# Patient Record
Sex: Female | Born: 1940 | Race: White | Hispanic: No | Smoking: Former smoker
Health system: Southern US, Community
[De-identification: ages and names within clinical notes are randomized; demographics above are authoritative.]

## PROBLEM LIST (undated history)

## (undated) DIAGNOSIS — Z85828 Personal history of other malignant neoplasm of skin: Secondary | ICD-10-CM

## (undated) DIAGNOSIS — I10 Essential (primary) hypertension: Secondary | ICD-10-CM

## (undated) DIAGNOSIS — F329 Major depressive disorder, single episode, unspecified: Secondary | ICD-10-CM

## (undated) DIAGNOSIS — M25861 Other specified joint disorders, right knee: Secondary | ICD-10-CM

## (undated) DIAGNOSIS — Z972 Presence of dental prosthetic device (complete) (partial): Secondary | ICD-10-CM

## (undated) DIAGNOSIS — E785 Hyperlipidemia, unspecified: Secondary | ICD-10-CM

## (undated) DIAGNOSIS — J45909 Unspecified asthma, uncomplicated: Secondary | ICD-10-CM

## (undated) DIAGNOSIS — K589 Irritable bowel syndrome without diarrhea: Secondary | ICD-10-CM

## (undated) DIAGNOSIS — Z9889 Other specified postprocedural states: Secondary | ICD-10-CM

## (undated) DIAGNOSIS — Z9109 Other allergy status, other than to drugs and biological substances: Secondary | ICD-10-CM

## (undated) DIAGNOSIS — Z87440 Personal history of urinary (tract) infections: Secondary | ICD-10-CM

## (undated) DIAGNOSIS — N393 Stress incontinence (female) (male): Secondary | ICD-10-CM

## (undated) DIAGNOSIS — L309 Dermatitis, unspecified: Secondary | ICD-10-CM

## (undated) DIAGNOSIS — J309 Allergic rhinitis, unspecified: Secondary | ICD-10-CM

## (undated) DIAGNOSIS — D494 Neoplasm of unspecified behavior of bladder: Secondary | ICD-10-CM

## (undated) DIAGNOSIS — M797 Fibromyalgia: Secondary | ICD-10-CM

## (undated) DIAGNOSIS — F419 Anxiety disorder, unspecified: Secondary | ICD-10-CM

## (undated) DIAGNOSIS — M1990 Unspecified osteoarthritis, unspecified site: Secondary | ICD-10-CM

## (undated) DIAGNOSIS — K219 Gastro-esophageal reflux disease without esophagitis: Secondary | ICD-10-CM

## (undated) DIAGNOSIS — M810 Age-related osteoporosis without current pathological fracture: Secondary | ICD-10-CM

## (undated) DIAGNOSIS — R112 Nausea with vomiting, unspecified: Secondary | ICD-10-CM

## (undated) DIAGNOSIS — F32A Depression, unspecified: Secondary | ICD-10-CM

## (undated) DIAGNOSIS — Z8673 Personal history of transient ischemic attack (TIA), and cerebral infarction without residual deficits: Secondary | ICD-10-CM

## (undated) HISTORY — DX: Major depressive disorder, single episode, unspecified: F32.9

## (undated) HISTORY — DX: Unspecified osteoarthritis, unspecified site: M19.90

## (undated) HISTORY — DX: Depression, unspecified: F32.A

## (undated) HISTORY — DX: Gastro-esophageal reflux disease without esophagitis: K21.9

## (undated) HISTORY — DX: Essential (primary) hypertension: I10

## (undated) HISTORY — PX: JOINT REPLACEMENT: SHX530

## (undated) HISTORY — PX: CATARACT EXTRACTION W/ INTRAOCULAR LENS  IMPLANT, BILATERAL: SHX1307

## (undated) HISTORY — PX: TONSILLECTOMY: SUR1361

## (undated) HISTORY — DX: Hyperlipidemia, unspecified: E78.5

---
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
## 1999-09-08 HISTORY — PX: KNEE ARTHROSCOPY: SUR90

## 2005-02-09 ENCOUNTER — Encounter: Payer: Self-pay | Admitting: Gastroenterology

## 2007-08-15 ENCOUNTER — Encounter: Payer: Self-pay | Admitting: Gastroenterology

## 2009-07-21 ENCOUNTER — Emergency Department (HOSPITAL_COMMUNITY): Admission: EM | Admit: 2009-07-21 | Discharge: 2009-07-22 | Payer: Self-pay | Admitting: Emergency Medicine

## 2009-08-06 ENCOUNTER — Ambulatory Visit: Payer: Self-pay | Admitting: Family Medicine

## 2009-08-06 DIAGNOSIS — F329 Major depressive disorder, single episode, unspecified: Secondary | ICD-10-CM

## 2009-08-06 DIAGNOSIS — R32 Unspecified urinary incontinence: Secondary | ICD-10-CM | POA: Insufficient documentation

## 2009-08-06 DIAGNOSIS — K219 Gastro-esophageal reflux disease without esophagitis: Secondary | ICD-10-CM | POA: Insufficient documentation

## 2009-08-06 DIAGNOSIS — I10 Essential (primary) hypertension: Secondary | ICD-10-CM | POA: Insufficient documentation

## 2009-08-06 DIAGNOSIS — Z8679 Personal history of other diseases of the circulatory system: Secondary | ICD-10-CM | POA: Insufficient documentation

## 2009-08-06 DIAGNOSIS — F32A Depression, unspecified: Secondary | ICD-10-CM | POA: Insufficient documentation

## 2009-08-06 DIAGNOSIS — E785 Hyperlipidemia, unspecified: Secondary | ICD-10-CM | POA: Insufficient documentation

## 2009-08-07 ENCOUNTER — Telehealth: Payer: Self-pay | Admitting: Family Medicine

## 2009-08-20 ENCOUNTER — Ambulatory Visit: Payer: Self-pay | Admitting: Family Medicine

## 2009-08-22 ENCOUNTER — Telehealth: Payer: Self-pay | Admitting: Family Medicine

## 2009-08-28 ENCOUNTER — Ambulatory Visit: Payer: Self-pay | Admitting: Family Medicine

## 2009-08-29 ENCOUNTER — Encounter: Payer: Self-pay | Admitting: Family Medicine

## 2009-09-02 ENCOUNTER — Encounter: Payer: Self-pay | Admitting: Family Medicine

## 2009-09-02 ENCOUNTER — Ambulatory Visit: Payer: Self-pay

## 2009-09-05 ENCOUNTER — Ambulatory Visit: Payer: Self-pay | Admitting: Family Medicine

## 2009-09-19 ENCOUNTER — Ambulatory Visit: Payer: Self-pay | Admitting: Family Medicine

## 2009-09-20 ENCOUNTER — Encounter (INDEPENDENT_AMBULATORY_CARE_PROVIDER_SITE_OTHER): Payer: Self-pay | Admitting: *Deleted

## 2009-09-25 ENCOUNTER — Telehealth: Payer: Self-pay | Admitting: Family Medicine

## 2009-09-30 ENCOUNTER — Encounter: Payer: Self-pay | Admitting: Family Medicine

## 2009-10-01 ENCOUNTER — Ambulatory Visit: Payer: Self-pay | Admitting: Family Medicine

## 2009-10-01 ENCOUNTER — Telehealth: Payer: Self-pay | Admitting: Family Medicine

## 2009-10-01 DIAGNOSIS — L240 Irritant contact dermatitis due to detergents: Secondary | ICD-10-CM | POA: Insufficient documentation

## 2009-10-03 ENCOUNTER — Telehealth (INDEPENDENT_AMBULATORY_CARE_PROVIDER_SITE_OTHER): Payer: Self-pay | Admitting: *Deleted

## 2009-10-03 LAB — CONVERTED CEMR LAB
Albumin: 3.9 g/dL (ref 3.5–5.2)
BUN: 11 mg/dL (ref 6–23)
Basophils Relative: 0.5 % (ref 0.0–3.0)
Bilirubin, Direct: 0 mg/dL (ref 0.0–0.3)
CO2: 30 meq/L (ref 19–32)
Calcium: 9.5 mg/dL (ref 8.4–10.5)
Chloride: 101 meq/L (ref 96–112)
Cholesterol: 136 mg/dL (ref 0–200)
Creatinine, Ser: 0.7 mg/dL (ref 0.4–1.2)
GFR calc non Af Amer: 87.8 mL/min (ref >60)
Glucose, Bld: 78 mg/dL (ref 70–99)
Iron: 61 ug/dL (ref 42–145)
LDL Cholesterol: 72 mg/dL (ref 0–99)
MCV: 96.5 fL (ref 78.0–100.0)
Neutro Abs: 5.4 10*3/uL (ref 1.4–7.7)
Platelets: 290 10*3/uL (ref 150.0–400.0)
Potassium: 3.9 meq/L (ref 3.5–5.1)
RBC: 3.89 M/uL (ref 3.87–5.11)
T3, Free: 2.6 pg/mL (ref 2.3–4.2)
Total Bilirubin: 0.4 mg/dL (ref 0.3–1.2)
Total Protein: 7 g/dL (ref 6.0–8.3)
VLDL: 14.8 mg/dL (ref 0.0–40.0)

## 2009-10-08 ENCOUNTER — Ambulatory Visit: Payer: Self-pay | Admitting: Gastroenterology

## 2009-10-08 LAB — HM COLONOSCOPY

## 2009-10-28 ENCOUNTER — Telehealth: Payer: Self-pay | Admitting: Family Medicine

## 2009-11-01 ENCOUNTER — Encounter: Payer: Self-pay | Admitting: Family Medicine

## 2009-11-05 ENCOUNTER — Ambulatory Visit: Payer: Self-pay | Admitting: Family Medicine

## 2010-01-23 ENCOUNTER — Telehealth: Payer: Self-pay | Admitting: *Deleted

## 2010-01-28 ENCOUNTER — Ambulatory Visit: Payer: Self-pay | Admitting: Family Medicine

## 2010-01-30 ENCOUNTER — Telehealth: Payer: Self-pay | Admitting: Internal Medicine

## 2010-02-06 ENCOUNTER — Ambulatory Visit (HOSPITAL_COMMUNITY): Admission: RE | Admit: 2010-02-06 | Discharge: 2010-02-06 | Payer: Self-pay | Admitting: Family Medicine

## 2010-02-06 LAB — HM MAMMOGRAPHY

## 2010-03-18 ENCOUNTER — Ambulatory Visit: Payer: Self-pay | Admitting: Family Medicine

## 2010-03-18 DIAGNOSIS — K1321 Leukoplakia of oral mucosa, including tongue: Secondary | ICD-10-CM | POA: Insufficient documentation

## 2010-10-05 LAB — CONVERTED CEMR LAB
BUN: 14 mg/dL (ref 6–23)
Basophils Relative: 0.4 % (ref 0.0–3.0)
CO2: 30 meq/L (ref 19–32)
Calcium: 9.6 mg/dL (ref 8.4–10.5)
Chloride: 100 meq/L (ref 96–112)
Cholesterol: 182 mg/dL (ref 0–200)
Eosinophils Relative: 5.5 % — ABNORMAL HIGH (ref 0.0–5.0)
Glucose, Bld: 76 mg/dL (ref 70–99)
HDL: 50.2 mg/dL (ref >39.00)
LDL Cholesterol: 110 mg/dL — ABNORMAL HIGH (ref 0–99)
Lymphocytes Relative: 32.4 % (ref 12.0–46.0)
Lymphs Abs: 2.3 10*3/uL (ref 0.7–4.0)
MCV: 93.9 fL (ref 78.0–100.0)
Monocytes Absolute: 0.6 10*3/uL (ref 0.1–1.0)
Neutro Abs: 3.9 10*3/uL (ref 1.4–7.7)
Neutrophils Relative %: 54.1 % (ref 43.0–77.0)
Platelets: 307 10*3/uL (ref 150.0–400.0)
Potassium: 4.5 meq/L (ref 3.5–5.1)
RBC: 4.14 M/uL (ref 3.87–5.11)
RDW: 14.7 % — ABNORMAL HIGH (ref 11.5–14.6)
TSH: 0.83 microintl units/mL (ref 0.35–5.50)
Total CHOL/HDL Ratio: 4
Total Protein: 6.9 g/dL (ref 6.0–8.3)

## 2010-10-07 NOTE — Assessment & Plan Note (Signed)
Summary: 2 wk rov/njr   Vital Signs:  Patient profile:   70 year old female Menstrual status:  hysterectomy Weight:      166 pounds Pulse rate:   94 / minute BP sitting:   112 / 64  (left arm)  Vitals Entered By: Doristine Devoid (September 19, 2009 10:23 AM) CC: 2 wk roa-bp   CC:  2 wk roa-bp.  History of Present Illness: Zafira is a 70 year old, married female, nonsmoker, who comes in today for evaluation of two problems.  She has underlying hypertension, which we had had some medication to get her blood pressure down.  We did a renal artery scan to be sure she did not have renal artery stenosis, which she does not area.  She is currently on Zestril 40 mg daily, hydrochlorothiazide 25 mg daily, amlodipine 5 mg b.i.d., potassium 20 mEq daily.  BP 112/64 to 3, times a week she's having lightheadedness when she stands up.  No side effects from medication.  She is also complaining of mid sternal burning.  It wakes her up at two or 3 o'clock in the morning.  She is also having difficulty swallowing and feels that food gets stuck in her distal esophagus.  She had a colonoscopy prior to moving to [facility] in [place], which was normal.  Allergies: 1)  ! Codeine 2)  ! Sulfa 3)  ! Cipro  Past History:  Past medical, surgical, family and social histories (including risk factors) reviewed for relevance to current acute and chronic problems.  Past Medical History: Reviewed history from 08/06/2009 and no changes required. Depression GERD Hyperlipidemia Hypertension Cerebrovascular accident, hx of Urinary incontinence arthritis chicken pox faiting spells ulcers arrhythmia hx uti  Past Surgical History: Reviewed history from 08/06/2009 and no changes required. Cataract extraction Hysterectomy right foot surgery lipoma on stomach  Family History: Reviewed history from 08/06/2009 and no changes required. Father: DM, heart disease, stroke Mother: pancreatic cancer - died  age 43 Siblings: none Children: 1 boy 1 girl - healthy  Social History: Reviewed history from 08/06/2009 and no changes required. Retired Married Alcohol use-yes Drug use-no  Review of Systems      See HPI  Physical Exam  General:  Well-developed,well-nourished,in no acute distress; alert,appropriate and cooperative throughout examination   Impression & Recommendations:  Problem # 1:  HYPERTENSION (ICD-401.9) Assessment Improved  Her updated medication list for this problem includes:    Zestril 40 Mg Tabs (Lisinopril) .Marland Kitchen... Take 1 tablet by mouth every morning    Amlodipine Besylate 5 Mg Tabs (Amlodipine besylate) .Marland Kitchen... Take 1 tablet by mouth two times a day    Hydrochlorothiazide 25 Mg Tabs (Hydrochlorothiazide) .Marland Kitchen... Take 1 tablet by mouth every morning  Orders: Prescription Created Electronically [phone])  Problem # 2:  GERD (ICD-530.81) Assessment: Deteriorated  Orders: Gastroenterology Referral (GI) Prescription Created Electronically [phone])  Complete Medication List: 1)  Plavix 75 Mg Tabs (Clopidogrel bisulfate) .... Take one tab once daily 2)  Lovastatin 40 Mg Tabs (Lovastatin) .... Take one tab once daily 3)  Aspir-low 81 Mg Tbec (Aspirin) .... Take one tab once daily 4)  Fluoxetine Hcl 20 Mg Caps (Fluoxetine hcl) .... Take one cap once daily 5)  Zestril 40 Mg Tabs (Lisinopril) .... Take 1 tablet by mouth every morning 6)  Amlodipine Besylate 5 Mg Tabs (Amlodipine besylate) .... Take 1 tablet by mouth two times a day 7)  Lorazepam 0.5 Mg Tabs (Lorazepam) .... Take one tab by mouth once daily 8)  Hydrochlorothiazide 25  Mg Tabs (Hydrochlorothiazide) .... Take 1 tablet by mouth every morning 9)  Tessalon Perles 100 Mg Caps (Benzonatate) .Marland Kitchen.. 1 or 2 three times a day as needed cough 10)  Potassium Chloride Cr 10 Meq Cr-caps (Potassium chloride) .... 2 by mouth qam  Patient Instructions: 1)  decrease the amlodipine to one tablet daily.  Check your blood pressure  in the morning daily.  The goal is to keep her systolic between 664 and 140 and diastolic between 80 and 90. 2)  Take OTC Prilosec 20 mg daily, to your food slowly avoid hotdogs, and steak, or other foods that are hard to chew.  Nothing to eat for two hours prior to bedtime elevate y  head with two pillows.  I will call GI  for consult

## 2010-10-07 NOTE — Progress Notes (Signed)
Summary: Triage (Call a Nurse)  Phone Note Outgoing Call   Summary of Call: Pt called Call A NURSE last night with itchy lesion on cheek and around eye.  Was advised to see Dr. Tawanna Cooler within 24 hours.  Called pt's home and left message to call for an appt today if symptomatic.  Call placed by: Lynann Beaver CMA,  October 01, 2009 8:40 AM Call placed to: Patient Summary of Call: Called pt to see if she needs an appt for itchy area on face that she called in to talk to Call A NURSE last night.  Left message to call us back for appt.  Original note will be scanned.  Initial call taken by: Lynann Beaver CMA,  October 01, 2009 8:41 AM  Follow-up for Phone Call        Phone call completed-----Pt called in and adv that she was asyptomatic (exp no symptoms at this time)  but would c/b for appt w/ Dr Tawanna Cooler if condition came back. Follow-up by: Debbra Riding,  October 01, 2009 8:45 AM

## 2010-10-07 NOTE — Procedures (Signed)
Summary: EGD & Diagnostic Colonoscopy/[facility]  EGD & Diagnostic Colonoscopy/[facility]   Imported By: Sherian Rein 10/10/2009 09:30:33  _____________________________________________________________________  External Attachment:    Type:   Image     Comment:   External Document

## 2010-10-07 NOTE — Progress Notes (Signed)
Summary: rx  Phone Note Refill Request Message from:  Fax from Pharmacy on Jan 23, 2010 11:56 AM  Refills Requested: Medication #1:  ZESTRIL 40 MG TABS Take 1 tablet by mouth every morning Initial call taken by: Kern Reap CMA Duncan Dull),  Jan 23, 2010 11:56 AM    Prescriptions: ZESTRIL 40 MG TABS (LISINOPRIL) Take 1 tablet by mouth every morning  #90 x 3   Entered by:   Kern Reap CMA (AAMA)   Authorized by:   Roderick Pee MD   Signed by:   Kern Reap CMA (AAMA) on 01/23/2010   Method used:   Faxed to ...       [facility] education [facility])       [address] [address]       [place], [place]  [address]       Ph: [phone]       Fax: [phone]   RxID:   [id number]

## 2010-10-07 NOTE — Letter (Signed)
Summary: [facility]   Imported By: Maryln Gottron 10/03/2009 11:27:19  _____________________________________________________________________  External Attachment:    Type:   Image     Comment:   External Document

## 2010-10-07 NOTE — Progress Notes (Signed)
Summary: REFILL REQ  Phone Note Call from Patient   Caller: Patient [phone] Reason for Call: Refill Medication Summary of Call: Pt would like to have a RX for meds: Hydrochlorothiazide 25 Mg Tabs  -and-  Potassium Chloride Cr 10 Meq Cr-caps   sent to [facility] (90 day supply).  Initial call taken by: Debbra Riding,  October 28, 2009 9:53 AM    Prescriptions: POTASSIUM CHLORIDE CR 10 MEQ CR-CAPS (POTASSIUM CHLORIDE) 2 by mouth qam  #200 x 3   Entered by:   Kern Reap CMA (AAMA)   Authorized by:   Roderick Pee MD   Signed by:   Kern Reap CMA (AAMA) on 10/28/2009   Method used:   Faxed to ...       [facility] education [facility])       [address] [address]       [place], [place]  [address]       Ph: [phone]       Fax: [phone]   RxID:   [id number] HYDROCHLOROTHIAZIDE 25 MG TABS (HYDROCHLOROTHIAZIDE) Take 1 tablet by mouth every morning  #100 x 3   Entered by:   Kern Reap CMA (AAMA)   Authorized by:   Roderick Pee MD   Signed by:   Kern Reap CMA (AAMA) on 10/28/2009   Method used:   Faxed to ...       [facility] Environmental education officer)       [address] [address]       [place], [place]  [address]       Ph: [phone]       Fax: [phone]   RxID:   [id number]

## 2010-10-07 NOTE — Assessment & Plan Note (Signed)
Summary: pt will come in fasting/njr/pt rsc/cjr   Vital Signs:  Patient profile:   70 year old female Menstrual status:  hysterectomy Height:      61 inches Weight:      161 pounds Temp:     98.5 degrees F oral BP sitting:   120 / 80  (left arm) Cuff size:   regular  Vitals Entered By: Kern Reap CMA Duncan Dull) (Jan 28, 2010 10:56 AM)  Primary Care Provider:  Kelle Darting, MD   History of Present Illness: Marie Harrison is a 70 year old, married female, nonsmoker, who comes in today for evaluation of multiple problems.  She takes lovastatin 40 mg daily for hyperlipidemia.  Will check lipid panel.  She takes Prozac 20 mg nightly for depression.  She takes Zestril 40 mg daily for hypertension, under poor thiazide 25 mg daily, an two, potassium supplements.  BP 120/80.  She takes flurazepam, .5 nightly, p.r.n. for anxiet she takes Prilosec 40 mg daily for reflux esophagitis.  She was also given Celebrex 10 mg daily for soreness in her foot.  She does see Dr. Toni Arthurs for foot evaluation.  Recommend she stop the Celebrex use Motri b.i.d.  She gets routine eye care.  Dental care does BSE monthly and gets any mammography.  Colonoscopy normal.  Seasonal flu 2010 Pneumovax 2006 shingles 2009.  We will give her a tetanus booster today.  Her past history social history, family history in detail.  No significant changes.  Her physical activities are limited because of her foot pain.  Her mood is good on medication.  Hearing normal ADLs.  Normal fall risk.  None home safety.  Normal height, weight, normal.  Visual acuity checked by ophthalmologist.  Appropriate labs today.  Allergies: 1)  ! Codeine 2)  ! Sulfa 3)  ! Cipro  Past History:  Past medical, surgical, family and social histories (including risk factors) reviewed, and no changes noted (except as noted below).  Past Medical History: Reviewed history from 10/08/2009 and no changes  required. Depression GERD Hyperlipidemia Hypertension Cerebrovascular accident, hx of Urinary incontinence arthritis chicken pox faiting spells ulcers arrhythmia hx uti   Past Surgical History: Reviewed history from 10/08/2009 and no changes required. Cataract extraction Hysterectomy right foot surgery  Family History: Reviewed history from 10/08/2009 and no changes required. Father: DM, heart disease, stroke Mother: pancreatic cancer - died age 56 Siblings: none Children: 1 boy 1 girl - healthy    Social History: Reviewed history from 10/08/2009 and no changes required. Retired Runner, broadcasting/film/video Married 2 children Alcohol use-yes Drug use-no   1 caffinated beverage a day  Review of Systems      See HPI  Physical Exam  General:  Well-developed,well-nourished,in no acute distress; alert,appropriate and cooperative throughout examination Head:  Normocephalic and atraumatic without obvious abnormalities. No apparent alopecia or balding. Eyes:  No corneal or conjunctival inflammation noted. EOMI. Perrla. Funduscopic exam benign, without hemorrhages, exudates or papilledema. Vision grossly normal. Ears:  External ear exam shows no significant lesions or deformities.  Otoscopic examination reveals clear canals, tympanic membranes are intact bilaterally without bulging, retraction, inflammation or discharge. Hearing is grossly normal bilaterally. Nose:  External nasal examination shows no deformity or inflammation. Nasal mucosa are pink and moist without lesions or exudates. Mouth:  Oral mucosa and oropharynx without lesions or exudates.  Teeth in good repair. Neck:  No deformities, masses, or tenderness noted. Chest Wall:  No deformities, masses, or tenderness noted. Breasts:  No mass, nodules, thickening, tenderness, bulging,  retraction, inflamation, nipple discharge or skin changes noted.   Lungs:  Normal respiratory effort, chest expands symmetrically. Lungs are clear to  auscultation, no crackles or wheezes. Heart:  Normal rate and regular rhythm. S1 and S2 normal without gallop, murmur, click, rub or other extra sounds. Abdomen:  Bowel sounds positive,abdomen soft and non-tender without masses, organomegaly or hernias noted. Rectal:  No external abnormalities noted. Normal sphincter tone. No rectal masses or tenderness. Genitalia:  Pelvic Exam:        External: normal female genitalia without lesions or masses        Vagina: normal without lesions or masses        Cervix: normal without lesions or masses        Adnexa: normal bimanual exam without masses or fullness        Uterus: normal by palpation        Pap smear: not performed Msk:  No deformity or scoliosis noted of thoracic or lumbar spine.   Pulses:  R and L carotid,radial,femoral,dorsalis pedis and posterior tibial pulses are full and equal bilaterally Extremities:  No clubbing, cyanosis, edema, or deformity noted with normal full range of motion of all joints.   Neurologic:  No cranial nerve deficits noted. Station and gait are normal. Plantar reflexes are down-going bilaterally. DTRs are symmetrical throughout. Sensory, motor and coordinative functions appear intact. Skin:  total body skin exam normal.  She has a very large S. K. on her left breast.  This darkly pigmented, however, is not changed in many years Cervical Nodes:  No lymphadenopathy noted Axillary Nodes:  No palpable lymphadenopathy Inguinal Nodes:  No significant adenopathy Psych:  Cognition and judgment appear intact. Alert and cooperative with normal attention span and concentration. No apparent delusions, illusions, hallucinations   Impression & Recommendations:  Problem # 1:  HYPERTENSION (ICD-401.9) Assessment Improved  Her updated medication list for this problem includes:    Zestril 40 Mg Tabs (Lisinopril) .Marland Kitchen... Take 1 tablet by mouth every morning    Hydrochlorothiazide 25 Mg Tabs (Hydrochlorothiazide) .Marland Kitchen... Take 1  tablet by mouth every morning  Orders: Venipuncture ([address]) TLB-Lipid Panel (80061-LIPID) TLB-BMP (Basic Metabolic Panel-BMET) (80048-METABOL) TLB-CBC Platelet - w/Differential (85025-CBCD) TLB-Hepatic/Liver Function Pnl (80076-HEPATIC) TLB-TSH (Thyroid Stimulating Hormone) (84443-TSH) UA Dipstick w/o Micro (automated)  (81003) EKG w/ Interpretation (93000)  Problem # 2:  HYPERLIPIDEMIA (ICD-272.4) Assessment: Improved  Her updated medication list for this problem includes:    Lovastatin 40 Mg Tabs (Lovastatin) .Marland Kitchen... Take one tab once daily  Orders: Venipuncture ([address]) TLB-Lipid Panel (80061-LIPID) TLB-BMP (Basic Metabolic Panel-BMET) (80048-METABOL) TLB-CBC Platelet - w/Differential (85025-CBCD) TLB-Hepatic/Liver Function Pnl (80076-HEPATIC) TLB-TSH (Thyroid Stimulating Hormone) (84443-TSH) UA Dipstick w/o Micro (automated)  (81003) EKG w/ Interpretation (93000)  Problem # 3:  GERD (ICD-530.81) Assessment: Improved  Her updated medication list for this problem includes:    Prilosec 40 Mg Cpdr (Omeprazole) ..... One tablet by mouth once daily  Orders: Venipuncture ([address]) TLB-Lipid Panel (80061-LIPID) TLB-BMP (Basic Metabolic Panel-BMET) (80048-METABOL) TLB-CBC Platelet - w/Differential (85025-CBCD) TLB-Hepatic/Liver Function Pnl (80076-HEPATIC) TLB-TSH (Thyroid Stimulating Hormone) (84443-TSH) UA Dipstick w/o Micro (automated)  (81003)  Problem # 4:  DEPRESSION (ICD-311) Assessment: Improved  Her updated medication list for this problem includes:    Fluoxetine Hcl 20 Mg Caps (Fluoxetine hcl) .Marland Kitchen... Take one cap once daily    Lorazepam 0.5 Mg Tabs (Lorazepam) .Marland Kitchen... Take one tab by mouth once daily as needed  Orders: Venipuncture (91478) TLB-Lipid Panel (80061-LIPID) TLB-BMP (Basic Metabolic Panel-BMET) (80048-METABOL) TLB-CBC Platelet -  w/Differential (85025-CBCD) TLB-Hepatic/Liver Function Pnl (80076-HEPATIC) TLB-TSH (Thyroid Stimulating Hormone)  (84443-TSH) UA Dipstick w/o Micro (automated)  (81003)  Problem # 5:  Preventive Health Care (ICD-V70.0) Assessment: Unchanged  Complete Medication List: 1)  Lovastatin 40 Mg Tabs (Lovastatin) .... Take one tab once daily 2)  Fluoxetine Hcl 20 Mg Caps (Fluoxetine hcl) .... Take one cap once daily 3)  Zestril 40 Mg Tabs (Lisinopril) .... Take 1 tablet by mouth every morning 4)  Lorazepam 0.5 Mg Tabs (Lorazepam) .... Take one tab by mouth once daily as needed 5)  Hydrochlorothiazide 25 Mg Tabs (Hydrochlorothiazide) .... Take 1 tablet by mouth every morning 6)  Tessalon Perles 100 Mg Caps (Benzonatate) .Marland Kitchen.. 1 or 2 three times a day as needed cough 7)  Potassium Chloride Cr 10 Meq Cr-caps (Potassium chloride) .... 2 by mouth qam 8)  Prilosec 40 Mg Cpdr (Omeprazole) .... One tablet by mouth once daily 9)  Celebrex 200 Mg Caps (Celecoxib) .... Take one tab by mouth once daily  Other Orders: TD Toxoids IM 7 YR + (10272) Admin 1st Vaccine (53664)  Patient Instructions: 1)  continue current medications with the exception of stopping the Celebrex take Motrin 600 mg twice a day with food.  Call Dr. Toni Arthurs for specialist for consultation 2)  Please schedule a follow-up appointment in 1 year. Prescriptions: PRILOSEC 40 MG CPDR (OMEPRAZOLE) one tablet by mouth once daily  #100 x 3   Entered and Authorized by:   Roderick Pee MD   Signed by:   Roderick Pee MD on 01/28/2010   Method used:   Print then Give to Patient   RxID:   [id number] POTASSIUM CHLORIDE CR 10 MEQ CR-CAPS (POTASSIUM CHLORIDE) 2 by mouth qam  #200 x 3   Entered and Authorized by:   Roderick Pee MD   Signed by:   Roderick Pee MD on 01/28/2010   Method used:   Print then Give to Patient   RxID:   [id number] HYDROCHLOROTHIAZIDE 25 MG TABS (HYDROCHLOROTHIAZIDE) Take 1 tablet by mouth every morning  #100 x 3   Entered and Authorized by:   Roderick Pee MD   Signed by:   Roderick Pee MD on 01/28/2010    Method used:   Print then Give to Patient   RxID:   [id number] LORAZEPAM 0.5 MG TABS (LORAZEPAM) take one tab by mouth once daily as needed  #100 x 3   Entered and Authorized by:   Roderick Pee MD   Signed by:   Roderick Pee MD on 01/28/2010   Method used:   Print then Give to Patient   RxID:   [id number] ZESTRIL 40 MG TABS (LISINOPRIL) Take 1 tablet by mouth every morning  #100 x 3   Entered and Authorized by:   Roderick Pee MD   Signed by:   Roderick Pee MD on 01/28/2010   Method used:   Print then Give to Patient   RxID:   [id number] FLUOXETINE HCL 20 MG CAPS (FLUOXETINE HCL) take one cap once daily  #100 x 3   Entered and Authorized by:   Roderick Pee MD   Signed by:   Roderick Pee MD on 01/28/2010   Method used:   Print then Give to Patient   RxID:   [id number] LOVASTATIN 40 MG TABS (LOVASTATIN) take one tab once daily  #100 x 3   Entered and Authorized by:   Roderick Pee MD  Signed by:   Roderick Pee MD on 01/28/2010   Method used:   Print then Give to Patient   RxID:   [id number]    Immunization History:  Pneumovax Immunization History:    Pneumovax:  historical (09/07/2004)  Zostavax History:    Zostavax # 1:  zostavax (09/08/2007)  Immunizations Administered:  Tetanus Vaccine:    Vaccine Type: Td    Site: right deltoid    Mfr: Sanofi Pasteur    Dose: 0.5 ml    Route: IM    Given by: Kern Reap CMA (AAMA)    Exp. Date: 09/20/2011    Lot #: [id number]    Physician counseled: yes   Appended Document: Orders Update    Clinical Lists Changes  Observations: Added new observation of COMMENTS: Marie Harrison, CMA  Jan 28, 2010 3:45 PM  (01/28/2010 15:44) Added new observation of PH URINE: 7.0  (01/28/2010 15:44) Added new observation of SPEC GR URIN: 1.015  (01/28/2010 15:44) Added new observation of APPEARANCE U: Clear  (01/28/2010 15:44) Added new observation of UA COLOR: yellow  (01/28/2010 15:44) Added  new observation of WBC DIPSTK U: trace  (01/28/2010 15:44) Added new observation of NITRITE URN: negative  (01/28/2010 15:44) Added new observation of UROBILINOGEN: 0.2  (01/28/2010 15:44) Added new observation of PROTEIN, URN: negative  (01/28/2010 15:44) Added new observation of BLOOD UR DIP: negative  (01/28/2010 15:44) Added new observation of KETONES URN: negative  (01/28/2010 15:44) Added new observation of BILIRUBIN UR: negative  (01/28/2010 15:44) Added new observation of GLUCOSE, URN: negative  (01/28/2010 15:44)      Laboratory Results   Urine Tests  Date/Time Recieved: Jan 28, 2010 3:45 PM  Date/Time Reported: Jan 28, 2010 3:45 PM   Routine Urinalysis   Color: yellow Appearance: Clear Glucose: negative   (Normal Range: Negative) Bilirubin: negative   (Normal Range: Negative) Ketone: negative   (Normal Range: Negative) Spec. Gravity: 1.015   (Normal Range: 1.003-1.035) Blood: negative   (Normal Range: Negative) pH: 7.0   (Normal Range: 5.0-8.0) Protein: negative   (Normal Range: Negative) Urobilinogen: 0.2   (Normal Range: 0-1) Nitrite: negative   (Normal Range: Negative) Leukocyte Esterace: trace   (Normal Range: Negative)    Comments: Marie Harrison, CMA  Jan 28, 2010 3:45 PM

## 2010-10-07 NOTE — Letter (Signed)
Summary: New Patient letter  [facility]  [address] [place], [place] [address]   Phone: [phone]  Fax: [phone]       09/20/2009 MRN: [id number]  Marie Harrison [address] [place], [place]  [address]  Dear Marie Harrison,  Welcome to the Gastroenterology Division at [facility].    You are scheduled to see Dr.  Rob Bunting on October 08, 2009 at 8:30am on the 3rd floor at [facility], [address]  We ask that you try to arrive at our office 15 minutes prior to your appointment time to allow for check-in.  We would like you to complete the enclosed self-administered evaluation form prior to your visit and bring it with you on the day of your appointment.  We will review it with you.  Also, please bring a complete list of all your medications or, if you prefer, bring the medication bottles and we will list them.  Please bring your insurance card so that we may make a copy of it.  If your insurance requires a referral to see a specialist, please bring your referral form from your primary care physician.  Co-payments are due at the time of your visit and may be paid by cash, check or credit card.     Your office visit will consist of a consult with your physician (includes a physical exam), any laboratory testing he/she may order, scheduling of any necessary diagnostic testing (e.g. x-ray, ultrasound, CT-scan), and scheduling of a procedure (e.g. Endoscopy, Colonoscopy) if required.  Please allow enough time on your schedule to allow for any/all of these possibilities.    If you cannot keep your appointment, please call [phone] to cancel or reschedule prior to your appointment date.  This allows [place] the opportunity to schedule an appointment for another patient in need of care.  If you do not cancel or reschedule by 5 p.m. the business day prior to your appointment date, you will be charged a $50.00 late cancellation/no-show fee.    Thank you for  choosing [facility] for your medical needs.  We appreciate the opportunity to care for you.  Please visit [place] at our website  to learn more about our practice.                     Sincerely,                                                             The Gastroenterology Division

## 2010-10-07 NOTE — Medication Information (Signed)
Summary: Potassium Chloride Approved/[facility]  Potassium Chloride Approved/[facility]   Imported By: Sherian Rein 11/07/2009 12:07:56  _____________________________________________________________________  External Attachment:    Type:   Image     Comment:   External Document

## 2010-10-07 NOTE — Assessment & Plan Note (Signed)
Summary: 1 month rov/njr pt rsc/njr   Vital Signs:  Patient profile:   70 year old female Menstrual status:  hysterectomy Weight:      165 pounds BMI:     32.34 Temp:     97.9 degrees F oral BP sitting:   126 / 76  (left arm) Cuff size:   regular  Vitals Entered By: Raechel Ache, RN (November 05, 2009 10:14 AM) CC: 1 mo ROV- still having itching on 1/2 dose prednisone.   Primary Care Provider:  Kelle Darting, MD  CC:  1 mo ROV- still having itching on 1/2 dose prednisone.Marland Kitchen  History of Present Illness: Marie Harrison is a 70 year old female, married, nonsmoker, who comes in today for evaluation of hypertension.  Contact dermatitis.  Left chest wall pain.  Her hypertension is treated with Zestril 40 mg daily, hydrochlorothiazide 25 mg daily one to 20 mEq potassium supplement daily.  BP at home 130 to 140 systolic, 70 to 80 diastolic.  She's had no more episodes when her pressure.  She jumps, high, except when she eats salt.  Advised to stay on a salt free diet.  Her contact dermatitis was treated with a tapering dose of prednisone.  On the first round of flared so she started all over again.  She is now down to 10 mg a day.  She was lifting and developed some left chest wall pain.  It's a constant, dull ache, and she points to the left axillary area as the source for her pain.  She has no cardiac or pulmonary symptoms  Allergies: 1)  ! Codeine 2)  ! Sulfa 3)  ! Cipro  Past History:  Past medical, surgical, family and social histories (including risk factors) reviewed for relevance to current acute and chronic problems.  Past Medical History: Reviewed history from 10/08/2009 and no changes required. Depression GERD Hyperlipidemia Hypertension Cerebrovascular accident, hx of Urinary incontinence arthritis chicken pox faiting spells ulcers arrhythmia hx uti   Past Surgical History: Reviewed history from 10/08/2009 and no changes required. Cataract  extraction Hysterectomy right foot surgery  Family History: Reviewed history from 10/08/2009 and no changes required. Father: DM, heart disease, stroke Mother: pancreatic cancer - died age 32 Siblings: none Children: 1 boy 1 girl - healthy    Social History: Reviewed history from 10/08/2009 and no changes required. 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  Today Lungs:  Normal respiratory effort, chest expands symmetrically. Lungs are clear to auscultation, no crackles or wheezes. Heart:  150/90 Skin:  Intact without suspicious lesions or rashes   Impression & Recommendations:  Problem # 1:  CONTACT DERMATITIS&OTHER ECZEMA DUE DETERGENTS (ICD-692.0) Assessment Improved  Her updated medication list for this problem includes:    Prednisone 20 Mg Tabs (Prednisone) ..... Uad  Problem # 2:  HYPERTENSION (ICD-401.9) Assessment: Improved  Her updated medication list for this problem includes:    Zestril 40 Mg Tabs (Lisinopril) .Marland Kitchen... Take 1 tablet by mouth every morning    Hydrochlorothiazide 25 Mg Tabs (Hydrochlorothiazide) .Marland Kitchen... Take 1 tablet by mouth every morning  Complete Medication List: 1)  Lovastatin 40 Mg Tabs (Lovastatin) .... Take one tab once daily 2)  Aspir-low 81 Mg Tbec (Aspirin) .... Take one tab  once daily 3)  Fluoxetine Hcl 20 Mg Caps (Fluoxetine hcl) .... Take one cap once daily 4)  Zestril 40 Mg Tabs (Lisinopril) .... Take 1 tablet by mouth every morning 5)  Lorazepam 0.5 Mg Tabs (Lorazepam) .... Take one tab by mouth once daily 6)  Hydrochlorothiazide 25 Mg Tabs (Hydrochlorothiazide) .... Take 1 tablet by mouth every morning 7)  Tessalon Perles 100 Mg Caps (Benzonatate) .Marland Kitchen.. 1  or 2 three times a day as needed cough 8)  Potassium Chloride Cr 10 Meq Cr-caps (Potassium chloride) .... 2 by mouth qam 9)  Prednisone 20 Mg Tabs (Prednisone) .... Uad 10)  Prilosec 40 Mg Cpdr (Omeprazole) .... One tablet by mouth once daily  Patient Instructions: 1)  add 10 mg of Claritin plain daily.  Next Monday decrease the prednisone to 10 mg Monday, Wednesday, Friday, for a two week taper.  Continue to take the plain  10 mg of Claritin daily for 4 to 6 weeks.  If y  symptoms recur or do not resolve call your dermatologist. 2)  Continue your blood pressure medication as outlined.  Check your blood pressure once a week since it is  back to normal

## 2010-10-07 NOTE — Progress Notes (Signed)
  Faxed lab results to Dr. Porfirio Mylar Dohmeier at fax # [phone].

## 2010-10-07 NOTE — Progress Notes (Signed)
Summary: Records received  Records received from [facility]. Forwarded to Dr. Christella Hartigan for review.

## 2010-10-07 NOTE — Miscellaneous (Signed)
Summary: recall colon  Clinical Lists Changes  Observations: Added new observation of COLONNXTDUE: 02/2015 (10/08/2009 9:06)  Appended Document: recall colon recall in EMR and IDX

## 2010-10-07 NOTE — Progress Notes (Signed)
Summary: UA results  Phone Note Call from Patient   Caller: Patient Call For: Roderick Pee MD Summary of Call: Pt given normal UA results. Initial call taken by: Lynann Beaver CMA,  Jan 30, 2010 10:37 AM

## 2010-10-07 NOTE — Assessment & Plan Note (Signed)
Summary: REVIEW BIOPSIE REPORT // RS   Vital Signs:  Patient profile:   70 year old female Menstrual status:  hysterectomy Weight:      158 pounds Temp:     98.6 degrees F oral BP sitting:   122 / 70  (left arm) Cuff size:   regular  Vitals Entered By: Kathrynn Speed CMA (March 18, 2010 9:09 AM) CC: Review biopise report, src   Primary Care Provider:  Kelle Darting, MD  CC:  Review biopise report and src.  History of Present Illness: Marie Harrison is a 70 year old, married female, nonsmoker, who comes in today to review the treatment plan given to her by an oral surgeon because of A. abnormal biopsy.  She had some white spots in her palate.  Biopsy showed some hyperkeratosis.  The recommending further therapy.  She would like to see an ENT for a second opinion recommended.  Dr. Narda Bonds  Current Medications (verified): 1)  Lovastatin 40 Mg Tabs (Lovastatin) .... Take One Tab Once Daily 2)  Fluoxetine Hcl 20 Mg Caps (Fluoxetine Hcl) .... Take One Cap Once Daily 3)  Zestril 40 Mg Tabs (Lisinopril) .... Take 1 Tablet By Mouth Every Morning 4)  Lorazepam 0.5 Mg Tabs (Lorazepam) .... Take One Tab By Mouth Once Daily As Needed 5)  Hydrochlorothiazide 25 Mg Tabs (Hydrochlorothiazide) .... Take 1 Tablet By Mouth Every Morning 6)  Tessalon Perles 100 Mg Caps (Benzonatate) .Marland Kitchen.. 1 or 2 Three Times A Day As Needed Cough 7)  Potassium Chloride Cr 10 Meq Cr-Caps (Potassium Chloride) .... 2 By Mouth Qam 8)  Prilosec 40 Mg Cpdr (Omeprazole) .... One Tablet By Mouth Once Daily 9)  Advil 200 Mg Tabs (Ibuprofen) .... Twice A Day 10)  Clarinex-D 24 Hour 5-240 Mg Xr24h-Tab (Desloratadine-Pseudoephedrine) .... Once A Day  Allergies (verified): 1)  ! Codeine 2)  ! Sulfa 3)  ! Cipro  Past History:  Past medical, surgical, family and social histories (including risk factors) reviewed for relevance to current acute and chronic problems.  Past Medical History: Reviewed history from 10/08/2009 and  no changes required. Depression GERD Hyperlipidemia Hypertension Cerebrovascular accident, hx of Urinary incontinence arthritis chicken pox faiting spells ulcers arrhythmia hx uti   Past Surgical History: Reviewed history from 10/08/2009 and no changes required. Cataract extraction Hysterectomy right foot surgery  Family History: Reviewed history from 10/08/2009 and no changes required. Father: DM, heart disease, stroke Mother: pancreatic cancer - died age 75 Siblings: none Children: 1 boy 1 girl - healthy    Social History: Reviewed history from 10/08/2009 and no changes required. 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 Take one tab once daily 2)  Fluoxetine Hcl 20 Mg Caps (Fluoxetine hcl) .... Take one cap once daily 3)  Zestril 40 Mg Tabs (Lisinopril) .... Take 1 tablet by mouth every morning 4)  Lorazepam 0.5 Mg Tabs (Lorazepam) .... Take one tab by mouth once daily as needed 5)  Hydrochlorothiazide 25 Mg Tabs (Hydrochlorothiazide) .... Take 1 tablet by mouth every morning 6)  Tessalon Perles 100 Mg Caps (Benzonatate) .Marland Kitchen.. 1 or 2 three times a day as needed cough 7)  Potassium Chloride Cr 10 Meq Cr-caps (Potassium chloride) .... 2 by mouth qam 8)  Prilosec 40 Mg Cpdr (Omeprazole) .... One tablet by mouth once daily 9)  Advil 200 Mg Tabs (Ibuprofen) .... Twice a day 10)  Clarinex-d 24 Hour 5-240 Mg Xr24h-tab (Desloratadine-pseudoephedrine) .... Once a day  Patient Instructions: 1)  call Dr. Narda Bonds for a consult

## 2010-10-07 NOTE — Assessment & Plan Note (Signed)
History of Present Illness Visit Type: consult  Primary GI MD: Rob Bunting MD Primary Provider: Kelle Darting, MD Requesting Provider: Kelle Darting, MD Chief Complaint: GERD History of Present Illness:      very pleasant 70 year old womanwho recently moved to the area from [place], [place].  She has had EGDs (2006) normal EGD per report.  SB biopsies were normal.  She has partial dentures, knows she has to chew slowly and well, but still has intermittent dysphagia to solids.    recently started taking prilosec again (was on it in the past) and this helps her gassiness and may also help a bit with the dysphagia like symptoms.  she doesn't have dramatic pyrosis or other more typical GERD symptoms on or off of proton pump inhibitor. Her dysphasia is to Solids only, not progressive.  Overall weight down a bit intentionally 10 pounds in a year.  Never hematemesis, did have some dark stools around time of celebrex 2 months ago.  NSAID type meds and COX 2 inhibitors cause indigestion.   Usually only takes tylenol for pains.  Her last colonoscopy was also in 2006,              Current Medications (verified): 1)  Lovastatin 40 Mg Tabs (Lovastatin) .... Take One Tab Once Daily 2)  Aspir-Low 81 Mg Tbec (Aspirin) .... Take One Tab Once Daily 3)  Fluoxetine Hcl 20 Mg Caps (Fluoxetine Hcl) .... Take One Cap Once Daily 4)  Zestril 40 Mg Tabs (Lisinopril) .... Take 1 Tablet By Mouth Every Morning 5)  Lorazepam 0.5 Mg Tabs (Lorazepam) .... Take One Tab By Mouth Once Daily 6)  Hydrochlorothiazide 25 Mg Tabs (Hydrochlorothiazide) .... Take 1 Tablet By Mouth Every Morning 7)  Tessalon Perles 100 Mg Caps (Benzonatate) .Marland Kitchen.. 1 or 2 Three Times A Day As Needed Cough 8)  Potassium Chloride Cr 10 Meq Cr-Caps (Potassium Chloride) .... 2 By Mouth Qam 9)  Prednisone 20 Mg Tabs (Prednisone) .... Uad 10)  Prilosec 40 Mg Cpdr (Omeprazole) .... One Tablet By Mouth Once Daily  Allergies (verified): 1)  !  Codeine 2)  ! Sulfa 3)  ! 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  All other review of systems was otherwise negative.   Vital Signs:  Patient profile:   70 year old female Menstrual status:  hysterectomy Height:      60 inches Weight:      162 pounds BMI:     31.75 BSA:     1.71 Pulse rate:   88 / minute Pulse rhythm:   regular BP sitting:   120 / 78  (left arm) Cuff size:   regular  Vitals Entered By: Ok Anis CMA (October 08, 2009 8:26 AM)  Physical Exam  Additional Exam:  Constitutional: generally well appearing Psychiatric: alert and oriented times 3 Eyes: extraocular movements intact Mouth: oropharynx moist, no lesions  Neck: supple, no lymphadenopathy Cardiovascular: heart regular rate and rythm Lungs: CTA bilaterally Abdomen: soft, non-tender, non-distended, no obvious ascites, no peritoneal signs, normal bowel sounds Extremities: no lower extremity edema bilaterally Skin: no lesions on visible extremities    Impression & Recommendations:  Problem # 1:  dysphasia, likely GERD related she had an EGD 5 years ago and it was completely normal. Her intermittent, nonprogressive, solid food only dysphasia has improved since starting proton pump inhibitors one week ago. I recommended that she stay on a proton pump  inhibitor once daily 20-30 minutes before breakfast meal for the next several weeks and she will return to see me at that point. Hopefully her dysphasia is simply from swelling, edema, perhaps mild inflammation of her esophagus. If she has any residual dysphasia after that 6 week trial of proton pump inhibitor then I would schedule her for EGD. Perhaps she needs dilation. She really has no alarm symptoms and I did not mention above but I did review her recent lab work that showed normal CBC, normal complete metabolic profile.  Problem # 2:  routine risk for colon cancer we will put her in our reminder system for a colonoscopy in 2016.  Patient Instructions: 1)  Needs colonoscopy for routine screening June, 2016 (routine risk). 2)  Continue your prilosec once daily as you are already taking it. 3)  Return to see Dr. Christella Hartigan in 6 weeks, if still with intermittent swallowing issue, will proceed with EGD. 4)  A copy of this information will be sent to Dr. Tawanna Cooler. 5)  The medication list was reviewed and reconciled.  All changed / newly prescribed medications were explained.  A complete medication list was provided to the patient / caregiver.

## 2010-10-07 NOTE — Progress Notes (Signed)
Summary: BP reading  Phone Note Call from Patient   Caller: Patient Call For: Roderick Pee MD Summary of Call: Pt is calling to document recent BP readings. 127/71, 126/71, 105/67, 116/72, 117/69, 110/67, 121/73. [phone] Initial call taken by: Lynann Beaver CMA,  September 25, 2009 9:30 AM  Follow-up for Phone Call        blood pressure too low.......... decrease the amlodipine to one half tablet q.a.m. BP checked daily.  Phone report in two weeks Follow-up by: Roderick Pee MD,  September 25, 2009 9:44 AM  Additional Follow-up for Phone Call Additional follow up Details #1::        Pt given Dr. Nelida Meuse recommendations. Additional Follow-up by: Lynann Beaver CMA,  September 25, 2009 10:06 AM

## 2010-10-07 NOTE — Assessment & Plan Note (Signed)
Summary: rash//ccm   Vital Signs:  Patient profile:   70 year old female Menstrual status:  hysterectomy Weight:      166 pounds Temp:     98.9 degrees F oral BP sitting:   118 / 74  (left arm) Cuff size:   regular  Vitals Entered By: Kern Reap CMA Duncan Dull) (October 01, 2009 12:36 PM)  Reason for Visit rash and blood pressure  History of Present Illness: Marie Harrison is a 70 year old female, who comes in today for evaluation of two problems.  Her blood pressure now is under excellent control, however, it's, now it's too low.  Her systolic is running 110 diastolic running 70.  A week ago.  We decreased her Norvasc from 5 mg daily to 2 1/2 milligrams.  Daily.  Her BP is still too low.  Last Saturday.  She used Neutrogena on her face.  She's been using this product now for a couple weeks.  She then developed swelling and redness and itching of her face.  she has also gone to a neurologist ........ the neurologist is requesting diagnostic labs, which we will do today, including B12 levels  Allergies: 1)  ! Codeine 2)  ! Sulfa 3)  ! Cipro  Past History:  Past medical, surgical, family and social histories (including risk factors) reviewed for relevance to current acute and chronic problems.  Past Medical History: Reviewed history from 08/06/2009 and no changes required. Depression GERD Hyperlipidemia Hypertension Cerebrovascular accident, hx of Urinary incontinence arthritis chicken pox faiting spells ulcers arrhythmia hx uti  Past Surgical History: Reviewed history from 08/06/2009 and no changes required. Cataract extraction Hysterectomy right foot surgery lipoma on stomach  Family History: Reviewed history from 08/06/2009 and no changes required. Father: DM, heart disease, stroke Mother: pancreatic cancer - died age 76 Siblings: none Children: 1 boy 1 girl - healthy  Social History: Reviewed history from 08/06/2009 and no changes  required. Retired Married Alcohol use-yes Drug use-no  Review of Systems      See HPI  Physical Exam  General:  Well-developed,well-nourished,in no acute distress; alert,appropriate and cooperative throughout examination Skin:  redness swelling, face, consistent with a contact dermatitis   Impression & Recommendations:  Problem # 1:  HYPERTENSION (ICD-401.9) Assessment Improved  Her updated medication list for this problem includes:    Zestril 40 Mg Tabs (Lisinopril) .Marland Kitchen... Take 1 tablet by mouth every morning    Amlodipine Besylate 5 Mg Tabs (Amlodipine besylate) .Marland Kitchen... Take 1 tablet by mouth once daily    Hydrochlorothiazide 25 Mg Tabs (Hydrochlorothiazide) .Marland Kitchen... Take 1 tablet by mouth every morning  Orders: Venipuncture (16109) TLB-BMP (Basic Metabolic Panel-BMET) (80048-METABOL) TLB-CBC Platelet - w/Differential (85025-CBCD) TLB-Hepatic/Liver Function Pnl (80076-HEPATIC) TLB-TSH (Thyroid Stimulating Hormone) (84443-TSH) TLB-B12 + Folate Pnl (60454_09811-B14/NWG) TLB-IBC Pnl (Iron/FE;Transferrin) (83550-IBC) TLB-T4 (Thyrox), Free [phone]) TLB-T3, Free (Triiodothyronine) (84481-T3FREE) Prescription Created Electronically [phone])  Problem # 2:  CONTACT DERMATITIS&OTHER ECZEMA DUE DETERGENTS (ICD-692.0) Assessment: New  Her updated medication list for this problem includes:    Prednisone 20 Mg Tabs (Prednisone) ..... Uad  Orders: Venipuncture (96295) TLB-BMP (Basic Metabolic Panel-BMET) (80048-METABOL) TLB-CBC Platelet - w/Differential (85025-CBCD) TLB-Hepatic/Liver Function Pnl (80076-HEPATIC) TLB-TSH (Thyroid Stimulating Hormone) (84443-TSH) TLB-B12 + Folate Pnl (28413_24401-U27/OZD) TLB-IBC Pnl (Iron/FE;Transferrin) (83550-IBC) TLB-T4 (Thyrox), Free [phone]) TLB-T3, Free (Triiodothyronine) [id number]) Prescription Created Electronically [phone])  Complete Medication List: 1)  Lovastatin 40 Mg Tabs (Lovastatin) .... Take one tab once daily 2)   Aspir-low 81 Mg Tbec (Aspirin) .... Take one tab once daily 3)  Fluoxetine Hcl  20 Mg Caps (Fluoxetine hcl) .... Take one cap once daily 4)  Zestril 40 Mg Tabs (Lisinopril) .... Take 1 tablet by mouth every morning 5)  Amlodipine Besylate 5 Mg Tabs (Amlodipine besylate) .... Take 1 tablet by mouth once daily 6)  Lorazepam 0.5 Mg Tabs (Lorazepam) .... Take one tab by mouth once daily 7)  Hydrochlorothiazide 25 Mg Tabs (Hydrochlorothiazide) .... Take 1 tablet by mouth every morning 8)  Tessalon Perles 100 Mg Caps (Benzonatate) .Marland Kitchen.. 1 or 2 three times a day as needed cough 9)  Potassium Chloride Cr 10 Meq Cr-caps (Potassium chloride) .... 2 by mouth qam 10)  Prednisone 20 Mg Tabs (Prednisone) .... Uad  Other Orders: TLB-Lipid Panel (80061-LIPID)  Patient Instructions: 1)  stop using Neutrogena.  Begin prednisone, take one tablet daily, x 3 days, a half a tablet x 3 days, then half a tablet Monday, Wednesday, Friday, for a two week taper. 2)  Stop the Norvasc.  Continue your other blood pressure medications.  Check a morning blood pressure daily.  Return p.r.n. Prescriptions: PREDNISONE 20 MG TABS (PREDNISONE) UAD  #30 x 1   Entered and Authorized by:   Roderick Pee MD   Signed by:   Roderick Pee MD on 10/01/2009   Method used:   Electronically to        [facility] [address] [phone]* (retail)       [address]       [place], [place]  [address]       Ph: [phone] or [phone]       Fax: [phone]   RxID:   [id number]

## 2010-10-31 ENCOUNTER — Other Ambulatory Visit: Payer: Self-pay | Admitting: *Deleted

## 2010-10-31 DIAGNOSIS — I10 Essential (primary) hypertension: Secondary | ICD-10-CM

## 2010-10-31 MED ORDER — LISINOPRIL 40 MG PO TABS: 40.0000 mg | ORAL_TABLET | Freq: Every day | ORAL | Status: DC

## 2010-12-10 LAB — BASIC METABOLIC PANEL
Chloride: 100 mEq/L (ref 96–112)
Potassium: 3.1 mEq/L — ABNORMAL LOW (ref 3.5–5.1)
Sodium: 137 mEq/L (ref 135–145)

## 2010-12-10 LAB — CBC
Hemoglobin: 13.5 g/dL (ref 12.0–15.0)
MCHC: 34.6 g/dL (ref 30.0–36.0)
MCV: 94.3 fL (ref 78.0–100.0)
RBC: 4.13 MIL/uL (ref 3.87–5.11)
RDW: 14.5 % (ref 11.5–15.5)
WBC: 10.2 10*3/uL (ref 4.0–10.5)

## 2010-12-10 LAB — URINALYSIS, ROUTINE W REFLEX MICROSCOPIC
Hgb urine dipstick: NEGATIVE
Ketones, ur: NEGATIVE mg/dL
Nitrite: NEGATIVE
Specific Gravity, Urine: 1.01 (ref 1.005–1.030)
Urobilinogen, UA: 0.2 mg/dL (ref 0.0–1.0)

## 2010-12-10 LAB — URINE MICROSCOPIC-ADD ON

## 2010-12-10 LAB — DIFFERENTIAL
Basophils Relative: 1 % (ref 0–1)
Lymphs Abs: 3.1 10*3/uL (ref 0.7–4.0)
Monocytes Absolute: 0.9 10*3/uL (ref 0.1–1.0)
Neutro Abs: 6 10*3/uL (ref 1.7–7.7)
Neutrophils Relative %: 58 % (ref 43–77)

## 2010-12-10 LAB — POCT CARDIAC MARKERS: Myoglobin, poc: 73.6 ng/mL (ref 12–200)

## 2011-02-06 ENCOUNTER — Encounter: Payer: Self-pay | Admitting: Family Medicine

## 2011-02-09 ENCOUNTER — Ambulatory Visit (INDEPENDENT_AMBULATORY_CARE_PROVIDER_SITE_OTHER): Payer: Medicare Other | Admitting: Family Medicine

## 2011-02-09 ENCOUNTER — Encounter: Payer: Self-pay | Admitting: Family Medicine

## 2011-02-09 DIAGNOSIS — F3289 Other specified depressive episodes: Secondary | ICD-10-CM

## 2011-02-09 DIAGNOSIS — I10 Essential (primary) hypertension: Secondary | ICD-10-CM

## 2011-02-09 DIAGNOSIS — E785 Hyperlipidemia, unspecified: Secondary | ICD-10-CM

## 2011-02-09 DIAGNOSIS — K219 Gastro-esophageal reflux disease without esophagitis: Secondary | ICD-10-CM

## 2011-02-09 DIAGNOSIS — F329 Major depressive disorder, single episode, unspecified: Secondary | ICD-10-CM

## 2011-02-09 LAB — LIPID PANEL
Cholesterol: 150 mg/dL (ref 0–200)
HDL: 57.4 mg/dL (ref >39.00)
Total CHOL/HDL Ratio: 3
Triglycerides: 93 mg/dL (ref 0.0–149.0)
VLDL: 18.6 mg/dL (ref 0.0–40.0)

## 2011-02-09 LAB — POCT URINALYSIS DIPSTICK
Bilirubin, UA: NEGATIVE
Blood, UA: NEGATIVE
Ketones, UA: NEGATIVE
Nitrite, UA: NEGATIVE
Urobilinogen, UA: 0.2

## 2011-02-09 LAB — HEPATIC FUNCTION PANEL
Alkaline Phosphatase: 60 U/L (ref 39–117)
Bilirubin, Direct: 0.1 mg/dL (ref 0.0–0.3)
Total Bilirubin: 0.5 mg/dL (ref 0.3–1.2)
Total Protein: 6.6 g/dL (ref 6.0–8.3)

## 2011-02-09 LAB — CBC WITH DIFFERENTIAL/PLATELET
Eosinophils Absolute: 0.4 10*3/uL (ref 0.0–0.7)
Lymphs Abs: 2.3 10*3/uL (ref 0.7–4.0)
Neutro Abs: 4 10*3/uL (ref 1.4–7.7)
RDW: 15.3 % — ABNORMAL HIGH (ref 11.5–14.6)

## 2011-02-09 LAB — BASIC METABOLIC PANEL
GFR: 98.77 mL/min (ref >60.00)
Potassium: 4.1 mEq/L (ref 3.5–5.1)

## 2011-02-09 MED ORDER — OMEPRAZOLE 40 MG PO CPDR: 40.0000 mg | DELAYED_RELEASE_CAPSULE | Freq: Every day | ORAL | Status: DC

## 2011-02-09 MED ORDER — FLUOXETINE HCL 20 MG PO CAPS: 20.0000 mg | ORAL_CAPSULE | Freq: Every day | ORAL | Status: DC

## 2011-02-09 MED ORDER — POTASSIUM CHLORIDE ER 10 MEQ PO CPCR: 10.0000 meq | ORAL_CAPSULE | Freq: Two times a day (BID) | ORAL | Status: DC

## 2011-02-09 MED ORDER — LOVASTATIN 40 MG PO TABS: 40.0000 mg | ORAL_TABLET | Freq: Every day | ORAL | Status: DC

## 2011-02-09 MED ORDER — HYDROCHLOROTHIAZIDE 25 MG PO TABS: 25.0000 mg | ORAL_TABLET | ORAL | Status: DC

## 2011-02-09 MED ORDER — LISINOPRIL 40 MG PO TABS: 40.0000 mg | ORAL_TABLET | Freq: Every day | ORAL | Status: DC

## 2011-02-09 MED ORDER — LORAZEPAM 0.5 MG PO TABS: 0.5000 mg | ORAL_TABLET | Freq: Every day | ORAL | Status: DC | PRN

## 2011-02-09 NOTE — Patient Instructions (Signed)
Follow-up in one year, sooner if any problems.  Continue your current medications

## 2011-02-09 NOTE — Progress Notes (Signed)
  Subjective:    Patient ID: Marie Harrison, female    DOB: July 15, 1941, 70 y.o.   MRN: [id number]  HPIcinstance is a 70 year old, married female, nonsmoker, who comes in today for Medicare wellness examination because of a history of underlying allergic rhinitis, depression, hypertension, hyperlipidemia, reflux, esophagitis,  Her problem list and her medications were reviewed in detail and there have been no significant changes except that she takes the Prilosec p.r.n. And the Ativan p.r.n.  She is not able to walk on a daily basis that she has chronic foot pain.  She's been to see the orthopedist, and she now has orthotics.  If this does not work then the next step would be to have an operation.  She also would like her skin checked thoroughly.  She has a knot on her scalp and she has a lot of gas.  Question lactose induced.  She gets routine eye care, dental care, BSE monthly, and a mammography, colonoscopy, normal, tetanus, 2011, Pneumovax 2006, shingles 2009  She's unable to walk on daily basis because of chronic foot pain.  Home safety was reviewed.  No issues.  There are guns in the house.  However, their locked.  She does have a healthcare power of attorney, and a living will.  Her cognitive function is normal.    Review of Systems  Constitutional: Negative.   HENT: Negative.   Eyes: Negative.   Respiratory: Negative.   Cardiovascular: Negative.   Gastrointestinal: Negative.   Genitourinary: Negative.   Musculoskeletal: Negative.   Neurological: Negative.   Hematological: Negative.   Psychiatric/Behavioral: Negative.        Objective:   Physical Exam  Constitutional: She appears well-developed and well-nourished.  HENT:  Head: Normocephalic and atraumatic.  Right Ear: External ear normal.  Left Ear: External ear normal.  Nose: Nose normal.  Mouth/Throat: Oropharynx is clear and moist.  Eyes: EOM are normal. Pupils are equal, round, and reactive to light.  Neck: Normal  range of motion. Neck supple. No thyromegaly present.  Cardiovascular: Normal rate, regular rhythm, normal heart sounds and intact distal pulses.  Exam reveals no gallop and no friction rub.   No murmur heard. Pulmonary/Chest: Effort normal and breath sounds normal.  Abdominal: Soft. Bowel sounds are normal. She exhibits no distension and no mass. There is no tenderness. There is no rebound.  Genitourinary: Vagina normal and uterus normal. Guaiac negative stool. No vaginal discharge found.       Bilateral breast exam normal  Musculoskeletal: Normal range of motion.  Lymphadenopathy:    She has no cervical adenopathy.  Neurological: She is alert. She has normal reflexes. No cranial nerve deficit. She exhibits normal muscle tone. Coordination normal.  Skin: Skin is warm and dry.  Psychiatric: She has a normal mood and affect. Her behavior is normal. Judgment and thought content normal.          Assessment & Plan:  Hypertension continue medication.  Hyperlipidemia.  Continue medications.  Depression.  Continue medication.  Reflux esophagitis.  Continue medication.  Allergic rhinitis take plain Claritin.  No D.  Bilateral foot pain.  Follow-up with orthopedics

## 2011-02-11 NOTE — Progress Notes (Signed)
Left message on machine for patient

## 2011-02-20 ENCOUNTER — Other Ambulatory Visit: Payer: Self-pay

## 2011-02-20 DIAGNOSIS — I10 Essential (primary) hypertension: Secondary | ICD-10-CM

## 2011-02-20 DIAGNOSIS — F329 Major depressive disorder, single episode, unspecified: Secondary | ICD-10-CM

## 2011-02-20 DIAGNOSIS — K219 Gastro-esophageal reflux disease without esophagitis: Secondary | ICD-10-CM

## 2011-02-20 DIAGNOSIS — F3289 Other specified depressive episodes: Secondary | ICD-10-CM

## 2011-02-20 DIAGNOSIS — E785 Hyperlipidemia, unspecified: Secondary | ICD-10-CM

## 2011-02-20 MED ORDER — LISINOPRIL 40 MG PO TABS: 40.0000 mg | ORAL_TABLET | Freq: Every day | ORAL | Status: DC

## 2011-02-20 MED ORDER — FLUOXETINE HCL 20 MG PO CAPS: 20.0000 mg | ORAL_CAPSULE | Freq: Every day | ORAL | Status: DC

## 2011-02-20 MED ORDER — HYDROCHLOROTHIAZIDE 25 MG PO TABS: 25.0000 mg | ORAL_TABLET | ORAL | Status: DC

## 2011-02-20 MED ORDER — POTASSIUM CHLORIDE ER 10 MEQ PO CPCR: 10.0000 meq | ORAL_CAPSULE | Freq: Two times a day (BID) | ORAL | Status: DC

## 2011-02-20 MED ORDER — OMEPRAZOLE 40 MG PO CPDR: 40.0000 mg | DELAYED_RELEASE_CAPSULE | Freq: Every day | ORAL | Status: DC

## 2011-02-20 MED ORDER — LORAZEPAM 0.5 MG PO TABS: 0.5000 mg | ORAL_TABLET | Freq: Every day | ORAL | Status: DC | PRN

## 2011-02-20 MED ORDER — LOVASTATIN 40 MG PO TABS: 40.0000 mg | ORAL_TABLET | Freq: Every day | ORAL | Status: DC

## 2011-02-20 NOTE — Telephone Encounter (Signed)
Pt called and stated that she needs all [facility] of [facility].  [facility] called and all rx have been cancelled there and sent to [facility]. Pt is aware.

## 2011-02-24 ENCOUNTER — Other Ambulatory Visit: Payer: Self-pay | Admitting: Family Medicine

## 2011-02-24 DIAGNOSIS — Z1231 Encounter for screening mammogram for malignant neoplasm of breast: Secondary | ICD-10-CM

## 2011-02-25 ENCOUNTER — Ambulatory Visit (HOSPITAL_COMMUNITY)
Admission: RE | Admit: 2011-02-25 | Discharge: 2011-02-25 | Disposition: A | Discharge status: Home / Self Care | Payer: Medicare Other | Source: Ambulatory Visit | Attending: Family Medicine | Admitting: Family Medicine

## 2011-02-25 DIAGNOSIS — Z1231 Encounter for screening mammogram for malignant neoplasm of breast: Secondary | ICD-10-CM | POA: Insufficient documentation

## 2011-09-04 ENCOUNTER — Telehealth: Payer: Self-pay | Admitting: Family Medicine

## 2011-09-04 DIAGNOSIS — F329 Major depressive disorder, single episode, unspecified: Secondary | ICD-10-CM

## 2011-09-04 DIAGNOSIS — F3289 Other specified depressive episodes: Secondary | ICD-10-CM

## 2011-09-04 MED ORDER — FLUOXETINE HCL 20 MG PO CAPS: 20.0000 mg | ORAL_CAPSULE | Freq: Every day | ORAL | Status: DC

## 2011-09-04 NOTE — Telephone Encounter (Signed)
Pt requesting a 30 day supply on FLUoxetine (PROZAC) 20 MG capsule  Call in to [facility] [address]

## 2011-11-02 ENCOUNTER — Ambulatory Visit (INDEPENDENT_AMBULATORY_CARE_PROVIDER_SITE_OTHER): Payer: Medicare Other | Admitting: Family Medicine

## 2011-11-02 ENCOUNTER — Telehealth: Payer: Self-pay | Admitting: *Deleted

## 2011-11-02 ENCOUNTER — Encounter: Payer: Self-pay | Admitting: Family Medicine

## 2011-11-02 DIAGNOSIS — J069 Acute upper respiratory infection, unspecified: Secondary | ICD-10-CM

## 2011-11-02 DIAGNOSIS — J45909 Unspecified asthma, uncomplicated: Secondary | ICD-10-CM

## 2011-11-02 MED ORDER — PREDNISONE 20 MG PO TABS: ORAL_TABLET | ORAL | Status: DC

## 2011-11-02 MED ORDER — HYDROCODONE-HOMATROPINE 5-1.5 MG/5ML PO SYRP: ORAL_SOLUTION | ORAL | Status: DC

## 2011-11-02 NOTE — Telephone Encounter (Signed)
Pt is calling for appt with Dr. Tawanna Cooler for viral infection.  Appt given.

## 2011-11-02 NOTE — Patient Instructions (Signed)
Drink lots of water  Run a vaporizer in your bedroom at night  Hydromet 1/2-1 teaspoon at bedtime when necessary for cough and cold  Prednisone use as directed return when necessary

## 2011-11-02 NOTE — Progress Notes (Signed)
  Subjective:    Patient ID: Marie Harrison, female    DOB: 1940-12-09, 71 y.o.   MRN: [id number]  HPI Marie Harrison is a 71 year old female who comes in with a five-day history of a cold  She said that condition sore throat and cough. No fever review of systems otherwise negative general and pulmonary review of systems otherwise negative   Review of Systems General and pulmonary review of systems otherwise negative    Objective:   Physical Exam Well-developed well-nourished female in no acute distress HEENT negative neck was supple no adenopathy lungs are clear except for some mild late expiratory wheezing       Assessment & Plan:  Viral syndrome with some mild asthma plan lots of liquids Hydromet short course of low-dose prednisone for the wheezing

## 2011-11-03 ENCOUNTER — Telehealth: Payer: Self-pay | Admitting: Family Medicine

## 2011-11-03 DIAGNOSIS — F3289 Other specified depressive episodes: Secondary | ICD-10-CM

## 2011-11-03 DIAGNOSIS — F329 Major depressive disorder, single episode, unspecified: Secondary | ICD-10-CM

## 2011-11-03 NOTE — Telephone Encounter (Signed)
Pt was in here yesterday and mentioned she needed another Rx for Ativan. She thought that meant you were going to call it in. She isn't sure whether you did. Should have gone to [facility]. Please call in. Thanks.

## 2011-11-03 NOTE — Telephone Encounter (Signed)
Ativan 0.5 dispense 30 tabs directions one by mouth each bedtime when necessary refills x4

## 2011-11-04 MED ORDER — LORAZEPAM 0.5 MG PO TABS: 0.5000 mg | ORAL_TABLET | Freq: Every day | ORAL | Status: DC | PRN

## 2011-11-04 NOTE — Telephone Encounter (Signed)
rx called in

## 2011-11-16 ENCOUNTER — Encounter: Payer: Self-pay | Admitting: Family Medicine

## 2011-11-16 ENCOUNTER — Ambulatory Visit (INDEPENDENT_AMBULATORY_CARE_PROVIDER_SITE_OTHER): Payer: Medicare Other | Admitting: Family Medicine

## 2011-11-16 DIAGNOSIS — J069 Acute upper respiratory infection, unspecified: Secondary | ICD-10-CM

## 2011-11-16 NOTE — Patient Instructions (Signed)
Take plain Claritin in the morning or plain Zyrtec at bedtime  Check a blood pressure in the morning and evening daily return in 4 weeks for followup with the data and the device

## 2011-11-16 NOTE — Progress Notes (Signed)
  Subjective:    Patient ID: Marie Harrison, female    DOB: 01-18-1941, 71 y.o.   MRN: [id number]  HPI Taniah is a 71 year old married female nonsmoker who comes in today for evaluation of 2 problems  She says she has pain in the left side of her face. She points to the left frontal sinus the left maxillary sinus in her left TMJ. She states it comes and goes. It's a dull ache. No illness of any kind no fever etc.  She takes lisinopril 40 mg daily BP 120/78 however she states her blood pressure in the evenings are gone up to 1 4150 systolic.   Review of Systems    general ENT and cardiovascular review of systems otherwise negative Objective:   Physical Exam  Well-developed well-nourished female in no acute distress HEENT negative neck was supple no adenopathy there might be some tenderness in the left TMJ it's very difficult to tell with her  BP 120/70 right arm sitting position    Assessment & Plan:  Allergic rhinitis question question plain Zyrtec or Claritin daily  Hypertension seemingly at goal plan BP check twice a day followup 4 weeks

## 2011-12-01 ENCOUNTER — Telehealth: Payer: Self-pay

## 2011-12-01 ENCOUNTER — Telehealth: Payer: Self-pay | Admitting: Family Medicine

## 2011-12-01 MED ORDER — FLUTICASONE PROPIONATE 50 MCG/ACT NA SUSP: 2.0000 | Freq: Every day | NASAL | Status: DC

## 2011-12-01 NOTE — Telephone Encounter (Signed)
I informed the patient and she states that she is going to urgent care to get an antibiotic

## 2011-12-01 NOTE — Telephone Encounter (Signed)
Patient has yellow thick drainage, body aches, cough, no fever, for about a month.  She is going out of town in 2 weeks.  She would like something called in if possible.  Maybe an antibiotic?

## 2011-12-01 NOTE — Telephone Encounter (Signed)
Patient called stating she has cold and sinus issues. No appts avail. Please assist

## 2011-12-01 NOTE — Telephone Encounter (Signed)
These are symptoms consistent with allergic rhinitis I recommend she take a plain Claritin 10 mg daily in the morning and we can add a steroid nasal spray Flonase one shot each nostril twice daily dispense one unit 6 refills

## 2011-12-01 NOTE — Telephone Encounter (Signed)
Pt states Dr. Tawanna Cooler had offered for pt to have a steroid nasal spray sent to [facility] [address].  Pls send rx or call pt to update.

## 2012-01-26 ENCOUNTER — Other Ambulatory Visit: Payer: Self-pay | Admitting: Family Medicine

## 2012-01-26 DIAGNOSIS — Z1231 Encounter for screening mammogram for malignant neoplasm of breast: Secondary | ICD-10-CM

## 2012-02-11 ENCOUNTER — Ambulatory Visit (INDEPENDENT_AMBULATORY_CARE_PROVIDER_SITE_OTHER): Payer: Medicare Other | Admitting: Family Medicine

## 2012-02-11 ENCOUNTER — Encounter: Payer: Self-pay | Admitting: Family Medicine

## 2012-02-11 VITALS — BP 110/70 | Temp 98.2°F | Ht 62.0 in | Wt 159.0 lb

## 2012-02-11 DIAGNOSIS — M1990 Unspecified osteoarthritis, unspecified site: Secondary | ICD-10-CM

## 2012-02-11 DIAGNOSIS — K219 Gastro-esophageal reflux disease without esophagitis: Secondary | ICD-10-CM

## 2012-02-11 DIAGNOSIS — I10 Essential (primary) hypertension: Secondary | ICD-10-CM

## 2012-02-11 DIAGNOSIS — Z0000 Encounter for general adult medical examination without abnormal findings: Secondary | ICD-10-CM

## 2012-02-11 DIAGNOSIS — F329 Major depressive disorder, single episode, unspecified: Secondary | ICD-10-CM

## 2012-02-11 DIAGNOSIS — R32 Unspecified urinary incontinence: Secondary | ICD-10-CM

## 2012-02-11 DIAGNOSIS — E785 Hyperlipidemia, unspecified: Secondary | ICD-10-CM

## 2012-02-11 DIAGNOSIS — J301 Allergic rhinitis due to pollen: Secondary | ICD-10-CM | POA: Insufficient documentation

## 2012-02-11 DIAGNOSIS — F3289 Other specified depressive episodes: Secondary | ICD-10-CM

## 2012-02-11 LAB — LIPID PANEL
Cholesterol: 138 mg/dL (ref 0–200)
HDL: 60.5 mg/dL (ref >39.00)
LDL Cholesterol: 68 mg/dL (ref 0–99)
Total CHOL/HDL Ratio: 2
Triglycerides: 48 mg/dL (ref 0.0–149.0)
VLDL: 9.6 mg/dL (ref 0.0–40.0)

## 2012-02-11 LAB — POCT URINALYSIS DIPSTICK
Bilirubin, UA: NEGATIVE
Blood, UA: NEGATIVE
Glucose, UA: NEGATIVE
Ketones, UA: NEGATIVE
Leukocytes, UA: NEGATIVE
Nitrite, UA: NEGATIVE
pH, UA: 8.5

## 2012-02-11 LAB — HEPATIC FUNCTION PANEL
Albumin: 4 g/dL (ref 3.5–5.2)
Total Protein: 6.7 g/dL (ref 6.0–8.3)

## 2012-02-11 LAB — CBC WITH DIFFERENTIAL/PLATELET
Basophils Absolute: 0.1 10*3/uL (ref 0.0–0.1)
Eosinophils Relative: 5.5 % — ABNORMAL HIGH (ref 0.0–5.0)
HCT: 38.6 % (ref 36.0–46.0)
Hemoglobin: 12.9 g/dL (ref 12.0–15.0)
Lymphocytes Relative: 28.2 % (ref 12.0–46.0)
Lymphs Abs: 2.1 10*3/uL (ref 0.7–4.0)
Monocytes Relative: 7.9 % (ref 3.0–12.0)
Neutro Abs: 4.2 10*3/uL (ref 1.4–7.7)
RBC: 4.01 Mil/uL (ref 3.87–5.11)
RDW: 15.1 % — ABNORMAL HIGH (ref 11.5–14.6)
WBC: 7.3 10*3/uL (ref 4.5–10.5)

## 2012-02-11 LAB — BASIC METABOLIC PANEL
Chloride: 105 mEq/L (ref 96–112)
GFR: 104.19 mL/min (ref >60.00)
Potassium: 3.8 mEq/L (ref 3.5–5.1)
Sodium: 141 mEq/L (ref 135–145)

## 2012-02-11 MED ORDER — HYDROCHLOROTHIAZIDE 25 MG PO TABS: 25.0000 mg | ORAL_TABLET | ORAL | Status: DC

## 2012-02-11 MED ORDER — OMEPRAZOLE 40 MG PO CPDR: 40.0000 mg | DELAYED_RELEASE_CAPSULE | Freq: Every day | ORAL | Status: DC

## 2012-02-11 MED ORDER — FLUTICASONE PROPIONATE 50 MCG/ACT NA SUSP: 2.0000 | Freq: Every day | NASAL | Status: DC

## 2012-02-11 MED ORDER — LISINOPRIL 40 MG PO TABS: 40.0000 mg | ORAL_TABLET | Freq: Every day | ORAL | Status: DC

## 2012-02-11 MED ORDER — LORAZEPAM 0.5 MG PO TABS: 0.5000 mg | ORAL_TABLET | Freq: Every day | ORAL | Status: DC | PRN

## 2012-02-11 MED ORDER — FLUOXETINE HCL 20 MG PO CAPS: 20.0000 mg | ORAL_CAPSULE | Freq: Every day | ORAL | Status: DC

## 2012-02-11 MED ORDER — POTASSIUM CHLORIDE ER 10 MEQ PO CPCR: 10.0000 meq | ORAL_CAPSULE | Freq: Two times a day (BID) | ORAL | Status: DC

## 2012-02-11 MED ORDER — LOVASTATIN 40 MG PO TABS: 40.0000 mg | ORAL_TABLET | Freq: Every day | ORAL | Status: DC

## 2012-02-11 NOTE — Patient Instructions (Signed)
Continue your current medications  Followup in 1 year sooner if any problems 

## 2012-02-11 NOTE — Progress Notes (Signed)
  Subjective:    Patient ID: Marie Harrison, female    DOB: [birth year], 71 y.o.   MRN: [id number]  HPI Marie Harrison is a 71 year old married female nonsmoker who comes in today for a Medicare wellness examination  Her medication list was reviewed in detail and there have been no changes. She does take plain Claritin and steroid nasal spray for allergic rhinitis  Prozac 20 mg each bedtime for mild depression  Hydrochlorothiazide and 40 mg of lisinopril daily for hypertension BP 110/70  Ativan 0.5 each bedtime for anxiety  Mevacor 40 mg daily for hyperlipidemia  OTC anti-inflammatories for knee and foot pain she's going to see the foot doctor because she can't walk more than 10 minutes at a time because of pain in her left foot.  Prilosec 40 mg daily when necessary for reflux and 20 mEq of potassium daily  She gets routine eye care, dental care, and you mammography, recent colonoscopy normal BSE occasionally  Tetanus 2011, Pneumovax 2006, shingles 2009  Cognitive function normal she can't walk because of foot pain home health safety reviewed no issues identified, no guns in the house, she does have a health care power of attorney and living well   Review of Systems  Constitutional: Negative.   HENT: Negative.   Eyes: Negative.   Respiratory: Negative.   Cardiovascular: Negative.   Gastrointestinal: Negative.   Genitourinary: Negative.   Musculoskeletal: Negative.   Neurological: Negative.   Hematological: Negative.   Psychiatric/Behavioral: Negative.        Objective:   Physical Exam  Constitutional: She appears well-developed and well-nourished.  HENT:  Head: Normocephalic and atraumatic.  Right Ear: External ear normal.  Left Ear: External ear normal.  Nose: Nose normal.  Mouth/Throat: Oropharynx is clear and moist.  Eyes: EOM are normal. Pupils are equal, round, and reactive to light.  Neck: Normal range of motion. Neck supple. No thyromegaly present.    Cardiovascular: Normal rate, regular rhythm, normal heart sounds and intact distal pulses.  Exam reveals no gallop and no friction rub.   No murmur heard. Pulmonary/Chest: Effort normal and breath sounds normal.  Abdominal: Soft. Bowel sounds are normal. She exhibits no distension and no mass. There is no tenderness. There is no rebound.  Genitourinary:       Bilateral breast exam normal  Musculoskeletal: Normal range of motion.  Lymphadenopathy:    She has no cervical adenopathy.  Neurological: She is alert. She has normal reflexes. No cranial nerve deficit. She exhibits normal muscle tone. Coordination normal.  Skin: Skin is warm and dry.       Total body skin exam normal except for a 15 mm x 15 mm seborrheic keratosis on her left breast  Psychiatric: She has a normal mood and affect. Her behavior is normal. Judgment and thought content normal.          Assessment & Plan:  Healthy female  Allergic rhinitis continue Claritin and steroid nasal spray  Depression continue Prozac 20 mg daily  Hypertension continue lisinopril 40 mg daily and hydrochlorothiazide 25 mg daily  Anxiety continue Ativan 0.5 each bedtime when necessary  Hyperlipidemia continue Mevacor 40 mg daily  Osteoarthritis orthopedic consult ASAP  Reflux esophagitis continue Prilosec 20 mg daily  History of low potassium secondary to diuretics continue potassium 20 mEq daily

## 2012-02-23 ENCOUNTER — Other Ambulatory Visit: Payer: Self-pay | Admitting: *Deleted

## 2012-02-23 DIAGNOSIS — K219 Gastro-esophageal reflux disease without esophagitis: Secondary | ICD-10-CM

## 2012-02-23 DIAGNOSIS — F3289 Other specified depressive episodes: Secondary | ICD-10-CM

## 2012-02-23 DIAGNOSIS — E785 Hyperlipidemia, unspecified: Secondary | ICD-10-CM

## 2012-02-23 DIAGNOSIS — F329 Major depressive disorder, single episode, unspecified: Secondary | ICD-10-CM

## 2012-02-23 DIAGNOSIS — I10 Essential (primary) hypertension: Secondary | ICD-10-CM

## 2012-02-23 MED ORDER — HYDROCHLOROTHIAZIDE 25 MG PO TABS: 25.0000 mg | ORAL_TABLET | ORAL | Status: DC

## 2012-02-23 MED ORDER — LISINOPRIL 40 MG PO TABS: 40.0000 mg | ORAL_TABLET | Freq: Every day | ORAL | Status: DC

## 2012-02-23 MED ORDER — POTASSIUM CHLORIDE ER 10 MEQ PO CPCR: 10.0000 meq | ORAL_CAPSULE | Freq: Two times a day (BID) | ORAL | Status: DC

## 2012-02-23 MED ORDER — LOVASTATIN 40 MG PO TABS: 40.0000 mg | ORAL_TABLET | Freq: Every day | ORAL | Status: DC

## 2012-02-23 MED ORDER — OMEPRAZOLE 40 MG PO CPDR: 40.0000 mg | DELAYED_RELEASE_CAPSULE | Freq: Every day | ORAL | Status: DC

## 2012-02-23 MED ORDER — FLUTICASONE PROPIONATE 50 MCG/ACT NA SUSP: 2.0000 | Freq: Every day | NASAL | Status: DC

## 2012-02-23 MED ORDER — FLUOXETINE HCL 20 MG PO CAPS: 20.0000 mg | ORAL_CAPSULE | Freq: Every day | ORAL | Status: DC

## 2012-02-25 ENCOUNTER — Other Ambulatory Visit: Payer: Self-pay | Admitting: Family Medicine

## 2012-02-26 ENCOUNTER — Ambulatory Visit (HOSPITAL_COMMUNITY)
Admission: RE | Admit: 2012-02-26 | Discharge: 2012-02-26 | Disposition: A | Discharge status: Home / Self Care | Payer: Medicare Other | Source: Ambulatory Visit | Attending: Family Medicine | Admitting: Family Medicine

## 2012-02-26 DIAGNOSIS — Z1231 Encounter for screening mammogram for malignant neoplasm of breast: Secondary | ICD-10-CM | POA: Insufficient documentation

## 2012-11-01 ENCOUNTER — Ambulatory Visit (INDEPENDENT_AMBULATORY_CARE_PROVIDER_SITE_OTHER): Payer: Medicare Other | Admitting: Family Medicine

## 2012-11-01 ENCOUNTER — Encounter: Payer: Self-pay | Admitting: Family Medicine

## 2012-11-01 VITALS — BP 120/84 | Temp 98.7°F | Wt 160.0 lb

## 2012-11-01 DIAGNOSIS — I10 Essential (primary) hypertension: Secondary | ICD-10-CM

## 2012-11-01 DIAGNOSIS — M1990 Unspecified osteoarthritis, unspecified site: Secondary | ICD-10-CM

## 2012-11-01 LAB — BASIC METABOLIC PANEL
CO2: 32 mEq/L (ref 19–32)
Calcium: 9.4 mg/dL (ref 8.4–10.5)
Creatinine, Ser: 0.6 mg/dL (ref 0.4–1.2)
GFR: 98.29 mL/min (ref >60.00)
Glucose, Bld: 84 mg/dL (ref 70–99)
Sodium: 135 mEq/L (ref 135–145)

## 2012-11-01 LAB — CBC WITH DIFFERENTIAL/PLATELET
Basophils Absolute: 0 10*3/uL (ref 0.0–0.1)
HCT: 39 % (ref 36.0–46.0)
Lymphs Abs: 2.7 10*3/uL (ref 0.7–4.0)
MCHC: 33.1 g/dL (ref 30.0–36.0)
MCV: 94 fl (ref 78.0–100.0)
Monocytes Absolute: 0.6 10*3/uL (ref 0.1–1.0)
Platelets: 299 10*3/uL (ref 150.0–400.0)
RDW: 14.9 % — ABNORMAL HIGH (ref 11.5–14.6)

## 2012-11-01 NOTE — Progress Notes (Signed)
  Subjective:    Patient ID: Euna Armon, female    DOB: [birth year], 72 y.o.   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 She had a shot in her right knee on January of this year however didn't seem to do much good. She's scheduled for an MRI and a followup office visit. She's been taking 3 Aleve daily. I explained to her that she cannot take that much NSAIDs since she's taken an ACE inhibitor it could cause renal failure.   Review of Systems    review of systems negative Objective:   Physical Exam Well-developed well-nourished female no acute distress lower extremities appear normal both knees are swollen not red or hot skin normal       Assessment & Plan:  Degenerative joint disease plan check renal function limit NSAIDs to Motrin 400 mg twice a day stopped the Anaprox stop the Aleve

## 2012-11-01 NOTE — Patient Instructions (Signed)
For your pain you could safely take 2 Tylenol 3 times daily or 400 mg of Motrin twice daily with food and no more  Do not take Aleve or anaprox   Renal function today will call you about your lab work tomorrow

## 2012-11-02 ENCOUNTER — Encounter: Payer: Self-pay | Admitting: Family Medicine

## 2012-11-15 ENCOUNTER — Other Ambulatory Visit: Payer: Self-pay | Admitting: Orthopedic Surgery

## 2012-11-15 MED ORDER — DEXAMETHASONE SODIUM PHOSPHATE 10 MG/ML IJ SOLN: 10.0000 mg | Freq: Once | INTRAMUSCULAR | Status: DC

## 2012-11-15 NOTE — Progress Notes (Signed)
Preoperative surgical orders have been place into the Epic hospital system for [facility] on 11/15/2012, 11:22 AM  by Patrica Duel for surgery on 12/07/2012.  Preop Knee Scope orders including IV Tylenol and IV Decadron as long as there are no contraindications to the above medications. Avel Peace, PA-C

## 2012-11-25 ENCOUNTER — Encounter (HOSPITAL_COMMUNITY): Payer: Self-pay | Admitting: Pharmacy Technician

## 2012-11-30 ENCOUNTER — Encounter (HOSPITAL_COMMUNITY): Payer: Self-pay

## 2012-11-30 ENCOUNTER — Ambulatory Visit (HOSPITAL_COMMUNITY)
Admission: RE | Admit: 2012-11-30 | Discharge: 2012-11-30 | Disposition: A | Discharge status: Home / Self Care | Payer: Medicare Other | Source: Ambulatory Visit | Attending: Orthopedic Surgery | Admitting: Orthopedic Surgery

## 2012-11-30 ENCOUNTER — Encounter (HOSPITAL_COMMUNITY)
Admission: RE | Admit: 2012-11-30 | Discharge: 2012-11-30 | Disposition: A | Discharge status: Home / Self Care | Payer: Medicare Other | Source: Ambulatory Visit | Attending: Orthopedic Surgery | Admitting: Orthopedic Surgery

## 2012-11-30 DIAGNOSIS — I10 Essential (primary) hypertension: Secondary | ICD-10-CM | POA: Insufficient documentation

## 2012-11-30 DIAGNOSIS — Z01812 Encounter for preprocedural laboratory examination: Secondary | ICD-10-CM | POA: Insufficient documentation

## 2012-11-30 DIAGNOSIS — Z01818 Encounter for other preprocedural examination: Secondary | ICD-10-CM | POA: Insufficient documentation

## 2012-11-30 DIAGNOSIS — Z87891 Personal history of nicotine dependence: Secondary | ICD-10-CM | POA: Insufficient documentation

## 2012-11-30 HISTORY — DX: Other specified postprocedural states: R11.2

## 2012-11-30 HISTORY — DX: Other specified postprocedural states: Z98.890

## 2012-11-30 LAB — CBC
HCT: 38.2 % (ref 36.0–46.0)
MCH: 31 pg (ref 26.0–34.0)
MCV: 92.5 fL (ref 78.0–100.0)
Platelets: 315 10*3/uL (ref 150–400)
RBC: 4.13 MIL/uL (ref 3.87–5.11)
WBC: 8.9 10*3/uL (ref 4.0–10.5)

## 2012-11-30 LAB — BASIC METABOLIC PANEL
BUN: 12 mg/dL (ref 6–23)
CO2: 31 mEq/L (ref 19–32)
Calcium: 9.4 mg/dL (ref 8.4–10.5)
Chloride: 96 mEq/L (ref 96–112)
Creatinine, Ser: 0.59 mg/dL (ref 0.50–1.10)
Glucose, Bld: 101 mg/dL — ABNORMAL HIGH (ref 70–99)

## 2012-11-30 NOTE — Pre-Procedure Instructions (Signed)
EKG REPORT IN EPIC FROM 02/11/12. CXR WAS DONE TODAY - PREOP - AT [facility].

## 2012-11-30 NOTE — Patient Instructions (Signed)
YOUR SURGERY IS SCHEDULED AT [facility]  ON:  WED  4/2  REPORT TO [facility] AT:  7:30 AM      PHONE # FOR SHORT STAY IS [phone]  DO NOT EAT OR DRINK ANYTHING AFTER MIDNIGHT THE NIGHT BEFORE YOUR SURGERY.  YOU MAY BRUSH YOUR TEETH, RINSE OUT YOUR MOUTH--BUT NO WATER, NO FOOD, NO CHEWING GUM, NO MINTS, NO CANDIES, NO CHEWING TOBACCO.  PLEASE TAKE THE FOLLOWING MEDICATIONS THE AM OF YOUR SURGERY WITH A FEW SIPS OF WATER:  ALPRAZOLAM IF NEEDED FOR ANXIETY , TAKE FLUOXETINE  IF YOU USE INHALERS--USE YOUR INHALERS THE AM OF YOUR SURGERY AND BRING INHALERS TO THE HOSPITAL.    IF YOU ARE DIABETIC:  DO NOT TAKE ANY DIABETIC MEDICATIONS THE AM OF YOUR SURGERY.  IF YOU TAKE INSULIN IN THE EVENINGS--PLEASE ONLY TAKE 1/2 NORMAL EVENING DOSE THE NIGHT BEFORE YOUR SURGERY.  NO INSULIN THE AM OF YOUR SURGERY.  IF YOU HAVE SLEEP APNEA AND USE CPAP OR BIPAP--PLEASE BRING THE MASK AND THE TUBING.  DO NOT BRING YOUR MACHINE.  DO NOT BRING VALUABLES, MONEY, CREDIT CARDS.  DO NOT WEAR JEWELRY, MAKE-UP, NAIL POLISH AND NO METAL PINS OR CLIPS IN YOUR HAIR. CONTACT LENS, DENTURES / PARTIALS, GLASSES SHOULD NOT BE WORN TO SURGERY AND IN MOST CASES-HEARING AIDS WILL NEED TO BE REMOVED.  BRING YOUR GLASSES CASE, ANY EQUIPMENT NEEDED FOR YOUR CONTACT LENS. FOR PATIENTS ADMITTED TO THE HOSPITAL--CHECK OUT TIME THE DAY OF DISCHARGE IS 11:00 AM.  ALL INPATIENT ROOMS ARE PRIVATE - WITH BATHROOM, TELEPHONE, TELEVISION AND WIFI INTERNET.  IF YOU ARE BEING DISCHARGED THE SAME DAY OF YOUR SURGERY--YOU CAN NOT DRIVE YOURSELF HOME--AND SHOULD NOT GO HOME ALONE BY TAXI OR BUS.  NO DRIVING OR OPERATING MACHINERY FOR 24 HOURS FOLLOWING ANESTHESIA / PAIN MEDICATIONS.  PLEASE MAKE ARRANGEMENTS FOR SOMEONE TO BE WITH YOU AT HOME THE FIRST 24 HOURS AFTER SURGERY. RESPONSIBLE DRIVER'S NAME  CLYDE Kintz - PT'S HUSBAND                                               PHONE #   [phone]            PLEASE READ OVER ANY  FACT SHEETS THAT YOU WERE GIVEN: MRSA INFORMATION, BLOOD TRANSFUSION INFORMATION, INCENTIVE SPIROMETER INFORMATION. FAILURE TO FOLLOW THESE INSTRUCTIONS MAY RESULT IN THE CANCELLATION OF YOUR SURGERY.   PATIENT SIGNATURE_________________________________

## 2012-12-06 DIAGNOSIS — S83249A Other tear of medial meniscus, current injury, unspecified knee, initial encounter: Secondary | ICD-10-CM

## 2012-12-06 NOTE — H&P (Signed)
CC- Marie Harrison is a 72 y.o. female who presents with right knee pain.  HPI- . Knee Pain: Patient presents with knee pain involving the  right knee. Onset of the symptoms was several months ago. Inciting event: none known. Current symptoms include giving out, pain located medially, stiffness and swelling. Pain is aggravated by kneeling, pivoting, rising after sitting and squatting.  Patient has had no prior knee problems. Evaluation to date: MRI: abnormal medial meniscal tear. Treatment to date: corticosteroid injection which was not very effective.  Past Medical History  Diagnosis Date  . Depression   . GERD (gastroesophageal reflux disease)   . Hyperlipidemia   . Hypertension   . Urinary incontinence   . Arthritis   . Chicken pox   . Fainting spell     SEVERAL YRS AGO--PT STATES HER POTASSIUM WAS LOW AT THE TIME--NO PROBLEMS SINCE AND TAKES DAILY POTASSIUM  . Ulcers of yaws   . Hx: UTI (urinary tract infection)     NO PROBLEMS IN LAST 2 YRS  . Arrhythmia     EPISODE OF IRREG HB-WENT TO ER--AND RELEASED - STATES NO PROBLEMS FOUND  . Cerebrovascular accident     EVIDENCE OF STROKE PER CT OF HEAD DONE AS FOLLOW UP OF IRREGULAR HB--PT DOES NOT KNOW OF ANY SPECIFIC INCIDENT OF HAVING STROKE  . Pain     RIGHT KNEE--MENISCAL TEAR; STATES SOME DISCOMFORT IN LEFT KNEE  . PONV (postoperative nausea and vomiting)   . Left foot pain     FALLEN INSTEP-WEARS ORTHOTIC IN HER SHOE AND HAS DISCOMFORT IF SHE DOES A LOT OF WALKING    Past Surgical History  Procedure Laterality Date  . Cataract extraction    . Right foot surgery    . Dilation and curettage of uterus    . Tonsillectomy      Prior to Admission medications   Medication Sig Start Date End Date Taking? Authorizing Provider  ALPRAZolam Prudy Feeler) 0.5 MG tablet Take 0.5 mg by mouth daily as needed for anxiety.    Historical Provider, MD  aspirin EC 81 MG tablet Take 81 mg by mouth daily.    Historical Provider, MD  FLUoxetine  (PROZAC) 20 MG capsule Take 20 mg by mouth every morning.    Historical Provider, MD  fluticasone (FLONASE) 50 MCG/ACT nasal spray Place 2 sprays into the nose daily as needed for allergies (or cold).    Historical Provider, MD  hydrochlorothiazide (HYDRODIURIL) 25 MG tablet Take 25 mg by mouth every morning.    Historical Provider, MD  Hypromellose (SYSTANE OVERNIGHT THERAPY) 0.3 % GEL Place 1 drop into both eyes daily as needed (for dry eyes).    Historical Provider, MD  ibuprofen (ADVIL,MOTRIN) 200 MG tablet Take 200-400 mg by mouth every morning.    Historical Provider, MD  lisinopril (PRINIVIL,ZESTRIL) 40 MG tablet Take 40 mg by mouth every morning.    Historical Provider, MD  loratadine (CLARITIN) 10 MG tablet Take 10 mg by mouth daily.    Historical Provider, MD  lovastatin (MEVACOR) 40 MG tablet Take 40 mg by mouth daily with supper.    Historical Provider, MD  omeprazole (PRILOSEC) 40 MG capsule Take 40 mg by mouth daily. IN EVENING    Historical Provider, MD  potassium chloride (K-DUR,KLOR-CON) 10 MEQ tablet Take 10 mEq by mouth 2 (two) times daily.     Historical Provider, MD   KNEE EXAM antalgic gait, soft tissue tenderness over medial joint line, negative drawer sign, collateral ligaments  intact, normal ipsilateral hip exam, no effusion  Physical Examination: General appearance - alert, well appearing, and in no distress Mental status - alert, oriented to person, place, and time Chest - clear to auscultation, no wheezes, rales or rhonchi, symmetric air entry Heart - normal rate, regular rhythm, normal S1, S2, no murmurs, rubs, clicks or gallops Abdomen - soft, nontender, nondistended, no masses or organomegaly Neurological - alert, oriented, normal speech, no focal findings or movement disorder noted   Asessment/Plan--- Right knee medial meniscal tear- - Plan rightt knee arthroscopy with meniscal debridement. Procedure risks and potential comps discussed with patient who elects to  proceed. Goals are decreased pain and increased function with a high likelihood of achieving both

## 2012-12-07 ENCOUNTER — Ambulatory Visit (HOSPITAL_COMMUNITY): Payer: Medicare Other | Admitting: *Deleted

## 2012-12-07 ENCOUNTER — Encounter (HOSPITAL_COMMUNITY): Payer: Self-pay | Admitting: *Deleted

## 2012-12-07 ENCOUNTER — Encounter (HOSPITAL_COMMUNITY)
Admission: RE | Disposition: A | Discharge status: Home / Self Care | Payer: Self-pay | Source: Ambulatory Visit | Attending: Orthopedic Surgery

## 2012-12-07 ENCOUNTER — Ambulatory Visit (HOSPITAL_COMMUNITY)
Admission: RE | Admit: 2012-12-07 | Discharge: 2012-12-07 | Disposition: A | Discharge status: Home / Self Care | Payer: Medicare Other | Source: Ambulatory Visit | Attending: Orthopedic Surgery | Admitting: Orthopedic Surgery

## 2012-12-07 DIAGNOSIS — J45909 Unspecified asthma, uncomplicated: Secondary | ICD-10-CM | POA: Insufficient documentation

## 2012-12-07 DIAGNOSIS — M2240 Chondromalacia patellae, unspecified knee: Secondary | ICD-10-CM | POA: Insufficient documentation

## 2012-12-07 DIAGNOSIS — Z791 Long term (current) use of non-steroidal anti-inflammatories (NSAID): Secondary | ICD-10-CM | POA: Insufficient documentation

## 2012-12-07 DIAGNOSIS — S83241A Other tear of medial meniscus, current injury, right knee, initial encounter: Secondary | ICD-10-CM

## 2012-12-07 DIAGNOSIS — F3289 Other specified depressive episodes: Secondary | ICD-10-CM | POA: Insufficient documentation

## 2012-12-07 DIAGNOSIS — S83249A Other tear of medial meniscus, current injury, unspecified knee, initial encounter: Secondary | ICD-10-CM

## 2012-12-07 DIAGNOSIS — Z7982 Long term (current) use of aspirin: Secondary | ICD-10-CM | POA: Insufficient documentation

## 2012-12-07 DIAGNOSIS — F329 Major depressive disorder, single episode, unspecified: Secondary | ICD-10-CM | POA: Insufficient documentation

## 2012-12-07 DIAGNOSIS — M129 Arthropathy, unspecified: Secondary | ICD-10-CM | POA: Insufficient documentation

## 2012-12-07 DIAGNOSIS — IMO0002 Reserved for concepts with insufficient information to code with codable children: Secondary | ICD-10-CM | POA: Insufficient documentation

## 2012-12-07 DIAGNOSIS — I10 Essential (primary) hypertension: Secondary | ICD-10-CM | POA: Insufficient documentation

## 2012-12-07 DIAGNOSIS — X58XXXA Exposure to other specified factors, initial encounter: Secondary | ICD-10-CM | POA: Insufficient documentation

## 2012-12-07 DIAGNOSIS — K219 Gastro-esophageal reflux disease without esophagitis: Secondary | ICD-10-CM | POA: Insufficient documentation

## 2012-12-07 DIAGNOSIS — Z8673 Personal history of transient ischemic attack (TIA), and cerebral infarction without residual deficits: Secondary | ICD-10-CM | POA: Insufficient documentation

## 2012-12-07 DIAGNOSIS — E785 Hyperlipidemia, unspecified: Secondary | ICD-10-CM | POA: Insufficient documentation

## 2012-12-07 DIAGNOSIS — Z79899 Other long term (current) drug therapy: Secondary | ICD-10-CM | POA: Insufficient documentation

## 2012-12-07 HISTORY — PX: KNEE ARTHROSCOPY: SHX127

## 2012-12-07 SURGERY — ARTHROSCOPY, KNEE
Anesthesia: General | Site: Knee | Laterality: Right | Wound class: Clean

## 2012-12-07 MED ORDER — PROMETHAZINE HCL 25 MG/ML IJ SOLN: 6.2500 mg | INTRAMUSCULAR | Status: DC | PRN

## 2012-12-07 MED ORDER — PROPOFOL 10 MG/ML IV EMUL
INTRAVENOUS | Status: DC | PRN
  Administered 2012-12-07: 175 mg via INTRAVENOUS

## 2012-12-07 MED ORDER — BUPIVACAINE-EPINEPHRINE 0.25% -1:200000 IJ SOLN
INTRAMUSCULAR | Status: DC | PRN
  Administered 2012-12-07: 30 mL

## 2012-12-07 MED ORDER — CHLORHEXIDINE GLUCONATE 4 % EX LIQD: 60.0000 mL | Freq: Once | CUTANEOUS | Status: DC

## 2012-12-07 MED ORDER — METHOCARBAMOL 500 MG PO TABS: 500.0000 mg | ORAL_TABLET | Freq: Four times a day (QID) | ORAL | Status: DC

## 2012-12-07 MED ORDER — ACETAMINOPHEN 10 MG/ML IV SOLN
INTRAVENOUS | Status: AC
  Filled 2012-12-07: qty 100

## 2012-12-07 MED ORDER — HYDROCODONE-ACETAMINOPHEN 5-325 MG PO TABS: 1.0000 | ORAL_TABLET | Freq: Four times a day (QID) | ORAL | Status: DC | PRN

## 2012-12-07 MED ORDER — HYDROMORPHONE HCL PF 1 MG/ML IJ SOLN
0.2500 mg | INTRAMUSCULAR | Status: DC | PRN
  Administered 2012-12-07 (×2): 0.5 mg via INTRAVENOUS

## 2012-12-07 MED ORDER — EPHEDRINE SULFATE 50 MG/ML IJ SOLN
INTRAMUSCULAR | Status: DC | PRN
  Administered 2012-12-07 (×2): 5 mg via INTRAVENOUS

## 2012-12-07 MED ORDER — FENTANYL CITRATE 0.05 MG/ML IJ SOLN
INTRAMUSCULAR | Status: DC | PRN
  Administered 2012-12-07: 50 ug via INTRAVENOUS

## 2012-12-07 MED ORDER — MIDAZOLAM HCL 5 MG/5ML IJ SOLN
INTRAMUSCULAR | Status: DC | PRN
  Administered 2012-12-07: 1 mg via INTRAVENOUS

## 2012-12-07 MED ORDER — LACTATED RINGERS IV SOLN: INTRAVENOUS | Status: DC

## 2012-12-07 MED ORDER — CEFAZOLIN SODIUM-DEXTROSE 2-3 GM-% IV SOLR
2.0000 g | INTRAVENOUS | Status: AC
  Administered 2012-12-07: 2 g via INTRAVENOUS

## 2012-12-07 MED ORDER — ONDANSETRON HCL 4 MG/2ML IJ SOLN
INTRAMUSCULAR | Status: DC | PRN
  Administered 2012-12-07 (×2): 2 mg via INTRAVENOUS

## 2012-12-07 MED ORDER — BUPIVACAINE-EPINEPHRINE PF 0.25-1:200000 % IJ SOLN
INTRAMUSCULAR | Status: AC
  Filled 2012-12-07: qty 30

## 2012-12-07 MED ORDER — SODIUM CHLORIDE 0.9 % IV SOLN: INTRAVENOUS | Status: DC

## 2012-12-07 MED ORDER — CEFAZOLIN SODIUM-DEXTROSE 2-3 GM-% IV SOLR
INTRAVENOUS | Status: AC
  Filled 2012-12-07: qty 50

## 2012-12-07 MED ORDER — ACETAMINOPHEN 10 MG/ML IV SOLN
1000.0000 mg | Freq: Once | INTRAVENOUS | Status: AC
  Administered 2012-12-07: 1000 mg via INTRAVENOUS

## 2012-12-07 MED ORDER — LIDOCAINE HCL (CARDIAC) 20 MG/ML IV SOLN
INTRAVENOUS | Status: DC | PRN
  Administered 2012-12-07: 75 mg via INTRAVENOUS

## 2012-12-07 MED ORDER — DEXAMETHASONE SODIUM PHOSPHATE 10 MG/ML IJ SOLN
INTRAMUSCULAR | Status: DC | PRN
  Administered 2012-12-07: 10 mg via INTRAVENOUS

## 2012-12-07 MED ORDER — HYDROMORPHONE HCL PF 1 MG/ML IJ SOLN
INTRAMUSCULAR | Status: AC
  Filled 2012-12-07: qty 1

## 2012-12-07 MED ORDER — LACTATED RINGERS IV SOLN
INTRAVENOUS | Status: DC | PRN
  Administered 2012-12-07 (×2): via INTRAVENOUS

## 2012-12-07 MED ORDER — LACTATED RINGERS IR SOLN
Status: DC | PRN
  Administered 2012-12-07: 9000 mL

## 2012-12-07 SURGICAL SUPPLY — 25 items
BLADE 4.2CUDA (BLADE) ×2 IMPLANT
CLOTH BEACON ORANGE TIMEOUT ST (SAFETY) ×2 IMPLANT
CUFF TOURN SGL QUICK 34 (TOURNIQUET CUFF) ×2
CUFF TRNQT CYL 34X4X40X1 (TOURNIQUET CUFF) ×1 IMPLANT
DECANTER SPIKE VIAL GLASS SM (MISCELLANEOUS) ×1 IMPLANT
DRAPE U-SHAPE 47X51 STRL (DRAPES) ×2 IMPLANT
DRSG EMULSION OIL 3X3 NADH (GAUZE/BANDAGES/DRESSINGS) ×2 IMPLANT
DURAPREP 26ML APPLICATOR (WOUND CARE) ×2 IMPLANT
GLOVE BIO SURGEON STRL SZ8 (GLOVE) ×2 IMPLANT
GLOVE BIOGEL PI IND STRL 8 (GLOVE) ×1 IMPLANT
GLOVE BIOGEL PI INDICATOR 8 (GLOVE) ×1
GOWN STRL NON-REIN LRG LVL3 (GOWN DISPOSABLE) ×2 IMPLANT
MANIFOLD NEPTUNE II (INSTRUMENTS) ×4 IMPLANT
PACK ARTHROSCOPY WL (CUSTOM PROCEDURE TRAY) ×2 IMPLANT
PACK ICE MAXI GEL EZY WRAP (MISCELLANEOUS) ×6 IMPLANT
PADDING CAST COTTON 6X4 STRL (CAST SUPPLIES) ×3 IMPLANT
POSITIONER SURGICAL ARM (MISCELLANEOUS) ×2 IMPLANT
SET ARTHROSCOPY TUBING (MISCELLANEOUS) ×2
SET ARTHROSCOPY TUBING LN (MISCELLANEOUS) ×1 IMPLANT
SPONGE GAUZE 4X4 12PLY (GAUZE/BANDAGES/DRESSINGS) ×1 IMPLANT
SUT ETHILON 4 0 PS 2 18 (SUTURE) ×2 IMPLANT
SYR 30ML LL (SYRINGE) ×1 IMPLANT
TOWEL OR 17X26 10 PK STRL BLUE (TOWEL DISPOSABLE) ×2 IMPLANT
WAND 90 DEG TURBOVAC W/CORD (SURGICAL WAND) ×2 IMPLANT
WRAP KNEE MAXI GEL POST OP (GAUZE/BANDAGES/DRESSINGS) ×4 IMPLANT

## 2012-12-07 NOTE — Preoperative (Signed)
Beta Blockers   Reason not to administer Beta Blockers:Not Applicable 

## 2012-12-07 NOTE — Interval H&P Note (Signed)
History and Physical Interval Note:  12/07/2012 9:34 AM  Marie Harrison  has presented today for surgery, with the diagnosis of RIGHT KNEE MEDIAL MENISCUS TEAR   The various methods of treatment have been discussed with the patient and family. After consideration of risks, benefits and other options for treatment, the patient has consented to  Procedure(s): RIGHT KNEE ARTHROSCOPY WITH DEBRIDEMENT  (Right) as a surgical intervention .  The patient's history has been reviewed, patient examined, no change in status, stable for surgery.  I have reviewed the patient's chart and labs.  Questions were answered to the patient's satisfaction.     Loanne Drilling

## 2012-12-07 NOTE — Transfer of Care (Signed)
Immediate Anesthesia Transfer of Care Note  Patient: Marie Harrison  Procedure(s) Performed: Procedure(s): RIGHT KNEE ARTHROSCOPY WITH DEBRIDEMENT AND PARTIAL MEDIAL MENISECTOMY  AND CHONDROPLASTY (Right)  Patient Location: PACU  Anesthesia Type:General  Level of Consciousness: awake, oriented, patient cooperative, lethargic and responds to stimulation  Airway & Oxygen Therapy: Patient Spontanous Breathing and Patient connected to face mask oxygen  Post-op Assessment: Report given to PACU RN, Post -op Vital signs reviewed and stable and Patient moving all extremities  Post vital signs: Reviewed and stable  Complications: No apparent anesthesia complications

## 2012-12-07 NOTE — Anesthesia Preprocedure Evaluation (Signed)
Anesthesia Evaluation  Patient identified by MRN, date of birth, ID band Patient awake    Reviewed: Allergy & Precautions, H&P , NPO status , Patient's Chart, lab work & pertinent test results  History of Anesthesia Complications (+) PONV  Airway Mallampati: II TM Distance: >3 FB Neck ROM: Full    Dental  (+) Partial Upper and Partial Lower   Pulmonary neg pulmonary ROS, asthma ,  breath sounds clear to auscultation  Pulmonary exam normal       Cardiovascular hypertension, negative cardio ROS  Rhythm:Regular Rate:Normal     Neuro/Psych Depression CVA negative neurological ROS  negative psych ROS   GI/Hepatic negative GI ROS, Neg liver ROS, GERD-  ,  Endo/Other  negative endocrine ROS  Renal/GU negative Renal ROS  negative genitourinary   Musculoskeletal negative musculoskeletal ROS (+)   Abdominal   Peds  Hematology negative hematology ROS (+)   Anesthesia Other Findings Large pterygoid roof of mouth;   Reproductive/Obstetrics negative OB ROS                           Anesthesia Physical Anesthesia Plan  ASA: II  Anesthesia Plan: General   Post-op Pain Management:    Induction: Intravenous  Airway Management Planned: LMA  Additional Equipment:   Intra-op Plan:   Post-operative Plan: Extubation in OR  Informed Consent: I have reviewed the patients History and Physical, chart, labs and discussed the procedure including the risks, benefits and alternatives for the proposed anesthesia with the patient or authorized representative who has indicated his/her understanding and acceptance.   Dental advisory given  Plan Discussed with: CRNA  Anesthesia Plan Comments:         Anesthesia Quick Evaluation

## 2012-12-07 NOTE — Anesthesia Postprocedure Evaluation (Signed)
Anesthesia Post Note  Patient: Marie Harrison  Procedure(s) Performed: Procedure(s) (LRB): RIGHT KNEE ARTHROSCOPY WITH DEBRIDEMENT AND PARTIAL MEDIAL MENISECTOMY  AND CHONDROPLASTY (Right)  Anesthesia type: General  Patient location: PACU  Post pain: Pain level controlled  Post assessment: Post-op Vital signs reviewed  Last Vitals:  Filed Vitals:   12/07/12 1115  BP: 148/71  Pulse: 89  Temp: 36.5 C  Resp: 13    Post vital signs: Reviewed  Level of consciousness: sedated  Complications: No apparent anesthesia complications

## 2012-12-07 NOTE — Op Note (Signed)
Preoperative diagnosis-  Right knee medial meniscal tear  Postoperative diagnosis Right- knee medial meniscal tear Plus Right medial chondral defect  Procedure- Right knee arthroscopy with medial Meniscal debridement and chondroplasty   Surgeon- Gus Rankin. Analyce Tavares, MD  Anesthesia-General  EBL-  minimal Complications- None  Condition- PACU - hemodynamically stable.  Brief clinical note- -Marie Harrison is a 72 y.o.  female with a several month history of right knee pain and mechanical symptoms. Exam and history suggested medial meniscal tear confirmed by MRI. The patient presents now for arthroscopy and debridement   Procedure in detail -       After successful administration of General anesthetic, a tourmiquet is placed high on the Right  thigh and the Right lower extremity is prepped and draped in the usual sterile fashion. Time out is performed by the surgical team. Standard superomedial and inferolateral portal sites are marked and incisions made with an 11 blade. The inflow cannula is passed through the superomedial portal and camera through the inferolateral portal and inflow is initiated. Arthroscopic visualization proceeds.      The undersurface of the patella and trochlea are visualized and there is minimal chondromalacia at the inferior pole of the patella. The medial and lateral gutters are visualized and there are  no loose bodies. Flexion and valgus force is applied to the knee and the medial compartment is entered. A spinal needle is passed into the joint through the site marked for the inferomedial portal. A small incision is made and the dilator passed into the joint. The findings for the medial compartment are large 2 x 2 cm area of grade III/IV chnodral defect medial femoral condyle with tear in body and posterior horn of the medial meniscus . The tear is debrided to a stable base with baskets and a shaver and sealed off with the Arthrocare. The shaver is used to debride the  unstable cartilage to a stable bony base with stable edges. It is probed and found to be stable. The exposed bone is abraded with the shaver.    The intercondylar notch is visualized and the ACL appears normal. The lateral compartment is entered and the findings are normal .       The joint is again inspected and there are no other tears, defects or loose bodies identified. The arthroscopic equipment is then removed from the inferior portals which are closed with interrupted 4-0 nylon. 20 ml of .25% Marcaine with epinephrine are injected through the inflow cannula and the cannula is then removed and the portal closed with nylon. The incisions are cleaned and dried and a bulky sterile dressing is applied. The patient is then awakened and transported to recovery in stable condition.   12/07/2012, 10:27 AM

## 2012-12-08 ENCOUNTER — Encounter (HOSPITAL_COMMUNITY): Payer: Self-pay | Admitting: Orthopedic Surgery

## 2013-01-19 ENCOUNTER — Ambulatory Visit (INDEPENDENT_AMBULATORY_CARE_PROVIDER_SITE_OTHER): Payer: Medicare Other | Admitting: Family Medicine

## 2013-01-19 ENCOUNTER — Encounter: Payer: Self-pay | Admitting: Family Medicine

## 2013-01-19 VITALS — BP 110/80 | Temp 98.7°F | Wt 160.0 lb

## 2013-01-19 DIAGNOSIS — K219 Gastro-esophageal reflux disease without esophagitis: Secondary | ICD-10-CM

## 2013-01-19 NOTE — Progress Notes (Signed)
  Subjective:    Patient ID: Marie Harrison, female    DOB: 17-Apr-1941, 72 y.o.   MRN: [id number]  HPI Marie Harrison is a 72 year old married female nonsmoker who comes in today to discuss reflux esophagitis  She's been taking 40 mg of omeprazole daily and seems to have done well until recently when she began having some discomfort and she points to the lower sternal area. She has a sensation that may be food is getting stuck.  She's had no fever chills nausea vomiting or change in bowel habits  Review of systems otherwise negative   Review of Systems    review of systems negative Objective:   Physical Exam Well-developed well nourished female no acute distress       Assessment & Plan:  Symptoms of reflux esophagitis now with breakthrough discomfort and question this fascia question lower esophageal dysfunction,,,,,,,,,,,, GI consult

## 2013-01-19 NOTE — Patient Instructions (Signed)
Until we get you to see the gastroenterologist I would recommend you stay on a soft diet,,,,,,,, no state no hot dogs nothing that's difficult to swallow  Drink lots of water with your meals  Double the Prilosec,,,,,,, take one twice daily

## 2013-01-20 ENCOUNTER — Encounter: Payer: Self-pay | Admitting: Gastroenterology

## 2013-02-02 ENCOUNTER — Other Ambulatory Visit: Payer: Self-pay | Admitting: Family Medicine

## 2013-02-02 DIAGNOSIS — Z1231 Encounter for screening mammogram for malignant neoplasm of breast: Secondary | ICD-10-CM

## 2013-02-17 ENCOUNTER — Ambulatory Visit: Payer: Medicare Other | Admitting: Gastroenterology

## 2013-02-21 ENCOUNTER — Telehealth: Payer: Self-pay | Admitting: Gastroenterology

## 2013-02-21 NOTE — Telephone Encounter (Signed)
Message copied by Arna Snipe on Tue Feb 21, 2013  9:24 AM ------      Message from: Donata Duff      Created: Fri Feb 17, 2013 11:16 AM       DO NOT BILL ------

## 2013-02-28 ENCOUNTER — Ambulatory Visit (HOSPITAL_COMMUNITY)
Admission: RE | Admit: 2013-02-28 | Discharge: 2013-02-28 | Disposition: A | Discharge status: Home / Self Care | Payer: Medicare Other | Source: Ambulatory Visit | Attending: Family Medicine | Admitting: Family Medicine

## 2013-02-28 DIAGNOSIS — Z1231 Encounter for screening mammogram for malignant neoplasm of breast: Secondary | ICD-10-CM | POA: Insufficient documentation

## 2013-04-06 ENCOUNTER — Other Ambulatory Visit: Payer: Self-pay | Admitting: *Deleted

## 2013-04-06 MED ORDER — LOVASTATIN 40 MG PO TABS: 40.0000 mg | ORAL_TABLET | Freq: Every day | ORAL | Status: DC

## 2013-04-06 MED ORDER — OMEPRAZOLE 40 MG PO CPDR: 40.0000 mg | DELAYED_RELEASE_CAPSULE | Freq: Every day | ORAL | Status: DC

## 2013-04-06 MED ORDER — FLUTICASONE PROPIONATE 50 MCG/ACT NA SUSP: 2.0000 | Freq: Every day | NASAL | Status: DC | PRN

## 2013-04-06 MED ORDER — POTASSIUM CHLORIDE CRYS ER 10 MEQ PO TBCR: 10.0000 meq | EXTENDED_RELEASE_TABLET | Freq: Two times a day (BID) | ORAL | Status: DC

## 2013-04-06 MED ORDER — FLUOXETINE HCL 20 MG PO CAPS: 20.0000 mg | ORAL_CAPSULE | Freq: Every morning | ORAL | Status: DC

## 2013-04-07 ENCOUNTER — Other Ambulatory Visit: Payer: Self-pay | Admitting: *Deleted

## 2013-04-07 MED ORDER — LISINOPRIL 40 MG PO TABS: 40.0000 mg | ORAL_TABLET | Freq: Every morning | ORAL | Status: DC

## 2013-04-07 MED ORDER — HYDROCHLOROTHIAZIDE 25 MG PO TABS: 25.0000 mg | ORAL_TABLET | Freq: Every morning | ORAL | Status: DC

## 2013-04-12 ENCOUNTER — Encounter: Payer: Medicare Other | Admitting: Family Medicine

## 2013-04-13 ENCOUNTER — Encounter: Payer: Medicare Other | Admitting: Family

## 2013-04-14 ENCOUNTER — Ambulatory Visit (INDEPENDENT_AMBULATORY_CARE_PROVIDER_SITE_OTHER): Payer: Medicare Other | Admitting: Family Medicine

## 2013-04-14 ENCOUNTER — Encounter: Payer: Self-pay | Admitting: Family Medicine

## 2013-04-14 VITALS — BP 130/82 | Temp 98.6°F | Ht 61.0 in | Wt 158.0 lb

## 2013-04-14 DIAGNOSIS — I10 Essential (primary) hypertension: Secondary | ICD-10-CM

## 2013-04-14 DIAGNOSIS — Z0000 Encounter for general adult medical examination without abnormal findings: Secondary | ICD-10-CM

## 2013-04-14 DIAGNOSIS — F3289 Other specified depressive episodes: Secondary | ICD-10-CM

## 2013-04-14 DIAGNOSIS — E785 Hyperlipidemia, unspecified: Secondary | ICD-10-CM

## 2013-04-14 DIAGNOSIS — F329 Major depressive disorder, single episode, unspecified: Secondary | ICD-10-CM

## 2013-04-14 DIAGNOSIS — K219 Gastro-esophageal reflux disease without esophagitis: Secondary | ICD-10-CM

## 2013-04-14 LAB — LIPID PANEL
Cholesterol: 165 mg/dL (ref 0–200)
HDL: 52.3 mg/dL (ref >39.00)
Triglycerides: 90 mg/dL (ref 0.0–149.0)

## 2013-04-14 NOTE — Patient Instructions (Addendum)
We recommend the following healthy lifestyle measures: - eat a healthy diet consisting of lots of vegetables, fruits, beans, nuts, seeds, healthy meats such as white chicken and fish and whole grains.  - avoid fried foods, fast food, processed foods, sodas, red meet and other fattening foods.  - get a least 150 minutes of aerobic exercise per week.   Vitamin D3 1000 IU daily and calcium 1200mg  total in diet and supplement daily, weight bearing exercise and discuss dexa      Pneumococcal Vaccine: ASSESSMENT/PLAN: up to date:      Screening mammograph (yearly if >40) ASSESSMENT/PLAN:  Done 01/28/13      Screening Pap smear/pelvic exam (q2 years) ASSESSMENT/PLAN:  N/A - aged out, all normal in the past      Colorectal cancer screening (FOBT yearly or flex sig q4y or colonoscopy q10y or barium enema q4y) ASSESSMENT/PLAN: colonoscopy in 2006 normal per chart      Bone mass measurements: ASSESSMENT/PLAN: not sure if done, she will discuss with PCP in meantime will take calcium and vitamin D      Cardiovascular screening blood tests (lipids q5y) ASSESSMENT/PLAN: Done yearly, rechecking lipids      Diabetes screening tests ASSESSMENT/PLAN: Done in march  Follow up in: 4-6 months       Fall Prevention and Home Safety Falls cause injuries and can affect all age groups. It is possible to use preventive measures to significantly decrease the likelihood of falls. There are many simple measures which can make your home safer and prevent falls. OUTDOORS  Repair cracks and edges of walkways and driveways.  Remove high doorway thresholds.  Trim shrubbery on the main path into your home.  Have good outside lighting.  Clear walkways of tools, rocks, debris, and clutter.  Check that handrails are not broken and are securely fastened. Both sides of steps should have handrails.  Have leaves, snow, and ice cleared regularly.  Use sand or salt on walkways during winter months.  In the  garage, clean up grease or oil spills. BATHROOM  Install night lights.  Install grab bars by the toilet and in the tub and shower.  Use non-skid mats or decals in the tub or shower.  Place a plastic non-slip stool in the shower to sit on, if needed.  Keep floors dry and clean up all water on the floor immediately.  Remove soap buildup in the tub or shower on a regular basis.  Secure bath mats with non-slip, double-sided rug tape.  Remove throw rugs and tripping hazards from the floors. BEDROOMS  Install night lights.  Make sure a bedside light is easy to reach.  Do not use oversized bedding.  Keep a telephone by your bedside.  Have a firm chair with side arms to use for getting dressed.  Remove throw rugs and tripping hazards from the floor. KITCHEN  Keep handles on pots and pans turned toward the center of the stove. Use back burners when possible.  Clean up spills quickly and allow time for drying.  Avoid walking on wet floors.  Avoid hot utensils and knives.  Position shelves so they are not too high or low.  Place commonly used objects within easy reach.  If necessary, use a sturdy step stool with a grab bar when reaching.  Keep electrical cables out of the way.  Do not use floor polish or wax that makes floors slippery. If you must use wax, use non-skid floor wax.  Remove throw rugs and tripping  hazards from the floor. STAIRWAYS  Never leave objects on stairs.  Place handrails on both sides of stairways and use them. Fix any loose handrails. Make sure handrails on both sides of the stairways are as long as the stairs.  Check carpeting to make sure it is firmly attached along stairs. Make repairs to worn or loose carpet promptly.  Avoid placing throw rugs at the top or bottom of stairways, or properly secure the rug with carpet tape to prevent slippage. Get rid of throw rugs, if possible.  Have an electrician put in a light switch at the top and  bottom of the stairs. OTHER FALL PREVENTION TIPS  Wear low-heel or rubber-soled shoes that are supportive and fit well. Wear closed toe shoes.  When using a stepladder, make sure it is fully opened and both spreaders are firmly locked. Do not climb a closed stepladder.  Add color or contrast paint or tape to grab bars and handrails in your home. Place contrasting color strips on first and last steps.  Learn and use mobility aids as needed. Install an electrical emergency response system.  Turn on lights to avoid dark areas. Replace light bulbs that burn out immediately. Get light switches that glow.  Arrange furniture to create clear pathways. Keep furniture in the same place.  Firmly attach carpet with non-skid or double-sided tape.  Eliminate uneven floor surfaces.  Select a carpet pattern that does not visually hide the edge of steps.  Be aware of all pets. OTHER HOME SAFETY TIPS  Set the water temperature for 120 F (48.8 C).  Keep emergency numbers on or near the telephone.  Keep smoke detectors on every level of the home and near sleeping areas. Document Released: 08/14/2002 Document Revised: 02/23/2012 Document Reviewed: 11/13/2011 [facility] Patient Information 2014 [place], [place].

## 2013-04-14 NOTE — Progress Notes (Signed)
Quick Note:  Called and spoke with pt and pt is aware. ______ 

## 2013-04-14 NOTE — Progress Notes (Signed)
Medicare Annual Preventive Care Visit  (annual wellness exam)  72 yo pt of Dr. Nelida Meuse here for annual exam.  Concerns today:  1) GERD -referred to GI and PPI doubled by PCP last visit -reports symptoms resolved after changing diet so did not see GI, no dysphagia  -denies: heartburn, reflux, abd pain, dysphagia  2)HTN: -on asa, hctz, lisinopril -stable -denies: CP, SOb, DOE, swelling  3)HLD: -on lovastatin -stable  4)Depression: -on prozac -stable  Labs: had cbc and bmp a few months ago and these were ok. Due for lipid panel.  1.) Patient-completed health risk assessment  - completed and reviewed, see scanned documentation  2.) Review of Medical History: -PMH, PSH, Family History and current specialty and care providers reviewed and updated and scanned into chart and copy given to patient  3.) Review of functional ability and level of safety:  Any difficulty hearing?  NO  History of falling? YES - mechanical  Any trouble with IADLs - using a phone, using transportation, grocery shopping, preparing meals, doing housework, doing laundry, taking medications and managing money? NO  Advance Directives? YES  See summary of recommendations in Patient Instructions below.  4.) Physical Exam Filed Vitals:   04/14/13 0937  BP: 130/82  Temp: 98.6 F (37 C)   Estimated body mass index is 29.87 kg/(m^2) as calculated from the following:   Height as of this encounter: 5\' 1"  (1.549 m).   Weight as of this encounter: 158 lb (71.668 kg).  Mini Cog: 1. Patient instructed to listen carefully and repeat the following: Apple Watch    Penny  2. Clock drawing test was administered: NORMAL     ABNORMAL  3. Recall of three words  Scoring:  Number of words recalled? 3 Clock Draw: normal Normal = negative screen for cognitive impairment Abnormal = positive screen for cognitive impairment Patient Score: NEG    See patient instructions for recommendations.  4)The  following written screening schedule of preventive measures were reviewed with assessment and plan made per below, orders and patient instructions:    7.) Summary: -risk factors and conditions per above assessment were discussed and treatment, recommendations and referrals were offered per documentation above and orders and patient instructions.

## 2013-04-21 ENCOUNTER — Other Ambulatory Visit: Payer: Self-pay | Admitting: *Deleted

## 2013-04-21 MED ORDER — POTASSIUM CHLORIDE CRYS ER 10 MEQ PO TBCR: 10.0000 meq | EXTENDED_RELEASE_TABLET | Freq: Two times a day (BID) | ORAL | Status: DC

## 2013-10-17 ENCOUNTER — Telehealth: Payer: Self-pay | Admitting: Family Medicine

## 2013-10-17 MED ORDER — FLUOXETINE HCL 20 MG PO CAPS: 20.0000 mg | ORAL_CAPSULE | Freq: Every morning | ORAL | Status: DC

## 2013-10-17 MED ORDER — OMEPRAZOLE 40 MG PO CPDR: 40.0000 mg | DELAYED_RELEASE_CAPSULE | Freq: Every day | ORAL | Status: DC

## 2013-10-17 MED ORDER — POTASSIUM CHLORIDE CRYS ER 10 MEQ PO TBCR: 10.0000 meq | EXTENDED_RELEASE_TABLET | Freq: Two times a day (BID) | ORAL | Status: DC

## 2013-10-17 MED ORDER — LOVASTATIN 40 MG PO TABS: 40.0000 mg | ORAL_TABLET | Freq: Every day | ORAL | Status: DC

## 2013-10-17 MED ORDER — HYDROCHLOROTHIAZIDE 25 MG PO TABS: 25.0000 mg | ORAL_TABLET | Freq: Every morning | ORAL | Status: DC

## 2013-10-17 MED ORDER — LISINOPRIL 40 MG PO TABS: 40.0000 mg | ORAL_TABLET | Freq: Every morning | ORAL | Status: DC

## 2013-10-17 NOTE — Telephone Encounter (Signed)
Pt is calling concerning rx FLUoxetine (PROZAC) 20 MG capsule pt is requesting to speak with rachel directly.

## 2013-10-17 NOTE — Telephone Encounter (Signed)
Spoke with patient and refills sent 

## 2014-01-08 ENCOUNTER — Ambulatory Visit (INDEPENDENT_AMBULATORY_CARE_PROVIDER_SITE_OTHER): Payer: Medicare Other | Admitting: Family Medicine

## 2014-01-08 ENCOUNTER — Encounter: Payer: Self-pay | Admitting: Family Medicine

## 2014-01-08 VITALS — BP 150/90 | Temp 98.3°F | Wt 160.0 lb

## 2014-01-08 DIAGNOSIS — M1990 Unspecified osteoarthritis, unspecified site: Secondary | ICD-10-CM

## 2014-01-08 DIAGNOSIS — I10 Essential (primary) hypertension: Secondary | ICD-10-CM

## 2014-01-08 MED ORDER — LISINOPRIL 40 MG PO TABS: ORAL_TABLET | ORAL | Status: DC

## 2014-01-08 NOTE — Progress Notes (Signed)
Pre visit review using our clinic review tool, if applicable. No additional management support is needed unless otherwise documented below in the visit note. 

## 2014-01-08 NOTE — Progress Notes (Signed)
   Subjective:    Patient ID: Marie Harrison, female    DOB: [birth year], 73 y.o.   MRN: [id number]  HPI Marie Harrison is a 73 year old married female nonsmoker who comes in today for multiple issues  She is a history of underlying hypertension and her blood pressure was well controlled until she went to [place] recently. She was having trouble with her back and hip insulin orthopedist who gave her vimovo          this caused her blood pressure go up to 205/98. She only took a couple doses and stopped it.  She has chronic low back and hip pain she's due to have a total knee replacement in August of this year by Dr. Desmond Dike.  At one juncture in [place] she had a cough went to urgent care and a PA gave her Levaquin without any diagnostic studies. I explained to her that Levaquin is a very potent antibiotic and only should be given for a documented bacterial infection in the future asked the question before take an antibiotic   Review of Systems Negative    Objective:   Physical Exam  Well-developed and nourished female in acute distress vital signs stable she's afebrile BP 150/90 right arm sitting position      Assessment & Plan:  Hypertension not at goal,,,,,,,,,, increase lisinopril to 60 mg daily  Osteoarthritis,,,,,,,,, Motrin 200 mg twice a day,,,,,,,,,,,,,,,, and no more,,,,,,,,,,,, instructed if she needs anything more than that she is to contact Dr. Reynaldo Minium and either take Tylenol or pain medication. I would not take any NSAIDs on top of the Motrin. Explained to this could throw her into renal failure and elevated blood pressure and/or both,,

## 2014-01-08 NOTE — Patient Instructions (Addendum)
Increase the lisinopril to 60 mg daily in the morning  Motrin 200 mg.......... one twice daily.......... if you need anything more for pain and indeed Motrin 200 mg twice daily and contact Dr. Desmond Dike............ you could take Tylenol or a pain medication. NSAIDs skin negatively affect her blood pressure and your kidney function therefore would not take anymore than 200 mg of Motrin twice daily  Check your blood pressure daily in the morning  Blood pressure goal,,,,,,,,, 140/90  If after one month your blood pressure is not at goal,,,,,,,,,,,, return for followup  Set up for your physical examination the first week in August  Labs one week prior

## 2014-01-09 ENCOUNTER — Telehealth: Payer: Self-pay | Admitting: Family Medicine

## 2014-01-09 NOTE — Telephone Encounter (Signed)
Relevant patient education assigned to patient using Emmi. ° °

## 2014-01-23 ENCOUNTER — Encounter: Payer: Self-pay | Admitting: Family Medicine

## 2014-01-23 ENCOUNTER — Ambulatory Visit (INDEPENDENT_AMBULATORY_CARE_PROVIDER_SITE_OTHER): Payer: Medicare Other | Admitting: Family Medicine

## 2014-01-23 VITALS — BP 120/80 | Temp 98.6°F | Wt 160.0 lb

## 2014-01-23 DIAGNOSIS — L301 Dyshidrosis [pompholyx]: Secondary | ICD-10-CM

## 2014-01-23 MED ORDER — TRIAMCINOLONE ACETONIDE 0.1 % EX CREA: TOPICAL_CREAM | CUTANEOUS | Status: DC

## 2014-01-23 NOTE — Progress Notes (Signed)
   Subjective:    Patient ID: Marie Harrison, female    DOB: [birth year], 73 y.o.   MRN: [id number]  HPI Marie Harrison is a 73 year old female who comes in today for a periodic rash on her lower extremities  She has a history of underlying allergic rhinitis. The rash comes and goes   Review of Systems Review of systems negative    Objective:   Physical Exam  Well-developed and nourished female no acute distress vital signs stable she's afebrile examination lower extremities shows an excoriated rash consistent with a eczema-like dermatitis      Assessment & Plan:  Eczema plan treat symptomatic

## 2014-01-23 NOTE — Patient Instructions (Signed)
Triamcinolone gel...... apply small amounts at bedtime until the rash resolves  Return when necessary.

## 2014-01-23 NOTE — Progress Notes (Signed)
Pre visit review using our clinic review tool, if applicable. No additional management support is needed unless otherwise documented below in the visit note. 

## 2014-01-30 ENCOUNTER — Other Ambulatory Visit: Payer: Self-pay | Admitting: Family Medicine

## 2014-01-30 DIAGNOSIS — Z1231 Encounter for screening mammogram for malignant neoplasm of breast: Secondary | ICD-10-CM

## 2014-01-31 ENCOUNTER — Other Ambulatory Visit: Payer: Self-pay | Admitting: Family Medicine

## 2014-01-31 DIAGNOSIS — R1310 Dysphagia, unspecified: Secondary | ICD-10-CM

## 2014-03-05 ENCOUNTER — Ambulatory Visit (HOSPITAL_COMMUNITY)
Admission: RE | Admit: 2014-03-05 | Discharge: 2014-03-05 | Disposition: A | Discharge status: Home / Self Care | Payer: Medicare Other | Source: Ambulatory Visit | Attending: Family Medicine | Admitting: Family Medicine

## 2014-03-05 DIAGNOSIS — Z1231 Encounter for screening mammogram for malignant neoplasm of breast: Secondary | ICD-10-CM | POA: Insufficient documentation

## 2014-03-06 ENCOUNTER — Ambulatory Visit (INDEPENDENT_AMBULATORY_CARE_PROVIDER_SITE_OTHER): Payer: Medicare Other | Admitting: Family Medicine

## 2014-03-06 ENCOUNTER — Encounter: Payer: Self-pay | Admitting: Family Medicine

## 2014-03-06 VITALS — BP 142/80 | HR 74 | Temp 98.1°F | Ht 61.0 in | Wt 162.2 lb

## 2014-03-06 DIAGNOSIS — I10 Essential (primary) hypertension: Secondary | ICD-10-CM

## 2014-03-06 MED ORDER — LISINOPRIL 40 MG PO TABS: ORAL_TABLET | ORAL | Status: DC

## 2014-03-06 NOTE — Progress Notes (Signed)
   Subjective:    Patient ID: Marie Harrison, female    DOB: 28-Aug-1941, 73 y.o.   MRN: [id number]  HPI Mrs. Surges is a 73 year old married female nonsmoker who comes in today for evaluation of hypertension  We increased her lisinopril to 60 mg daily dose of HCTZ is stable 25 mg daily. 6 BP is in the 145 the 517 range systolic diastolic 00-17. Initially when she increased her dose to 60 mg daily 6 her blood pressure dropped to normal. Now it's creeping back up again.  Her blood sugars always been normal. She says her father was diabetic and she was to get a meter to check her blood sugar. Advised that this is not necessary.   Review of Systems Review of systems otherwise negative    Objective:   Physical Exam  Well-developed and nourished female no acute distress no signs stable she's afebrile BP 142/86 right arm sitting position      Assessment & Plan:  Hypertension not at goal,,,,,,,,, increase lisinopril to 80 mg daily followup in 6 weeks

## 2014-03-06 NOTE — Progress Notes (Signed)
left bundle branch block   

## 2014-03-06 NOTE — Progress Notes (Signed)
Pre visit review using our clinic review tool, if applicable. No additional management support is needed unless otherwise documented below in the visit note. 

## 2014-03-06 NOTE — Patient Instructions (Signed)
Lisinopril 40 mg,,,,,,,, 2 tabs in the morning  Hydrochlorothiazide 25 mg.......Marland Kitchen 1 in the morning  BP check daily in the morning  Bring a record of all your blood pressure readings and the device to your physical examination in August

## 2014-04-05 ENCOUNTER — Other Ambulatory Visit: Payer: Self-pay | Admitting: Orthopedic Surgery

## 2014-04-10 ENCOUNTER — Other Ambulatory Visit (INDEPENDENT_AMBULATORY_CARE_PROVIDER_SITE_OTHER): Payer: Medicare Other

## 2014-04-10 DIAGNOSIS — I10 Essential (primary) hypertension: Secondary | ICD-10-CM

## 2014-04-10 DIAGNOSIS — M1990 Unspecified osteoarthritis, unspecified site: Secondary | ICD-10-CM

## 2014-04-10 LAB — LIPID PANEL
CHOLESTEROL: 141 mg/dL (ref 0–200)
HDL: 59.1 mg/dL (ref >39.00)
LDL Cholesterol: 73 mg/dL (ref 0–99)
NONHDL: 81.9
Total CHOL/HDL Ratio: 2
Triglycerides: 47 mg/dL (ref 0.0–149.0)
VLDL: 9.4 mg/dL (ref 0.0–40.0)

## 2014-04-10 LAB — POCT URINALYSIS DIPSTICK
BILIRUBIN UA: NEGATIVE
Blood, UA: NEGATIVE
Glucose, UA: NEGATIVE
Ketones, UA: NEGATIVE
LEUKOCYTES UA: NEGATIVE
Nitrite, UA: NEGATIVE
Protein, UA: NEGATIVE
Spec Grav, UA: 1.01
Urobilinogen, UA: 0.2
pH, UA: 7

## 2014-04-10 LAB — CBC WITH DIFFERENTIAL/PLATELET
Basophils Absolute: 0 10*3/uL (ref 0.0–0.1)
Basophils Relative: 0.4 % (ref 0.0–3.0)
EOS PCT: 3.7 % (ref 0.0–5.0)
Eosinophils Absolute: 0.3 10*3/uL (ref 0.0–0.7)
HCT: 36.7 % (ref 36.0–46.0)
Hemoglobin: 12.3 g/dL (ref 12.0–15.0)
LYMPHS PCT: 21.2 % (ref 12.0–46.0)
Lymphs Abs: 2 10*3/uL (ref 0.7–4.0)
MCHC: 33.5 g/dL (ref 30.0–36.0)
MCV: 96.5 fl (ref 78.0–100.0)
MONOS PCT: 6.9 % (ref 3.0–12.0)
Monocytes Absolute: 0.6 10*3/uL (ref 0.1–1.0)
NEUTROS PCT: 67.8 % (ref 43.0–77.0)
Neutro Abs: 6.2 10*3/uL (ref 1.4–7.7)
PLATELETS: 286 10*3/uL (ref 150.0–400.0)
RBC: 3.81 Mil/uL — ABNORMAL LOW (ref 3.87–5.11)
RDW: 15.6 % — ABNORMAL HIGH (ref 11.5–15.5)
WBC: 9.2 10*3/uL (ref 4.0–10.5)

## 2014-04-10 LAB — HEPATIC FUNCTION PANEL
ALBUMIN: 3.6 g/dL (ref 3.5–5.2)
ALT: 17 U/L (ref 0–35)
AST: 19 U/L (ref 0–37)
Alkaline Phosphatase: 67 U/L (ref 39–117)
Bilirubin, Direct: 0.1 mg/dL (ref 0.0–0.3)
TOTAL PROTEIN: 6.7 g/dL (ref 6.0–8.3)
Total Bilirubin: 0.6 mg/dL (ref 0.2–1.2)

## 2014-04-10 LAB — BASIC METABOLIC PANEL
BUN: 11 mg/dL (ref 6–23)
CO2: 27 mEq/L (ref 19–32)
CREATININE: 0.6 mg/dL (ref 0.4–1.2)
Calcium: 9 mg/dL (ref 8.4–10.5)
Chloride: 101 mEq/L (ref 96–112)
GFR: 105.6 mL/min (ref >60.00)
Glucose, Bld: 84 mg/dL (ref 70–99)
POTASSIUM: 3.7 meq/L (ref 3.5–5.1)
Sodium: 138 mEq/L (ref 135–145)

## 2014-04-10 LAB — TSH: TSH: 1.2 u[IU]/mL (ref 0.35–4.50)

## 2014-04-17 ENCOUNTER — Encounter: Payer: Medicare Other | Admitting: Family Medicine

## 2014-04-17 ENCOUNTER — Encounter: Payer: Self-pay | Admitting: Family Medicine

## 2014-04-17 ENCOUNTER — Ambulatory Visit (INDEPENDENT_AMBULATORY_CARE_PROVIDER_SITE_OTHER): Payer: Medicare Other | Admitting: Family Medicine

## 2014-04-17 VITALS — BP 120/80 | Temp 98.1°F | Ht 61.5 in | Wt 158.0 lb

## 2014-04-17 DIAGNOSIS — F329 Major depressive disorder, single episode, unspecified: Secondary | ICD-10-CM

## 2014-04-17 DIAGNOSIS — I10 Essential (primary) hypertension: Secondary | ICD-10-CM

## 2014-04-17 DIAGNOSIS — K219 Gastro-esophageal reflux disease without esophagitis: Secondary | ICD-10-CM

## 2014-04-17 DIAGNOSIS — E785 Hyperlipidemia, unspecified: Secondary | ICD-10-CM

## 2014-04-17 DIAGNOSIS — F3289 Other specified depressive episodes: Secondary | ICD-10-CM

## 2014-04-17 MED ORDER — FLUOXETINE HCL 20 MG PO CAPS: 20.0000 mg | ORAL_CAPSULE | Freq: Every morning | ORAL | Status: DC

## 2014-04-17 MED ORDER — HYDROCHLOROTHIAZIDE 25 MG PO TABS: 25.0000 mg | ORAL_TABLET | Freq: Every morning | ORAL | Status: AC

## 2014-04-17 MED ORDER — LISINOPRIL 40 MG PO TABS: ORAL_TABLET | ORAL | Status: DC

## 2014-04-17 MED ORDER — LORAZEPAM 0.5 MG PO TABS: ORAL_TABLET | ORAL | Status: DC

## 2014-04-17 MED ORDER — POTASSIUM CHLORIDE CRYS ER 10 MEQ PO TBCR: 10.0000 meq | EXTENDED_RELEASE_TABLET | Freq: Two times a day (BID) | ORAL | Status: DC

## 2014-04-17 MED ORDER — LOVASTATIN 40 MG PO TABS: 40.0000 mg | ORAL_TABLET | Freq: Every day | ORAL | Status: AC

## 2014-04-17 MED ORDER — OMEPRAZOLE 40 MG PO CPDR: 40.0000 mg | DELAYED_RELEASE_CAPSULE | Freq: Every day | ORAL | Status: DC

## 2014-04-17 NOTE — Progress Notes (Signed)
Pre visit review using our clinic review tool, if applicable. No additional management support is needed unless otherwise documented below in the visit note. 

## 2014-04-17 NOTE — Patient Instructions (Signed)
Continue your current medications  Ativan 0.5........... one half tab each bedtime when necessary for sleep  Return in one year for general physical examination sooner if any problems...........Marland Kitchen good luck on your knee surgery  I would consider joining the [facility] and get involved with her water aerobics exercise program

## 2014-04-17 NOTE — Progress Notes (Signed)
   Subjective:    Patient ID: Marie Harrison, female    DOB: 1941-01-01, 73 y.o.   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 who comes in today for evaluation prior to surgery  She takes Prozac 20 mg daily refer history of mild depression  She takes hydrochlorothiazide 25 mg, lisinopril 80 daily for hypertension along with 20 mEq of potassium as a supplement. BP 120/80  She takes 10 mg of Claritin each bedtime when necessary for allergic rhinitis  She takes Mevacor 40 mg daily along with a baby aspirin for hyperlipidemia  She takes Prilosec 40 mg when necessary for reflux. She also has been given Xanax by somebody in the past. I declined to refill that for her. I gave her some low-dose Ativan to take each bedtime when necessary for sleep  She gets routine eye care, dental care, does not do BSE monthly, mammogram in July normal, colonoscopies have been normal in the past. She's due to have a followup in a couple days.  She scheduled for a  right TKR August 24 by Dr. Desmond Dike. Medically she is okay for surgery  Vaccinations updated by Apolonio Schneiders   Review of Systems  Constitutional: Negative.   HENT: Negative.   Eyes: Negative.   Respiratory: Negative.   Cardiovascular: Negative.   Gastrointestinal: Negative.   Genitourinary: Negative.   Musculoskeletal: Negative.   Neurological: Negative.   Psychiatric/Behavioral: Negative.        Objective:   Physical Exam  Constitutional: She appears well-developed and well-nourished.  HENT:  Head: Normocephalic and atraumatic.  Right Ear: External ear normal.  Left Ear: External ear normal.  Nose: Nose normal.  Mouth/Throat: Oropharynx is clear and moist.  Eyes: EOM are normal. Pupils are equal, round, and reactive to light.  Neck: Normal range of motion. Neck supple. No thyromegaly present.  Cardiovascular: Normal rate, regular rhythm, normal heart sounds and intact distal  pulses.  Exam reveals no gallop and no friction rub.   No murmur heard. Pulmonary/Chest: Effort normal and breath sounds normal.  Abdominal: Soft. Bowel sounds are normal. She exhibits no distension and no mass. There is no tenderness. There is no rebound.  Genitourinary:  Bilateral breast exam normal  She had a D&C about 5 years ago for some spotting it was negative she is asymptomatic therefore pelvics and Paps are not recommended  Musculoskeletal: Normal range of motion.  Lymphadenopathy:    She has no cervical adenopathy.  Neurological: She is alert. She has normal reflexes. No cranial nerve deficit. She exhibits normal muscle tone. Coordination normal.  Skin: Skin is warm and dry.  Total body skin exam normal except for freckles moles and a quarter size seborrheic keratosis 9:00 left breast,,,,,,,, benign but large and dark  Psychiatric: She has a normal mood and affect. Her behavior is normal. Judgment and thought content normal.          Assessment & Plan:  Healthy female  Hypertension at goal continue current therapy  History of depression continue Prozac 20  History of hyperlipidemia continue Mevacor and aspirin  Reflux esophagitis omeprazole when necessary  Sleep dysfunction Ativan 0.5 one half tab each bedtime when necessary  Degenerative joint disease,,,,,,,,,, impending knee replacement August 24 by Dr. Desmond Dike,,,,,,,, medically approved for surgery,

## 2014-04-18 ENCOUNTER — Telehealth: Payer: Self-pay | Admitting: Family Medicine

## 2014-04-18 ENCOUNTER — Encounter (HOSPITAL_COMMUNITY): Payer: Self-pay | Admitting: Pharmacy Technician

## 2014-04-18 NOTE — Telephone Encounter (Signed)
Relevant patient education assigned to patient using Emmi. ° °

## 2014-04-23 ENCOUNTER — Ambulatory Visit (HOSPITAL_COMMUNITY)
Admission: RE | Admit: 2014-04-23 | Discharge: 2014-04-23 | Disposition: A | Discharge status: Home / Self Care | Payer: Medicare Other | Source: Ambulatory Visit | Attending: Orthopedic Surgery | Admitting: Orthopedic Surgery

## 2014-04-23 ENCOUNTER — Encounter (HOSPITAL_COMMUNITY)
Admission: RE | Admit: 2014-04-23 | Discharge: 2014-04-23 | Disposition: A | Discharge status: Home / Self Care | Payer: Medicare Other | Source: Ambulatory Visit | Attending: Orthopedic Surgery | Admitting: Orthopedic Surgery

## 2014-04-23 ENCOUNTER — Encounter (HOSPITAL_COMMUNITY): Payer: Self-pay

## 2014-04-23 DIAGNOSIS — Z01818 Encounter for other preprocedural examination: Secondary | ICD-10-CM | POA: Diagnosis not present

## 2014-04-23 HISTORY — DX: Anxiety disorder, unspecified: F41.9

## 2014-04-23 LAB — URINALYSIS, ROUTINE W REFLEX MICROSCOPIC
Bilirubin Urine: NEGATIVE
Glucose, UA: NEGATIVE mg/dL
Hgb urine dipstick: NEGATIVE
KETONES UR: NEGATIVE mg/dL
Leukocytes, UA: NEGATIVE
NITRITE: NEGATIVE
PROTEIN: NEGATIVE mg/dL
Specific Gravity, Urine: 1.008 (ref 1.005–1.030)
UROBILINOGEN UA: 0.2 mg/dL (ref 0.0–1.0)
pH: 7 (ref 5.0–8.0)

## 2014-04-23 LAB — APTT: aPTT: 29 seconds (ref 24–37)

## 2014-04-23 LAB — SURGICAL PCR SCREEN
MRSA, PCR: NEGATIVE
Staphylococcus aureus: NEGATIVE

## 2014-04-23 LAB — PROTIME-INR
INR: 1.01 (ref 0.00–1.49)
Prothrombin Time: 13.3 seconds (ref 11.6–15.2)

## 2014-04-23 LAB — ABO/RH: ABO/RH(D): A POS

## 2014-04-23 NOTE — Progress Notes (Signed)
Clearance - Dr Sherren Mocha- 01/08/2014 on chart

## 2014-04-23 NOTE — Progress Notes (Addendum)
CBC/DIFF done 04/10/2014 in EPIC CMP done 04/10/14 in EPIC  Endoscopy done 04/19/14  - requested info from Dr Earlean Shawl office.   LOV with Dr Sherren Mocha 04/17/2014 EPIC

## 2014-04-23 NOTE — Patient Instructions (Signed)
Marie Harrison  04/23/2014   Your procedure is scheduled on:  04/30/14    Report to [facility].  Follow the Signs to [place] at  [phone]      am  Call this number if you have problems the morning of surgery: [phone]   Remember:   Do not eat food or drink liquids after midnight.   Take these medicines the morning of surgery with A SIP OF WATER:    Do not wear jewelry, make-up or nail polish.  Do not wear lotions, powders, or perfumes. , deodorant  Do not shave 48 hours prior to surgery.  Do not bring valuables to the hospital.  Contacts, dentures or bridgework may not be worn into surgery.  Leave suitcase in the car. After surgery it may be brought to your room.  For patients admitted to the hospital, checkout time is 11:00 AM the day of  discharge.      [facility] - Preparing for Surgery Before surgery, you can play an important role.  Because skin is not sterile, your skin needs to be as free of germs as possible.  You can reduce the number of germs on your skin by washing with CHG (chlorahexidine gluconate) soap before surgery.  CHG is an antiseptic cleaner which kills germs and bonds with the skin to continue killing germs even after washing. Please DO NOT use if you have an allergy to CHG or antibacterial soaps.  If your skin becomes reddened/irritated stop using the CHG and inform your nurse when you arrive at Short Stay. Do not shave (including legs and underarms) for at least 48 hours prior to the first CHG shower.  You may shave your face/neck. Please follow these instructions carefully:  1.  Shower with CHG Soap the night before surgery and the  morning of Surgery.  2.  If you choose to wash your hair, wash your hair first as usual with your  normal  shampoo.  3.  After you shampoo, rinse your hair and body thoroughly to remove the  shampoo.                           4.  Use CHG as you would any other liquid soap.  You can apply chg directly  to the skin  and wash                       Gently with a scrungie or clean washcloth.  5.  Apply the CHG Soap to your body ONLY FROM THE NECK DOWN.   Do not use on face/ open                           Wound or open sores. Avoid contact with eyes, ears mouth and genitals (private parts).                       Wash face,  Genitals (private parts) with your normal soap.             6.  Wash thoroughly, paying special attention to the area where your surgery  will be performed.  7.  Thoroughly rinse your body with warm water from the neck down.  8.  DO NOT shower/wash with your normal soap after using and rinsing off  the CHG Soap.  9.  Pat yourself dry with a clean towel.            10.  Wear clean pajamas.            11.  Place clean sheets on your bed the night of your first shower and do not  sleep with pets. Day of Surgery : Do not apply any lotions/deodorants the morning of surgery.  Please wear clean clothes to the hospital/surgery center.  FAILURE TO FOLLOW THESE INSTRUCTIONS MAY RESULT IN THE CANCELLATION OF YOUR SURGERY PATIENT SIGNATURE_________________________________  NURSE SIGNATURE__________________________________  ________________________________________________________________________ [facility] - Preparing for Surgery Before surgery, you can play an important role.  Because skin is not sterile, your skin needs to be as free of germs as possible.  You can reduce the number of germs on your skin by washing with CHG (chlorahexidine gluconate) soap before surgery.  CHG is an antiseptic cleaner which kills germs and bonds with the skin to continue killing germs even after washing. Please DO NOT use if you have an allergy to CHG or antibacterial soaps.  If your skin becomes reddened/irritated stop using the CHG and inform your nurse when you arrive at Short Stay. Do not shave (including legs and underarms) for at least 48 hours prior to the first CHG shower.  You may shave your  face/neck. Please follow these instructions carefully:  1.  Shower with CHG Soap the night before surgery and the  morning of Surgery.  2.  If you choose to wash your hair, wash your hair first as usual with your  normal  shampoo.  3.  After you shampoo, rinse your hair and body thoroughly to remove the  shampoo.                           4.  Use CHG as you would any other liquid soap.  You can apply chg directly  to the skin and wash                       Gently with a scrungie or clean washcloth.  5.  Apply the CHG Soap to your body ONLY FROM THE NECK DOWN.   Do not use on face/ open                           Wound or open sores. Avoid contact with eyes, ears mouth and genitals (private parts).                       Wash face,  Genitals (private parts) with your normal soap.             6.  Wash thoroughly, paying special attention to the area where your surgery  will be performed.  7.  Thoroughly rinse your body with warm water from the neck down.  8.  DO NOT shower/wash with your normal soap after using and rinsing off  the CHG Soap.                9.  Pat yourself dry with a clean towel.            10.  Wear clean pajamas.            11.  Place clean sheets on your bed the night of your first shower and do not  sleep with pets. Day  of Surgery : Do not apply any lotions/deodorants the morning of surgery.  Please wear clean clothes to the hospital/surgery center.  FAILURE TO FOLLOW THESE INSTRUCTIONS MAY RESULT IN THE CANCELLATION OF YOUR SURGERY PATIENT SIGNATURE_________________________________  NURSE SIGNATURE__________________________________  ________________________________________________________________________  WHAT IS A BLOOD TRANSFUSION? Blood Transfusion Information  A transfusion is the replacement of blood or some of its parts. Blood is made up of multiple cells which provide different functions.  Red blood cells carry oxygen and are used for blood loss  replacement.  White blood cells fight against infection.  Platelets control bleeding.  Plasma helps clot blood.  Other blood products are available for specialized needs, such as hemophilia or other clotting disorders. BEFORE THE TRANSFUSION  Who gives blood for transfusions?   Healthy volunteers who are fully evaluated to make sure their blood is safe. This is blood bank blood. Transfusion therapy is the safest it has ever been in the practice of medicine. Before blood is taken from a donor, a complete history is taken to make sure that person has no history of diseases nor engages in risky social behavior (examples are intravenous drug use or sexual activity with multiple partners). The donor's travel history is screened to minimize risk of transmitting infections, such as malaria. The donated blood is tested for signs of infectious diseases, such as HIV and hepatitis. The blood is then tested to be sure it is compatible with you in order to minimize the chance of a transfusion reaction. If you or a relative donates blood, this is often done in anticipation of surgery and is not appropriate for emergency situations. It takes many days to process the donated blood. RISKS AND COMPLICATIONS Although transfusion therapy is very safe and saves many lives, the main dangers of transfusion include:   Getting an infectious disease.  Developing a transfusion reaction. This is an allergic reaction to something in the blood you were given. Every precaution is taken to prevent this. The decision to have a blood transfusion has been considered carefully by your caregiver before blood is given. Blood is not given unless the benefits outweigh the risks. AFTER THE TRANSFUSION  Right after receiving a blood transfusion, you will usually feel much better and more energetic. This is especially true if your red blood cells have gotten low (anemic). The transfusion raises the level of the red blood cells which  carry oxygen, and this usually causes an energy increase.  The nurse administering the transfusion will monitor you carefully for complications. HOME CARE INSTRUCTIONS  No special instructions are needed after a transfusion. You may find your energy is better. Speak with your caregiver about any limitations on activity for underlying diseases you may have. SEEK MEDICAL CARE IF:   Your condition is not improving after your transfusion.  You develop redness or irritation at the intravenous (IV) site. SEEK IMMEDIATE MEDICAL CARE IF:  Any of the following symptoms occur over the next 12 hours:  Shaking chills.  You have a temperature by mouth above 102 F (38.9 C), not controlled by medicine.  Chest, back, or muscle pain.  People around you feel you are not acting correctly or are confused.  Shortness of breath or difficulty breathing.  Dizziness and fainting.  You get a rash or develop hives.  You have a decrease in urine output.  Your urine turns a dark color or changes to pink, red, or brown. Any of the following symptoms occur over the next 10 days:  You have a  temperature by mouth above 102 F (38.9 C), not controlled by medicine.  Shortness of breath.  Weakness after normal activity.  The white part of the eye turns yellow (jaundice).  You have a decrease in the amount of urine or are urinating less often.  Your urine turns a dark color or changes to pink, red, or brown. Document Released: 08/21/2000 Document Revised: 11/16/2011 Document Reviewed: 04/09/2008 ExitCare Patient Information 2014 [place].  _______________________________________________________________________  Incentive Spirometer  An incentive spirometer is a tool that can help keep your lungs clear and active. This tool measures how well you are filling your lungs with each breath. Taking long deep breaths may help reverse or decrease the chance of developing breathing (pulmonary) problems  (especially infection) following:  A long period of time when you are unable to move or be active. BEFORE THE PROCEDURE   If the spirometer includes an indicator to show your best effort, your nurse or respiratory therapist will set it to a desired goal.  If possible, sit up straight or lean slightly forward. Try not to slouch.  Hold the incentive spirometer in an upright position. INSTRUCTIONS FOR USE  1. Sit on the edge of your bed if possible, or sit up as far as you can in bed or on a chair. 2. Hold the incentive spirometer in an upright position. 3. Breathe out normally. 4. Place the mouthpiece in your mouth and seal your lips tightly around it. 5. Breathe in slowly and as deeply as possible, raising the piston or the ball toward the top of the column. 6. Hold your breath for 3-5 seconds or for as long as possible. Allow the piston or ball to fall to the bottom of the column. 7. Remove the mouthpiece from your mouth and breathe out normally. 8. Rest for a few seconds and repeat Steps 1 through 7 at least 10 times every 1-2 hours when you are awake. Take your time and take a few normal breaths between deep breaths. 9. The spirometer may include an indicator to show your best effort. Use the indicator as a goal to work toward during each repetition. 10. After each set of 10 deep breaths, practice coughing to be sure your lungs are clear. If you have an incision (the cut made at the time of surgery), support your incision when coughing by placing a pillow or rolled up towels firmly against it. Once you are able to get out of bed, walk around indoors and cough well. You may stop using the incentive spirometer when instructed by your caregiver.  RISKS AND COMPLICATIONS  Take your time so you do not get dizzy or light-headed.  If you are in pain, you may need to take or ask for pain medication before doing incentive spirometry. It is harder to take a deep breath if you are having  pain. AFTER USE  Rest and breathe slowly and easily.  It can be helpful to keep track of a log of your progress. Your caregiver can provide you with a simple table to help with this. If you are using the spirometer at home, follow these instructions: [place] IF:   You are having difficultly using the spirometer.  You have trouble using the spirometer as often as instructed.  Your pain medication is not giving enough relief while using the spirometer.  You develop fever of 100.5 F (38.1 C) or higher. SEEK IMMEDIATE MEDICAL CARE IF:   You cough up bloody sputum that had not  been present before.  You develop fever of 102 F (38.9 C) or greater.  You develop worsening pain at or near the incision site. MAKE SURE YOU:   Understand these instructions.  Will watch your condition.  Will get help right away if you are not doing well or get worse. Document Released: 01/04/2007 Document Revised: 11/16/2011 Document Reviewed: 03/07/2007 ExitCare Patient Information 2014 ExitCare, [place].   ________________________________________________________________________    Please read over the following fact sheets that you were given: MRSA Information, coughing and deep breathing exercises, leg exercises

## 2014-04-29 ENCOUNTER — Ambulatory Visit: Payer: Self-pay | Admitting: Orthopedic Surgery

## 2014-04-29 NOTE — H&P (Signed)
Marie Harrison DOB: 27-Nov-1940 Married / Language: English / Race: White Female Date of Admission:  04-30-2014 Chief Complaint: Right Knee Pain History of Present Illness The patient is a 73 year old female who comes in for a preoperative History and Physical. The patient is scheduled for a right total knee arthroplasty to be performed by Dr. Dione Plover. Aluisio, MD at [facility] on 04/30/2014. The patient is being followed for their bilateral knee pain. She is now several month(s) out from Visco injections. The injections did not provide significant releif. Symptoms reported today include: pain, swelling (slightly), aching and stiffness. and report their pain level to be 6 / 10. Current treatment includes: Prednisone for eye issues which has helped the knee as well. The patient has reported improvement of their symptoms with: viscosupplementation which only helped for about 2 months. The patient feels that they are doing poorly. The following medication has been used for pain control: antiinflammatory medication (Ibuprofen). The patient had questions regarding surgery. Patient states that she has noticed some improvement, but still painful. The right is more painful in comparison to the left. She said that she stood in one place too long a few weeks ago and had a terrible pain in her right leg. She said that it lasted a few days and then went away. She is now ready to proceed witht he knee repalcement at this time.  She has been doing home exercises in preparation for the surgery. They have been treated conservatively in the past for the above stated problem and despite conservative measures, they continue to have progressive pain and severe functional limitations and dysfunction. They have failed non-operative management including home exercise, medications, and injections. It is felt that they would benefit from undergoing total joint replacement. Risks and benefits of the procedure have been  discussed with the patient and they elect to proceed with surgery. There are no active contraindications to surgery such as ongoing infection or rapidly progressive neurological disease.  Allergies Codeine Derivatives Itching, Nausea.  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 mother Depression grandmother mothers side Hypertension father and grandmother mothers side Congestive Heart Failure Father. father Osteoporosis mother Heart Disease father and grandmother mothers side  Social History Alcohol use current drinker; drinks beer and wine; only occasionally per week Drug/Alcohol Rehab (Previously) no Exercise Exercises rarely; does other Tobacco use Former smoker. former smoker; smoke(d) less than 1/2 pack(s) per day Illicit drug use no Pain Contract no Tobacco / smoke exposure no Living situation live with spouse Children 2 Current work status retired Engineer, agricultural (Currently) no Marital status  married Number of flights of stairs before winded 1 Post-Surgical Plans Home  Medication History  Claritin (Oral) Specific dose unknown - Active. FLUoxetine HCl (20MG  Capsule, Oral) Active. Hydrochlorothiazide (25MG  Tablet, Oral) Active. Lovastatin (  40MG  Tablet, Oral) Active. Potassium Chloride CR (10MEQ Capsule ER, Oral) Active. Aspirin (81MG  Tablet, 1 Oral) Active. Omeprazole (40MG  Capsule DR, Oral) Active. (prn) Xanax (0.5MG  Tablet, Oral) Active. (prn) Lisinopril (Oral) Specific dose unknown - Active. (80 mg)  Past Surgical History Dilation and Curettage of Uterus Foot Surgery right Tonsillectomy Arthroscopy of Knee Left Arthroscopic Knee Surgery - Right Cataract Surgery Bilateral  Review of Systems  General Not Present- Chills, Fatigue, Fever, Memory Loss, Night Sweats, Weight Gain and Weight Loss. Skin Not Present- Eczema, Hives, Itching, Lesions and Rash. HEENT Present- Tinnitus. Not Present- Dentures, Double Vision, Headache, Hearing Loss and Visual Loss. Respiratory Not Present- Allergies, Chronic Cough, Coughing up blood, Shortness of breath at rest and Shortness of breath with exertion. Cardiovascular Present- Swelling. Not Present- Chest Pain, Difficulty Breathing Lying Down, Murmur, Palpitations and Racing/skipping heartbeats. Gastrointestinal Present- Constipation and Diarrhea. Not Present- Abdominal Pain, Bloody Stool, Difficulty Swallowing, Heartburn, Jaundice, Loss of appetitie, Nausea and Vomiting. Female Genitourinary Present- Incontinence (mild). Not Present- Blood in Urine, Discharge, Flank Pain, Painful Urination, Urgency, Urinary frequency, Urinary Retention, Urinating at Night and Weak urinary stream. Musculoskeletal Present- Joint Swelling, Morning Stiffness and Muscle Pain. Not Present- Back Pain, Joint Pain, Muscle Weakness and Spasms. Neurological Not Present- Blackout spells, Difficulty with balance, Dizziness, Paralysis, Tremor and  Weakness. Psychiatric Not Present- Insomnia.   Vitals  Height: 61in Height was reported by patient. BP: 136/78 (Sitting, Right Arm, Standard)    Physical Exam  General Mental Status -Alert, cooperative and good historian. General Appearance-pleasant, Not in acute distress. Orientation-Oriented X3. Build & Nutrition-Well nourished and Well developed.  Head and Neck Head-normocephalic, atraumatic . Neck Global Assessment - supple, no bruit auscultated on the right, no bruit auscultated on the left. Note: upper and lower partial dentures   Eye Pupil - Bilateral-Regular and Round. Motion - Bilateral-EOMI.  Chest and Lung Exam Auscultation Breath sounds - clear at anterior chest wall and clear at posterior chest wall. Adventitious sounds - No Adventitious sounds.  Cardiovascular Auscultation Rhythm - Regular rate and rhythm. Heart Sounds - S1 WNL and S2 WNL. Murmurs & Other Heart Sounds - Auscultation of the heart reveals - No Murmurs.  Abdomen Palpation/Percussion Tenderness - Abdomen is non-tender to palpation. Rigidity (guarding) - Abdomen is soft. Auscultation Auscultation of the abdomen reveals - Bowel sounds normal.  Female Genitourinary Note: Not done, not pertinent to present illness   Musculoskeletal Note: Right Lower Extremity: Right Hip: Strength and Tone - Hip Flexors - 4+/5. Abductors - 4+/5. Adductors - 5/5. Hip Extensors - 4+/5. Right Knee: Inspection and Palpation - Tenderness - anterior knee tender to palpation. Strength and Tone - Quadriceps - 4+/5. Hamstrings - 4+/5. ROM: Flexion - AROM - 121 . PROM - 125 . Extension - AROM - 3 . PROM - 0 . Right Knee - Stability - No instability about the knee. Gait and Station - Abnormal Gait Patterns - antalgic gait(mild). Assistive Devices - no assistive devices.   Assessment & Plan  Osteoarthritis of right knee (715.96  M17.9) Note:Plan is for a Right Total Knee Replacement by Dr.  Wynelle Link.  Plan is to go home  PCP - Dr. Sherren Mocha - Patient has been seen preoperatively and felt to be stable for surgery.  The patient will receive topical TXA (tranexamic acid) due to: CVA  Signed electronically by Joelene Millin, III PA-C

## 2014-04-30 ENCOUNTER — Encounter (HOSPITAL_COMMUNITY): Payer: Medicare Other | Admitting: Anesthesiology

## 2014-04-30 ENCOUNTER — Encounter (HOSPITAL_COMMUNITY)
Admission: RE | Disposition: A | Discharge status: Skilled Nursing Facility | Payer: Self-pay | Source: Ambulatory Visit | Attending: Orthopedic Surgery

## 2014-04-30 ENCOUNTER — Inpatient Hospital Stay (HOSPITAL_COMMUNITY)
Admission: RE | Admit: 2014-04-30 | Discharge: 2014-05-03 | DRG: 470 | Disposition: A | Discharge status: Skilled Nursing Facility | Payer: Medicare Other | Source: Ambulatory Visit | Attending: Orthopedic Surgery | Admitting: Orthopedic Surgery

## 2014-04-30 ENCOUNTER — Encounter (HOSPITAL_COMMUNITY): Payer: Self-pay | Admitting: *Deleted

## 2014-04-30 ENCOUNTER — Inpatient Hospital Stay (HOSPITAL_COMMUNITY): Payer: Medicare Other | Admitting: Anesthesiology

## 2014-04-30 DIAGNOSIS — Z6829 Body mass index (BMI) 29.0-29.9, adult: Secondary | ICD-10-CM

## 2014-04-30 DIAGNOSIS — K219 Gastro-esophageal reflux disease without esophagitis: Secondary | ICD-10-CM | POA: Diagnosis present

## 2014-04-30 DIAGNOSIS — M1710 Unilateral primary osteoarthritis, unspecified knee: Principal | ICD-10-CM | POA: Diagnosis present

## 2014-04-30 DIAGNOSIS — E871 Hypo-osmolality and hyponatremia: Secondary | ICD-10-CM | POA: Diagnosis not present

## 2014-04-30 DIAGNOSIS — F411 Generalized anxiety disorder: Secondary | ICD-10-CM | POA: Diagnosis present

## 2014-04-30 DIAGNOSIS — M179 Osteoarthritis of knee, unspecified: Secondary | ICD-10-CM | POA: Diagnosis present

## 2014-04-30 DIAGNOSIS — E876 Hypokalemia: Secondary | ICD-10-CM | POA: Diagnosis not present

## 2014-04-30 DIAGNOSIS — IMO0001 Reserved for inherently not codable concepts without codable children: Secondary | ICD-10-CM | POA: Diagnosis present

## 2014-04-30 DIAGNOSIS — M25569 Pain in unspecified knee: Secondary | ICD-10-CM | POA: Diagnosis present

## 2014-04-30 DIAGNOSIS — Z823 Family history of stroke: Secondary | ICD-10-CM

## 2014-04-30 DIAGNOSIS — Z8249 Family history of ischemic heart disease and other diseases of the circulatory system: Secondary | ICD-10-CM

## 2014-04-30 DIAGNOSIS — Z8673 Personal history of transient ischemic attack (TIA), and cerebral infarction without residual deficits: Secondary | ICD-10-CM | POA: Diagnosis not present

## 2014-04-30 DIAGNOSIS — F3289 Other specified depressive episodes: Secondary | ICD-10-CM | POA: Diagnosis present

## 2014-04-30 DIAGNOSIS — Z833 Family history of diabetes mellitus: Secondary | ICD-10-CM

## 2014-04-30 DIAGNOSIS — I10 Essential (primary) hypertension: Secondary | ICD-10-CM | POA: Diagnosis present

## 2014-04-30 DIAGNOSIS — E7800 Pure hypercholesterolemia, unspecified: Secondary | ICD-10-CM | POA: Diagnosis present

## 2014-04-30 DIAGNOSIS — E785 Hyperlipidemia, unspecified: Secondary | ICD-10-CM | POA: Diagnosis present

## 2014-04-30 DIAGNOSIS — Z96651 Presence of right artificial knee joint: Secondary | ICD-10-CM

## 2014-04-30 DIAGNOSIS — F329 Major depressive disorder, single episode, unspecified: Secondary | ICD-10-CM | POA: Diagnosis present

## 2014-04-30 DIAGNOSIS — Z8679 Personal history of other diseases of the circulatory system: Secondary | ICD-10-CM

## 2014-04-30 DIAGNOSIS — M1711 Unilateral primary osteoarthritis, right knee: Secondary | ICD-10-CM

## 2014-04-30 DIAGNOSIS — F32A Depression, unspecified: Secondary | ICD-10-CM | POA: Diagnosis present

## 2014-04-30 HISTORY — PX: TOTAL KNEE ARTHROPLASTY: SHX125

## 2014-04-30 LAB — TYPE AND SCREEN
ABO/RH(D): A POS
Antibody Screen: NEGATIVE

## 2014-04-30 SURGERY — ARTHROPLASTY, KNEE, TOTAL
Anesthesia: Spinal | Site: Knee | Laterality: Right

## 2014-04-30 MED ORDER — HYDROMORPHONE HCL 2 MG PO TABS
2.0000 mg | ORAL_TABLET | ORAL | Status: DC | PRN
  Administered 2014-04-30 (×2): 2 mg via ORAL
  Administered 2014-05-01 (×4): 4 mg via ORAL
  Administered 2014-05-02: 2 mg via ORAL
  Administered 2014-05-02 – 2014-05-03 (×8): 4 mg via ORAL
  Filled 2014-04-30 (×2): qty 2
  Filled 2014-04-30 (×2): qty 1
  Filled 2014-04-30 (×3): qty 2
  Filled 2014-04-30: qty 1
  Filled 2014-04-30 (×6): qty 2
  Filled 2014-04-30: qty 1

## 2014-04-30 MED ORDER — CHLORHEXIDINE GLUCONATE 4 % EX LIQD: 60.0000 mL | Freq: Once | CUTANEOUS | Status: DC

## 2014-04-30 MED ORDER — ONDANSETRON HCL 4 MG PO TABS: 4.0000 mg | ORAL_TABLET | Freq: Four times a day (QID) | ORAL | Status: DC | PRN

## 2014-04-30 MED ORDER — MIDAZOLAM HCL 5 MG/5ML IJ SOLN
INTRAMUSCULAR | Status: DC | PRN
  Administered 2014-04-30: 2 mg via INTRAVENOUS

## 2014-04-30 MED ORDER — CEFAZOLIN SODIUM-DEXTROSE 2-3 GM-% IV SOLR
INTRAVENOUS | Status: AC
  Filled 2014-04-30: qty 50

## 2014-04-30 MED ORDER — ACETAMINOPHEN 650 MG RE SUPP: 650.0000 mg | Freq: Four times a day (QID) | RECTAL | Status: DC | PRN

## 2014-04-30 MED ORDER — PHENYLEPHRINE 40 MCG/ML (10ML) SYRINGE FOR IV PUSH (FOR BLOOD PRESSURE SUPPORT)
PREFILLED_SYRINGE | INTRAVENOUS | Status: AC
  Filled 2014-04-30: qty 10

## 2014-04-30 MED ORDER — BUPIVACAINE IN DEXTROSE 0.75-8.25 % IT SOLN
INTRATHECAL | Status: DC | PRN
  Administered 2014-04-30: 2 mL via INTRATHECAL

## 2014-04-30 MED ORDER — SODIUM CHLORIDE 0.9 % IJ SOLN
INTRAMUSCULAR | Status: DC | PRN
  Administered 2014-04-30: 30 mL

## 2014-04-30 MED ORDER — PHENOL 1.4 % MT LIQD
1.0000 | OROMUCOSAL | Status: DC | PRN
Start: 2014-04-30 — End: 2014-05-03

## 2014-04-30 MED ORDER — CEFAZOLIN SODIUM-DEXTROSE 2-3 GM-% IV SOLR
2.0000 g | INTRAVENOUS | Status: AC
  Administered 2014-04-30: 2 g via INTRAVENOUS

## 2014-04-30 MED ORDER — PROPOFOL INFUSION 10 MG/ML OPTIME
INTRAVENOUS | Status: DC | PRN
  Administered 2014-04-30: 120 ug/kg/min via INTRAVENOUS

## 2014-04-30 MED ORDER — DEXAMETHASONE SODIUM PHOSPHATE 10 MG/ML IJ SOLN
10.0000 mg | Freq: Once | INTRAMUSCULAR | Status: AC
  Administered 2014-04-30: 10 mg via INTRAVENOUS

## 2014-04-30 MED ORDER — HYDROCHLOROTHIAZIDE 25 MG PO TABS
25.0000 mg | ORAL_TABLET | Freq: Every morning | ORAL | Status: DC
  Administered 2014-04-30 – 2014-05-03 (×4): 25 mg via ORAL
  Filled 2014-04-30 (×4): qty 1

## 2014-04-30 MED ORDER — CEFAZOLIN SODIUM 1-5 GM-% IV SOLN
1.0000 g | Freq: Four times a day (QID) | INTRAVENOUS | Status: AC
  Administered 2014-04-30 (×2): 1 g via INTRAVENOUS
  Filled 2014-04-30 (×2): qty 50

## 2014-04-30 MED ORDER — BUPIVACAINE HCL (PF) 0.25 % IJ SOLN
INTRAMUSCULAR | Status: AC
  Filled 2014-04-30: qty 30

## 2014-04-30 MED ORDER — SODIUM CHLORIDE 0.9 % IJ SOLN
INTRAMUSCULAR | Status: AC
  Filled 2014-04-30: qty 50

## 2014-04-30 MED ORDER — FENTANYL CITRATE 0.05 MG/ML IJ SOLN
INTRAMUSCULAR | Status: DC | PRN
  Administered 2014-04-30: 50 ug via INTRAVENOUS

## 2014-04-30 MED ORDER — DEXAMETHASONE SODIUM PHOSPHATE 10 MG/ML IJ SOLN
10.0000 mg | Freq: Every day | INTRAMUSCULAR | Status: AC
  Filled 2014-04-30: qty 1

## 2014-04-30 MED ORDER — DEXAMETHASONE SODIUM PHOSPHATE 10 MG/ML IJ SOLN
INTRAMUSCULAR | Status: AC
  Filled 2014-04-30: qty 1

## 2014-04-30 MED ORDER — BUPIVACAINE LIPOSOME 1.3 % IJ SUSP
20.0000 mL | Freq: Once | INTRAMUSCULAR | Status: DC
  Filled 2014-04-30: qty 20

## 2014-04-30 MED ORDER — ONDANSETRON HCL 4 MG/2ML IJ SOLN: 4.0000 mg | Freq: Four times a day (QID) | INTRAMUSCULAR | Status: DC | PRN

## 2014-04-30 MED ORDER — ACETAMINOPHEN 500 MG PO TABS
1000.0000 mg | ORAL_TABLET | Freq: Four times a day (QID) | ORAL | Status: AC
  Administered 2014-04-30 – 2014-05-01 (×4): 1000 mg via ORAL
  Filled 2014-04-30 (×4): qty 2

## 2014-04-30 MED ORDER — HYDROMORPHONE HCL PF 1 MG/ML IJ SOLN
0.5000 mg | INTRAMUSCULAR | Status: DC | PRN
  Administered 2014-04-30 (×2): 0.5 mg via INTRAVENOUS
  Administered 2014-04-30 – 2014-05-01 (×2): 1 mg via INTRAVENOUS
  Administered 2014-05-01: 0.5 mg via INTRAVENOUS
  Filled 2014-04-30 (×5): qty 1

## 2014-04-30 MED ORDER — TRAMADOL HCL 50 MG PO TABS: 50.0000 mg | ORAL_TABLET | Freq: Four times a day (QID) | ORAL | Status: DC | PRN

## 2014-04-30 MED ORDER — METOCLOPRAMIDE HCL 5 MG/ML IJ SOLN: 5.0000 mg | Freq: Three times a day (TID) | INTRAMUSCULAR | Status: DC | PRN

## 2014-04-30 MED ORDER — KETOROLAC TROMETHAMINE 15 MG/ML IJ SOLN
7.5000 mg | Freq: Four times a day (QID) | INTRAMUSCULAR | Status: AC | PRN
  Administered 2014-04-30 – 2014-05-01 (×2): 7.5 mg via INTRAVENOUS
  Filled 2014-04-30 (×3): qty 1

## 2014-04-30 MED ORDER — DIPHENHYDRAMINE HCL 12.5 MG/5ML PO ELIX: 12.5000 mg | ORAL_SOLUTION | ORAL | Status: DC | PRN

## 2014-04-30 MED ORDER — DOCUSATE SODIUM 100 MG PO CAPS
100.0000 mg | ORAL_CAPSULE | Freq: Two times a day (BID) | ORAL | Status: DC
  Administered 2014-04-30 – 2014-05-03 (×6): 100 mg via ORAL

## 2014-04-30 MED ORDER — RIVAROXABAN 10 MG PO TABS
10.0000 mg | ORAL_TABLET | Freq: Every day | ORAL | Status: DC
  Administered 2014-05-01 – 2014-05-03 (×3): 10 mg via ORAL
  Filled 2014-04-30 (×4): qty 1

## 2014-04-30 MED ORDER — SODIUM CHLORIDE 0.9 % IR SOLN
Status: DC | PRN
  Administered 2014-04-30: 1000 mL

## 2014-04-30 MED ORDER — PROPOFOL 10 MG/ML IV BOLUS
INTRAVENOUS | Status: AC
  Filled 2014-04-30: qty 20

## 2014-04-30 MED ORDER — POTASSIUM CHLORIDE CRYS ER 10 MEQ PO TBCR
10.0000 meq | EXTENDED_RELEASE_TABLET | Freq: Two times a day (BID) | ORAL | Status: DC
  Administered 2014-04-30 (×2): 10 meq via ORAL
  Filled 2014-04-30 (×4): qty 1

## 2014-04-30 MED ORDER — LORAZEPAM 0.5 MG PO TABS
0.2500 mg | ORAL_TABLET | Freq: Every evening | ORAL | Status: DC | PRN
  Administered 2014-05-01: 0.25 mg via ORAL
  Filled 2014-04-30: qty 1

## 2014-04-30 MED ORDER — LACTATED RINGERS IV SOLN
INTRAVENOUS | Status: DC
  Administered 2014-04-30: 1000 mL via INTRAVENOUS

## 2014-04-30 MED ORDER — METOCLOPRAMIDE HCL 10 MG PO TABS
5.0000 mg | ORAL_TABLET | Freq: Three times a day (TID) | ORAL | Status: DC | PRN
  Administered 2014-05-01: 10 mg via ORAL
  Filled 2014-04-30: qty 1

## 2014-04-30 MED ORDER — FENTANYL CITRATE 0.05 MG/ML IJ SOLN
INTRAMUSCULAR | Status: AC
  Filled 2014-04-30: qty 2

## 2014-04-30 MED ORDER — LACTATED RINGERS IV SOLN: INTRAVENOUS | Status: DC

## 2014-04-30 MED ORDER — TRANEXAMIC ACID 100 MG/ML IV SOLN
1000.0000 mg | INTRAVENOUS | Status: DC | PRN
  Administered 2014-04-30: 1000 mg via INTRAVENOUS

## 2014-04-30 MED ORDER — PROMETHAZINE HCL 25 MG/ML IJ SOLN: 6.2500 mg | INTRAMUSCULAR | Status: DC | PRN

## 2014-04-30 MED ORDER — DEXAMETHASONE 6 MG PO TABS
10.0000 mg | ORAL_TABLET | Freq: Every day | ORAL | Status: AC
  Administered 2014-05-01: 10 mg via ORAL
  Filled 2014-04-30: qty 1

## 2014-04-30 MED ORDER — PHENYLEPHRINE HCL 10 MG/ML IJ SOLN
INTRAMUSCULAR | Status: DC | PRN
  Administered 2014-04-30 (×2): 80 ug via INTRAVENOUS

## 2014-04-30 MED ORDER — POLYETHYLENE GLYCOL 3350 17 G PO PACK
17.0000 g | PACK | Freq: Every day | ORAL | Status: DC | PRN
  Administered 2014-05-02: 17 g via ORAL

## 2014-04-30 MED ORDER — HYDROMORPHONE HCL PF 1 MG/ML IJ SOLN: 0.2500 mg | INTRAMUSCULAR | Status: DC | PRN

## 2014-04-30 MED ORDER — MIDAZOLAM HCL 2 MG/2ML IJ SOLN
INTRAMUSCULAR | Status: AC
  Filled 2014-04-30: qty 2

## 2014-04-30 MED ORDER — SODIUM CHLORIDE 0.9 % IV SOLN: INTRAVENOUS | Status: DC

## 2014-04-30 MED ORDER — FLUOXETINE HCL 20 MG PO CAPS
20.0000 mg | ORAL_CAPSULE | Freq: Every morning | ORAL | Status: DC
  Administered 2014-05-01 – 2014-05-03 (×3): 20 mg via ORAL
  Filled 2014-04-30 (×4): qty 1

## 2014-04-30 MED ORDER — LACTATED RINGERS IV SOLN
INTRAVENOUS | Status: DC | PRN
  Administered 2014-04-30 (×2): via INTRAVENOUS

## 2014-04-30 MED ORDER — MENTHOL 3 MG MT LOZG: 1.0000 | LOZENGE | OROMUCOSAL | Status: DC | PRN

## 2014-04-30 MED ORDER — TRANEXAMIC ACID 100 MG/ML IV SOLN
2000.0000 mg | Freq: Once | INTRAVENOUS | Status: DC
  Filled 2014-04-30: qty 20

## 2014-04-30 MED ORDER — ACETAMINOPHEN 10 MG/ML IV SOLN
1000.0000 mg | Freq: Once | INTRAVENOUS | Status: AC
  Administered 2014-04-30: 1000 mg via INTRAVENOUS
  Filled 2014-04-30: qty 100

## 2014-04-30 MED ORDER — STERILE WATER FOR IRRIGATION IR SOLN
Status: DC | PRN
  Administered 2014-04-30: 1500 mL

## 2014-04-30 MED ORDER — FLEET ENEMA 7-19 GM/118ML RE ENEM: 1.0000 | ENEMA | Freq: Once | RECTAL | Status: AC | PRN

## 2014-04-30 MED ORDER — BUPIVACAINE LIPOSOME 1.3 % IJ SUSP
INTRAMUSCULAR | Status: DC | PRN
  Administered 2014-04-30: 20 mL

## 2014-04-30 MED ORDER — PROPOFOL 10 MG/ML IV BOLUS
INTRAVENOUS | Status: DC | PRN
  Administered 2014-04-30: 30 mg via INTRAVENOUS

## 2014-04-30 MED ORDER — KCL IN DEXTROSE-NACL 20-5-0.9 MEQ/L-%-% IV SOLN
INTRAVENOUS | Status: DC
  Administered 2014-04-30 – 2014-05-01 (×3): via INTRAVENOUS
  Filled 2014-04-30 (×3): qty 1000

## 2014-04-30 MED ORDER — BUPIVACAINE HCL 0.25 % IJ SOLN
INTRAMUSCULAR | Status: DC | PRN
  Administered 2014-04-30: 20 mL

## 2014-04-30 MED ORDER — 0.9 % SODIUM CHLORIDE (POUR BTL) OPTIME
TOPICAL | Status: DC | PRN
  Administered 2014-04-30: 1000 mL

## 2014-04-30 MED ORDER — ACETAMINOPHEN 325 MG PO TABS
650.0000 mg | ORAL_TABLET | Freq: Four times a day (QID) | ORAL | Status: DC | PRN
  Filled 2014-04-30: qty 2

## 2014-04-30 MED ORDER — LORATADINE 10 MG PO TABS
10.0000 mg | ORAL_TABLET | Freq: Every day | ORAL | Status: DC | PRN
  Filled 2014-04-30: qty 1

## 2014-04-30 MED ORDER — METHOCARBAMOL 1000 MG/10ML IJ SOLN
500.0000 mg | Freq: Four times a day (QID) | INTRAVENOUS | Status: DC | PRN
  Administered 2014-04-30: 500 mg via INTRAVENOUS
  Filled 2014-04-30: qty 5

## 2014-04-30 MED ORDER — PANTOPRAZOLE SODIUM 40 MG PO TBEC
80.0000 mg | DELAYED_RELEASE_TABLET | Freq: Every day | ORAL | Status: DC
  Filled 2014-04-30: qty 2

## 2014-04-30 MED ORDER — BISACODYL 10 MG RE SUPP: 10.0000 mg | Freq: Every day | RECTAL | Status: DC | PRN

## 2014-04-30 MED ORDER — METHOCARBAMOL 500 MG PO TABS
500.0000 mg | ORAL_TABLET | Freq: Four times a day (QID) | ORAL | Status: DC | PRN
Start: 2014-04-30 — End: 2014-05-03
  Administered 2014-04-30 – 2014-05-03 (×8): 500 mg via ORAL
  Filled 2014-04-30 (×8): qty 1

## 2014-04-30 SURGICAL SUPPLY — 60 items
BAG SPEC THK2 15X12 ZIP CLS (MISCELLANEOUS) ×1
BAG ZIPLOCK 12X15 (MISCELLANEOUS) ×2 IMPLANT
BANDAGE ELASTIC 6 VELCRO ST LF (GAUZE/BANDAGES/DRESSINGS) ×2 IMPLANT
BANDAGE ESMARK 6X9 LF (GAUZE/BANDAGES/DRESSINGS) ×1 IMPLANT
BLADE SAG 18X100X1.27 (BLADE) ×2 IMPLANT
BLADE SAW SGTL 11.0X1.19X90.0M (BLADE) ×2 IMPLANT
BNDG CMPR 9X6 STRL LF SNTH (GAUZE/BANDAGES/DRESSINGS) ×1
BNDG ESMARK 6X9 LF (GAUZE/BANDAGES/DRESSINGS) ×2
BOWL SMART MIX CTS (DISPOSABLE) ×2 IMPLANT
CAPT RP KNEE ×1 IMPLANT
CEMENT HV SMART SET (Cement) ×4 IMPLANT
CUFF TOURN SGL QUICK 34 (TOURNIQUET CUFF) ×2
CUFF TRNQT CYL 34X4X40X1 (TOURNIQUET CUFF) ×1 IMPLANT
DECANTER SPIKE VIAL GLASS SM (MISCELLANEOUS) ×2 IMPLANT
DRAPE EXTREMITY T 121X128X90 (DRAPE) ×2 IMPLANT
DRAPE POUCH INSTRU U-SHP 10X18 (DRAPES) ×2 IMPLANT
DRAPE U-SHAPE 47X51 STRL (DRAPES) ×2 IMPLANT
DRSG ADAPTIC 3X8 NADH LF (GAUZE/BANDAGES/DRESSINGS) ×2 IMPLANT
DRSG PAD ABDOMINAL 8X10 ST (GAUZE/BANDAGES/DRESSINGS) ×2 IMPLANT
DURAPREP 26ML APPLICATOR (WOUND CARE) ×2 IMPLANT
ELECT REM PT RETURN 9FT ADLT (ELECTROSURGICAL) ×2
ELECTRODE REM PT RTRN 9FT ADLT (ELECTROSURGICAL) ×1 IMPLANT
EVACUATOR 1/8 PVC DRAIN (DRAIN) ×2 IMPLANT
FACESHIELD WRAPAROUND (MASK) ×10 IMPLANT
FACESHIELD WRAPAROUND OR TEAM (MASK) ×5 IMPLANT
GAUZE SPONGE 4X4 12PLY STRL (GAUZE/BANDAGES/DRESSINGS) ×2 IMPLANT
GLOVE BIO SURGEON STRL SZ7.5 (GLOVE) IMPLANT
GLOVE BIO SURGEON STRL SZ8 (GLOVE) ×2 IMPLANT
GLOVE BIOGEL PI IND STRL 6.5 (GLOVE) IMPLANT
GLOVE BIOGEL PI IND STRL 8 (GLOVE) ×1 IMPLANT
GLOVE BIOGEL PI INDICATOR 6.5 (GLOVE)
GLOVE BIOGEL PI INDICATOR 8 (GLOVE) ×1
GLOVE SURG SS PI 6.5 STRL IVOR (GLOVE) IMPLANT
GOWN STRL REUS W/TWL LRG LVL3 (GOWN DISPOSABLE) ×2 IMPLANT
GOWN STRL REUS W/TWL XL LVL3 (GOWN DISPOSABLE) IMPLANT
HANDPIECE INTERPULSE COAX TIP (DISPOSABLE) ×2
IMMOBILIZER KNEE 20 (SOFTGOODS) ×2
IMMOBILIZER KNEE 20 THIGH 36 (SOFTGOODS) ×1 IMPLANT
KIT BASIN OR (CUSTOM PROCEDURE TRAY) ×2 IMPLANT
MANIFOLD NEPTUNE II (INSTRUMENTS) ×2 IMPLANT
NDL SAFETY ECLIPSE 18X1.5 (NEEDLE) ×2 IMPLANT
NEEDLE HYPO 18GX1.5 SHARP (NEEDLE) ×4
NS IRRIG 1000ML POUR BTL (IV SOLUTION) ×2 IMPLANT
PACK TOTAL JOINT (CUSTOM PROCEDURE TRAY) ×2 IMPLANT
PADDING CAST COTTON 6X4 STRL (CAST SUPPLIES) ×4 IMPLANT
POSITIONER SURGICAL ARM (MISCELLANEOUS) ×2 IMPLANT
SET HNDPC FAN SPRY TIP SCT (DISPOSABLE) ×1 IMPLANT
STRIP CLOSURE SKIN 1/2X4 (GAUZE/BANDAGES/DRESSINGS) ×4 IMPLANT
SUCTION FRAZIER 12FR DISP (SUCTIONS) ×2 IMPLANT
SUT MNCRL AB 4-0 PS2 18 (SUTURE) ×2 IMPLANT
SUT VIC AB 2-0 CT1 27 (SUTURE) ×6
SUT VIC AB 2-0 CT1 TAPERPNT 27 (SUTURE) ×3 IMPLANT
SUT VLOC 180 0 24IN GS25 (SUTURE) ×2 IMPLANT
SYRINGE 20CC LL (MISCELLANEOUS) ×2 IMPLANT
SYRINGE 60CC LL (MISCELLANEOUS) ×2 IMPLANT
TOWEL OR 17X26 10 PK STRL BLUE (TOWEL DISPOSABLE) ×2 IMPLANT
TOWEL OR NON WOVEN STRL DISP B (DISPOSABLE) IMPLANT
TRAY FOLEY CATH 14FRSI W/METER (CATHETERS) ×2 IMPLANT
WATER STERILE IRR 1500ML POUR (IV SOLUTION) ×2 IMPLANT
WRAP KNEE MAXI GEL POST OP (GAUZE/BANDAGES/DRESSINGS) ×2 IMPLANT

## 2014-04-30 NOTE — Anesthesia Preprocedure Evaluation (Addendum)
Anesthesia Evaluation  Patient identified by MRN, date of birth, ID band Patient awake    Reviewed: Allergy & Precautions, H&P , NPO status , Patient's Chart, lab work & pertinent test results  History of Anesthesia Complications (+) PONV and history of anesthetic complications  Airway Mallampati: II TM Distance: >3 FB Neck ROM: Full    Dental no notable dental hx.    Pulmonary asthma , pneumonia -, resolved, former smoker,  breath sounds clear to auscultation  Pulmonary exam normal       Cardiovascular Exercise Tolerance: Good hypertension, Pt. 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" CVA, No Residual Symptoms    GI/Hepatic Neg liver ROS, GERD-  Medicated,  Endo/Other  negative endocrine ROS  Renal/GU negative Renal ROS  negative genitourinary   Musculoskeletal negative musculoskeletal ROS (+)   Abdominal (+) + obese,   Peds negative pediatric ROS (+)  Hematology  (+) anemia ,   Anesthesia Other Findings   Reproductive/Obstetrics negative OB ROS                          Anesthesia Physical Anesthesia Plan  ASA: III  Anesthesia Plan: Spinal   Post-op Pain Management:    Induction: Intravenous  Airway Management Planned:   Additional Equipment:   Intra-op Plan:   Post-operative Plan:   Informed Consent: I have reviewed the patients History and Physical, chart, labs and discussed the procedure including the risks, benefits and alternatives for the proposed anesthesia with the patient or authorized representative who has indicated his/her understanding and acceptance.   Dental advisory given  Plan Discussed with: CRNA  Anesthesia Plan Comments: (Discussed risks/benefits of spinal including headache, backache, failure, bleeding, infection, and nerve damage. Patient consents to  spinal. Questions answered. Coagulation studies and platelet count acceptable.)       Anesthesia Quick Evaluation

## 2014-04-30 NOTE — Progress Notes (Signed)
Clinical Social Work Department BRIEF PSYCHOSOCIAL ASSESSMENT 04/30/2014  Patient:  Marie Harrison, Marie Harrison     Account Number:  [id number]     Admit date:  04/30/2014  Clinical Social Worker:  Lacie Scotts  Date/Time:  04/30/2014 04:09 PM  Referred by:  Physician  Date Referred:  04/30/2014 Referred for  SNF Placement   Other Referral:   Interview type:  Patient Other interview type:    PSYCHOSOCIAL DATA Living Status:  HUSBAND Admitted from facility:   Level of care:   Primary support name:  [facility] Primary support relationship to patient:  SPOUSE Degree of support available:   supportive    CURRENT CONCERNS Current Concerns  Post-Acute Placement   Other Concerns:    SOCIAL WORK ASSESSMENT / PLAN Pt is a 73 yr old female living at home prior to hospitalization. CSW met with pt / spouse to assist with d/c planning. This is a planned admission. Pt would like to have ST Rehab at [facility] following hospital d/c. CSW has contacted SNF and is waiting for confirmation of d/c plan. CSW will continue to follow to assist with d/c planning to SNF.   Assessment/plan status:  Psychosocial Support/Ongoing Assessment of Needs Other assessment/ plan:   Information/referral to community resources:   Insurance coverage for SNF and ambulance transport reviewed.    PATIENT'S/FAMILY'S RESPONSE TO PLAN OF CARE: Pt's mood is bright. She is happy surgery is over. Pt is motivated to begin therapy and is hopeful that [facility] will accept her for rehab.   Werner Lean LCSW [phone]

## 2014-04-30 NOTE — Op Note (Signed)
Pre-operative diagnosis- Osteoarthritis  Right knee(s)  Post-operative diagnosis- Osteoarthritis Right knee(s)  Procedure-  Right  Total Knee Arthroplasty  Surgeon- Dione Plover. Gabi Mcfate, MD  Assistant- Ardeen Jourdain, PA-C   Anesthesia-  Spinal  EBL-* No blood loss amount entered *   Drains Hemovac  Tourniquet time-  Total Tourniquet Time Documented: Thigh (Right) - 29 minutes Total: Thigh (Right) - 29 minutes     Complications- None  Condition-PACU - hemodynamically stable.   Brief Clinical Note  Marie Harrison is a 73 y.o. year old female with end stage OA of her right knee with progressively worsening pain and dysfunction. She has constant pain, with activity and at rest and significant functional deficits with difficulties even with ADLs. She has had extensive non-op management including analgesics, injections of cortisone and viscosupplements, and home exercise program, but remains in significant pain with significant dysfunction.Radiographs show bone on bone arthritis medial and patellofemoral. She presents now for right Total Knee Arthroplasty.    Procedure in detail---   The patient is brought into the operating room and positioned supine on the operating table. After successful administration of  Spinal,   a tourniquet is placed high on the  Right thigh(s) and the lower extremity is prepped and draped in the usual sterile fashion. Time out is performed by the operating team and then the  Right lower extremity is wrapped in Esmarch, knee flexed and the tourniquet inflated to 300 mmHg.       A midline incision is made with a ten blade through the subcutaneous tissue to the level of the extensor mechanism. A fresh blade is used to make a medial parapatellar arthrotomy. Soft tissue over the proximal medial tibia is subperiosteally elevated to the joint line with a knife and into the semimembranosus bursa with a Cobb elevator. Soft tissue over the proximal lateral tibia is elevated  with attention being paid to avoiding the patellar tendon on the tibial tubercle. The patella is everted, knee flexed 90 degrees and the ACL and PCL are removed. Findings are bone on bone medial and patellofemoral with large medial osteophytes.      The drill is used to create a starting hole in the distal femur and the canal is thoroughly irrigated with sterile saline to remove the fatty contents. The 5 degree Right  valgus alignment guide is placed into the femoral canal and the distal femoral cutting block is pinned to remove 10 mm off the distal femur. Resection is made with an oscillating saw.      The tibia is subluxed forward and the menisci are removed. The extramedullary alignment guide is placed referencing proximally at the medial aspect of the tibial tubercle and distally along the second metatarsal axis and tibial crest. The block is pinned to remove 12mm off the more deficient medial  side. Resection is made with an oscillating saw. Size 2.5is the most appropriate size for the tibia and the proximal tibia is prepared with the modular drill and keel punch for that size.      The femoral sizing guide is placed and size 2.5 is most appropriate. Rotation is marked off the epicondylar axis and confirmed by creating a rectangular flexion gap at 90 degrees. The size 2.5 cutting block is pinned in this rotation and the anterior, posterior and chamfer cuts are made with the oscillating saw. The intercondylar block is then placed and that cut is made.      Trial size 2.5 tibial component, trial size 2.5 posterior  stabilized femur and a 10  mm posterior stabilized rotating platform insert trial is placed. Full extension is achieved with excellent varus/valgus and anterior/posterior balance throughout full range of motion. The patella is everted and thickness measured to be 22  mm. Free hand resection is taken to 12 mm, a 35 template is placed, lug holes are drilled, trial patella is placed, and it tracks  normally. Osteophytes are removed off the posterior femur with the trial in place. All trials are removed and the cut bone surfaces prepared with pulsatile lavage. Cement is mixed and once ready for implantation, the size 2.5 tibial implant, size  2.5 posterior stabilized femoral component, and the size 35 patella are cemented in place and the patella is held with the clamp. The trial insert is placed and the knee held in full extension. The Exparel (20 ml mixed with 30 ml saline) and .25% Bupivicaine, are injected into the extensor mechanism, posterior capsule, medial and lateral gutters and subcutaneous tissues.  All extruded cement is removed and once the cement is hard the permanent 10 mm posterior stabilized rotating platform insert is placed into the tibial tray.      The wound is copiously irrigated with saline solution and the extensor mechanism closed over a hemovac drain with #1 V-loc suture. The tourniquet is released for a total tourniquet time of 29  minutes. Flexion against gravity is 140 degrees and the patella tracks normally. Subcutaneous tissue is closed with 2.0 vicryl and subcuticular with running 4.0 Monocryl. The incision is cleaned and dried and steri-strips and a bulky sterile dressing are applied. The limb is placed into a knee immobilizer and the patient is awakened and transported to recovery in stable condition.      Please note that a surgical assistant was a medical necessity for this procedure in order to perform it in a safe and expeditious manner. Surgical assistant was necessary to retract the ligaments and vital neurovascular structures to prevent injury to them and also necessary for proper positioning of the limb to allow for anatomic placement of the prosthesis.   Dione Plover Ronan Dion, MD    04/30/2014, 10:24 AM

## 2014-04-30 NOTE — Transfer of Care (Signed)
Immediate Anesthesia Transfer of Care Note  Patient: Marie Harrison  Procedure(s) Performed: Procedure(s): RIGHT TOTAL KNEE ARTHROPLASTY (Right)  Patient Location: PACU  Anesthesia Type:Regional and Spinal  Level of Consciousness: awake, oriented and patient cooperative  Airway & Oxygen Therapy: Patient Spontanous Breathing and Patient connected to face mask oxygen  Post-op Assessment: Report given to PACU RN and Post -op Vital signs reviewed and stable  Post vital signs: Reviewed and stable  Complications: No apparent anesthesia complications

## 2014-04-30 NOTE — H&P (View-Only) (Signed)
Marie Harrison DOB: 11-29-1940 Married / Language: English / Race: White Female Date of Admission:  04-30-2014 Chief Complaint: Right Knee Pain History of Present Illness The patient is a 73 year old female who comes in for a preoperative History and Physical. The patient is scheduled for a right total knee arthroplasty to be performed by Dr. Dione Plover. Aluisio, MD at [facility] on 04/30/2014. The patient is being followed for their bilateral knee pain. She is now several month(s) out from Visco injections. The injections did not provide significant releif. Symptoms reported today include: pain, swelling (slightly), aching and stiffness. and report their pain level to be 6 / 10. Current treatment includes: Prednisone for eye issues which has helped the knee as well. The patient has reported improvement of their symptoms with: viscosupplementation which only helped for about 2 months. The patient feels that they are doing poorly. The following medication has been used for pain control: antiinflammatory medication (Ibuprofen). The patient had questions regarding surgery. Patient states that she has noticed some improvement, but still painful. The right is more painful in comparison to the left. She said that she stood in one place too long a few weeks ago and had a terrible pain in her right leg. She said that it lasted a few days and then went away. She is now ready to proceed witht he knee repalcement at this time.  She has been doing home exercises in preparation for the surgery. They have been treated conservatively in the past for the above stated problem and despite conservative measures, they continue to have progressive pain and severe functional limitations and dysfunction. They have failed non-operative management including home exercise, medications, and injections. It is felt that they would benefit from undergoing total joint replacement. Risks and benefits of the procedure have been  discussed with the patient and they elect to proceed with surgery. There are no active contraindications to surgery such as ongoing infection or rapidly progressive neurological disease.  Allergies Codeine Derivatives Itching, Nausea.  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 mother Depression grandmother mothers side Hypertension father and grandmother mothers side Congestive Heart Failure Father. father Osteoporosis mother Heart Disease father and grandmother mothers side  Social History Alcohol use current drinker; drinks beer and wine; only occasionally per week Drug/Alcohol Rehab (Previously) no Exercise Exercises rarely; does other Tobacco use Former smoker. former smoker; smoke(d) less than 1/2 pack(s) per day Illicit drug use no Pain Contract no Tobacco / smoke exposure no Living situation live with spouse Children 2 Current work status retired Engineer, agricultural (Currently) no Marital status  married Number of flights of stairs before winded 1 Post-Surgical Plans Home  Medication History  Claritin (Oral) Specific dose unknown - Active. FLUoxetine HCl (20MG  Capsule, Oral) Active. Hydrochlorothiazide (25MG  Tablet, Oral) Active. Lovastatin (  40MG  Tablet, Oral) Active. Potassium Chloride CR (10MEQ Capsule ER, Oral) Active. Aspirin (81MG  Tablet, 1 Oral) Active. Omeprazole (40MG  Capsule DR, Oral) Active. (prn) Xanax (0.5MG  Tablet, Oral) Active. (prn) Lisinopril (Oral) Specific dose unknown - Active. (80 mg)  Past Surgical History Dilation and Curettage of Uterus Foot Surgery right Tonsillectomy Arthroscopy of Knee Left Arthroscopic Knee Surgery - Right Cataract Surgery Bilateral  Review of Systems  General Not Present- Chills, Fatigue, Fever, Memory Loss, Night Sweats, Weight Gain and Weight Loss. Skin Not Present- Eczema, Hives, Itching, Lesions and Rash. HEENT Present- Tinnitus. Not Present- Dentures, Double Vision, Headache, Hearing Loss and Visual Loss. Respiratory Not Present- Allergies, Chronic Cough, Coughing up blood, Shortness of breath at rest and Shortness of breath with exertion. Cardiovascular Present- Swelling. Not Present- Chest Pain, Difficulty Breathing Lying Down, Murmur, Palpitations and Racing/skipping heartbeats. Gastrointestinal Present- Constipation and Diarrhea. Not Present- Abdominal Pain, Bloody Stool, Difficulty Swallowing, Heartburn, Jaundice, Loss of appetitie, Nausea and Vomiting. Female Genitourinary Present- Incontinence (mild). Not Present- Blood in Urine, Discharge, Flank Pain, Painful Urination, Urgency, Urinary frequency, Urinary Retention, Urinating at Night and Weak urinary stream. Musculoskeletal Present- Joint Swelling, Morning Stiffness and Muscle Pain. Not Present- Back Pain, Joint Pain, Muscle Weakness and Spasms. Neurological Not Present- Blackout spells, Difficulty with balance, Dizziness, Paralysis, Tremor and  Weakness. Psychiatric Not Present- Insomnia.   Vitals  Height: 61in Height was reported by patient. BP: 136/78 (Sitting, Right Arm, Standard)    Physical Exam  General Mental Status -Alert, cooperative and good historian. General Appearance-pleasant, Not in acute distress. Orientation-Oriented X3. Build & Nutrition-Well nourished and Well developed.  Head and Neck Head-normocephalic, atraumatic . Neck Global Assessment - supple, no bruit auscultated on the right, no bruit auscultated on the left. Note: upper and lower partial dentures   Eye Pupil - Bilateral-Regular and Round. Motion - Bilateral-EOMI.  Chest and Lung Exam Auscultation Breath sounds - clear at anterior chest wall and clear at posterior chest wall. Adventitious sounds - No Adventitious sounds.  Cardiovascular Auscultation Rhythm - Regular rate and rhythm. Heart Sounds - S1 WNL and S2 WNL. Murmurs & Other Heart Sounds - Auscultation of the heart reveals - No Murmurs.  Abdomen Palpation/Percussion Tenderness - Abdomen is non-tender to palpation. Rigidity (guarding) - Abdomen is soft. Auscultation Auscultation of the abdomen reveals - Bowel sounds normal.  Female Genitourinary Note: Not done, not pertinent to present illness   Musculoskeletal Note: Right Lower Extremity: Right Hip: Strength and Tone - Hip Flexors - 4+/5. Abductors - 4+/5. Adductors - 5/5. Hip Extensors - 4+/5. Right Knee: Inspection and Palpation - Tenderness - anterior knee tender to palpation. Strength and Tone - Quadriceps - 4+/5. Hamstrings - 4+/5. ROM: Flexion - AROM - 121 . PROM - 125 . Extension - AROM - 3 . PROM - 0 . Right Knee - Stability - No instability about the knee. Gait and Station - Abnormal Gait Patterns - antalgic gait(mild). Assistive Devices - no assistive devices.   Assessment & Plan  Osteoarthritis of right knee (715.96  M17.9) Note:Plan is for a Right Total Knee Replacement by Dr.  Wynelle Link.  Plan is to go home  PCP - Dr. Sherren Mocha - Patient has been seen preoperatively and felt to be stable for surgery.  The patient will receive topical TXA (tranexamic acid) due to: CVA  Signed electronically by Joelene Millin, III PA-C

## 2014-04-30 NOTE — Evaluation (Signed)
Physical Therapy Evaluation Patient Details Name: Marie Harrison MRN: [id number] DOB: 04-05-1941 Today's Date: 04/30/2014   History of Present Illness  R TKA  Clinical Impression  Pt  Tolerated  Standing and 5'. Pt plans for SNF rehab. Pt will beneift from PT to address problems listed in  Note below.    Follow Up Recommendations SNF;Supervision/Assistance - 24 hour    Equipment Recommendations  None recommended by PT    Recommendations for Other Services       Precautions / Restrictions Precautions Precautions: Knee Required Braces or Orthoses: Knee Immobilizer - Right Restrictions Weight Bearing Restrictions: No      Mobility  Bed Mobility Overal bed mobility: Needs Assistance Bed Mobility: Supine to Sit     Supine to sit: Min assist;HOB elevated     General bed mobility comments: cues for technique  Transfers Overall transfer level: Needs assistance Equipment used: Rolling walker (2 wheeled) Transfers: Sit to/from Stand Sit to Stand: Min assist;From elevated surface         General transfer comment: cues for hand and R leg placement  Ambulation/Gait Ambulation/Gait assistance: Min assist Ambulation Distance (Feet): 5 Feet Assistive device: Rolling walker (2 wheeled)       General Gait Details: felt some nausea, returned to bed  Stairs            Wheelchair Mobility    Modified Rankin (Stroke Patients Only)       Balance                                             Pertinent Vitals/Pain Pain Assessment: 0-10 Pain Score: 5  Pain Descriptors / Indicators: Aching Pain Intervention(s): Monitored during session;Premedicated before session;Ice applied    Home Living Family/patient expects to be discharged to:: Skilled nursing facility Living Arrangements: Spouse/significant other               Additional Comments: spouse has medical issues, pt needs to be independent    Prior Function                  Hand Dominance        Extremity/Trunk Assessment   Upper Extremity Assessment: Overall WFL for tasks assessed           Lower Extremity Assessment: RLE deficits/detail RLE Deficits / Details: needs assist for SLR       Communication      Cognition Arousal/Alertness: Awake/alert Behavior During Therapy: WFL for tasks assessed/performed Overall Cognitive Status: Within Functional Limits for tasks assessed                      General Comments      Exercises        Assessment/Plan    PT Assessment Patient needs continued PT services  PT Diagnosis Difficulty walking;Acute pain   PT Problem List Decreased strength;Decreased range of motion;Decreased activity tolerance;Decreased mobility;Decreased knowledge of precautions;Decreased knowledge of use of DME;Decreased safety awareness;Pain  PT Treatment Interventions DME instruction;Gait training;Functional mobility training;Therapeutic activities;Therapeutic exercise;Patient/family education   PT Goals (Current goals can be found in the Care Plan section) Acute Rehab PT Goals Patient Stated Goal: To walk without pain PT Goal Formulation: With patient Time For Goal Achievement: 05/07/14 Potential to Achieve Goals: Good    Frequency 7X/week   Barriers to discharge Decreased caregiver support  Co-evaluation               End of Session Equipment Utilized During Treatment: Right knee immobilizer Activity Tolerance: Treatment limited secondary to medical complications (Comment) Patient left: in bed;with call bell/phone within reach;with family/visitor present Nurse Communication: Mobility status         Time: 8206-0156 PT Time Calculation (min): 34 min   Charges:   PT Evaluation $Initial PT Evaluation Tier I: 1 Procedure PT Treatments $Gait Training: 23-37 mins   PT G Codes:          Claretha Cooper 04/30/2014, 5:29 PM Tresa Endo PT [phone]

## 2014-04-30 NOTE — Interval H&P Note (Signed)
History and Physical Interval Note:  04/30/2014 6:55 AM  Marie Harrison  has presented today for surgery, with the diagnosis of OSTEOARTHRITIS RIGHT KNEE  The various methods of treatment have been discussed with the patient and family. After consideration of risks, benefits and other options for treatment, the patient has consented to  Procedure(s): RIGHT TOTAL KNEE ARTHROPLASTY (Right) as a surgical intervention .  The patient's history has been reviewed, patient examined, no change in status, stable for surgery.  I have reviewed the patient's chart and labs.  Questions were answered to the patient's satisfaction.     Gearlean Alf

## 2014-04-30 NOTE — Anesthesia Postprocedure Evaluation (Signed)
  Anesthesia Post-op Note  Patient: Marie Harrison  Procedure(s) Performed: Procedure(s) (LRB): RIGHT TOTAL KNEE ARTHROPLASTY (Right)  Patient Location: PACU  Anesthesia Type: Spinal  Level of Consciousness: awake and alert   Airway and Oxygen Therapy: Patient Spontanous Breathing  Post-op Pain: mild  Post-op Assessment: Post-op Vital signs reviewed, Patient's Cardiovascular Status Stable, Respiratory Function Stable, Patent Airway and No signs of Nausea or vomiting  Last Vitals:  Filed Vitals:   04/30/14 1315  BP: 152/71  Pulse: 63  Temp: 36.3 C  Resp: 17    Post-op Vital Signs: stable   Complications: No apparent anesthesia complications

## 2014-04-30 NOTE — Anesthesia Procedure Notes (Signed)
Spinal  Patient location during procedure: OR Staffing Anesthesiologist: Salley Scarlet Performed by: anesthesiologist  Preanesthetic Checklist Completed: patient identified, site marked, surgical consent, pre-op evaluation, timeout performed, IV checked, risks and benefits discussed and monitors and equipment checked Spinal Block Patient position: sitting Prep: Betadine Patient monitoring: heart rate, continuous pulse ox and blood pressure Approach: midline Location: L3-4 Injection technique: single-shot Needle Needle type: Spinocan  Needle gauge: 22 G Needle length: 9 cm Additional Notes Expiration date of kit checked and confirmed. Patient tolerated procedure well, without complications. No paresthesia. CSF clear.

## 2014-05-01 ENCOUNTER — Encounter (HOSPITAL_COMMUNITY): Payer: Self-pay | Admitting: Orthopedic Surgery

## 2014-05-01 DIAGNOSIS — E876 Hypokalemia: Secondary | ICD-10-CM | POA: Diagnosis not present

## 2014-05-01 DIAGNOSIS — E871 Hypo-osmolality and hyponatremia: Secondary | ICD-10-CM | POA: Diagnosis not present

## 2014-05-01 LAB — BASIC METABOLIC PANEL
ANION GAP: 12 (ref 5–15)
BUN: 7 mg/dL (ref 6–23)
CHLORIDE: 93 meq/L — AB (ref 96–112)
CO2: 25 mEq/L (ref 19–32)
Calcium: 8.4 mg/dL (ref 8.4–10.5)
Creatinine, Ser: 0.47 mg/dL — ABNORMAL LOW (ref 0.50–1.10)
GFR calc non Af Amer: >90 mL/min (ref >90)
Glucose, Bld: 153 mg/dL — ABNORMAL HIGH (ref 70–99)
Potassium: 3.2 mEq/L — ABNORMAL LOW (ref 3.7–5.3)
Sodium: 130 mEq/L — ABNORMAL LOW (ref 137–147)

## 2014-05-01 LAB — CBC
HEMATOCRIT: 30.3 % — AB (ref 36.0–46.0)
Hemoglobin: 10.2 g/dL — ABNORMAL LOW (ref 12.0–15.0)
MCH: 31.8 pg (ref 26.0–34.0)
MCHC: 33.7 g/dL (ref 30.0–36.0)
MCV: 94.4 fL (ref 78.0–100.0)
PLATELETS: 228 10*3/uL (ref 150–400)
RBC: 3.21 MIL/uL — ABNORMAL LOW (ref 3.87–5.11)
RDW: 14.5 % (ref 11.5–15.5)
WBC: 14.8 10*3/uL — AB (ref 4.0–10.5)

## 2014-05-01 MED ORDER — POTASSIUM CHLORIDE CRYS ER 10 MEQ PO TBCR
10.0000 meq | EXTENDED_RELEASE_TABLET | Freq: Two times a day (BID) | ORAL | Status: DC
  Administered 2014-05-02 – 2014-05-03 (×3): 10 meq via ORAL
  Filled 2014-05-01 (×4): qty 1

## 2014-05-01 MED ORDER — POTASSIUM CHLORIDE CRYS ER 20 MEQ PO TBCR
40.0000 meq | EXTENDED_RELEASE_TABLET | Freq: Two times a day (BID) | ORAL | Status: AC
  Administered 2014-05-01: 40 meq via ORAL
  Administered 2014-05-01: 20 meq via ORAL
  Filled 2014-05-01 (×2): qty 2

## 2014-05-01 MED ORDER — NON FORMULARY: 40.0000 mg | Freq: Every day | Status: DC | PRN

## 2014-05-01 MED ORDER — OMEPRAZOLE 20 MG PO CPDR
40.0000 mg | DELAYED_RELEASE_CAPSULE | Freq: Every day | ORAL | Status: DC | PRN
  Administered 2014-05-01 – 2014-05-03 (×3): 40 mg via ORAL
  Filled 2014-05-01 (×3): qty 2

## 2014-05-01 NOTE — Progress Notes (Signed)
OT Cancellation Note  Patient Details Name: Marie Harrison MRN: [id number] DOB: [birth year]   Cancelled Treatment:    Reason Eval/Treat Not Completed: Other (comment)  Pt is Medicare/Medicaid and current D/C plan is SNF. No apparent immediate acute care OT needs, therefore will defer OT to SNF. If OT eval is needed please call Acute Rehab Dept. at [facility] 05/01/2014, 7:52 AM Lesle Chris, OTR/L [phone] 05/01/2014

## 2014-05-01 NOTE — Progress Notes (Signed)
Physical Therapy Treatment Patient Details Name: Marie Harrison MRN: [id number] DOB: [birth year] Today's Date: 05/01/2014    History of Present Illness  RTKA    PT Comments    Pt c/o feeling nausea again today while moving.     Follow Up Recommendations  SNF;Supervision/Assistance - 24 hour     Equipment Recommendations  None recommended by PT    Recommendations for Other Services       Precautions / Restrictions Precautions Precautions: Knee Required Braces or Orthoses: Knee Immobilizer - Right    Mobility  Bed Mobility Overal bed mobility: Needs Assistance Bed Mobility: Supine to Sit     Supine to sit: Min assist;HOB elevated     General bed mobility comments: cues for technique  Transfers Overall transfer level: Needs assistance Equipment used: Rolling walker (2 wheeled) Transfers: Sit to/from Stand           General transfer comment: cues for hand and R leg placement  Ambulation/Gait Ambulation/Gait assistance: Min assist Ambulation Distance (Feet): 10 Feet   Gait Pattern/deviations: Step-to pattern;Step-through pattern     General Gait Details: still nauseated  once getting up, limited.   Stairs            Wheelchair Mobility    Modified Rankin (Stroke Patients Only)       Balance                                    Cognition Arousal/Alertness: Awake/alert                          Exercises Total Joint Exercises Ankle Circles/Pumps: AROM;Both;10 reps;Supine Quad Sets: Supine;Both;10 reps Heel Slides: AAROM;Right;10 reps;Supine Hip ABduction/ADduction: AROM;Right;10 reps;Supine Straight Leg Raises: AAROM;Right;Supine Goniometric ROM: 10-50    General Comments        Pertinent Vitals/Pain Pain Score: 5  Pain Descriptors / Indicators: Aching Pain Intervention(s): Monitored during session;Repositioned    Home Living                      Prior Function            PT Goals (current  goals can now be found in the care plan section) Progress towards PT goals: Progressing toward goals    Frequency  7X/week    PT Plan Current plan remains appropriate    Co-evaluation             End of Session Equipment Utilized During Treatment: Right knee immobilizer Activity Tolerance: Treatment limited secondary to medical complications (Comment) Patient left: with call bell/phone within reach;with family/visitor present;in chair     Time: 3244-0102 PT Time Calculation (min): 32 min  Charges:  $Gait Training: 8-22 mins $Therapeutic Exercise: 8-22 mins                    G Codes:      Claretha Cooper 05/01/2014, 2:06 PM

## 2014-05-01 NOTE — Progress Notes (Signed)
CARE MANAGEMENT NOTE 05/01/2014  Patient:  CHIOMA, MUKHERJEE   Account Number:  [id number]  Date Initiated:  05/01/2014  Documentation initiated by:  Luismario Coston  Subjective/Objective Assessment:   right total knee arthoplasty     Action/Plan:   if snf if needed prefers [place]/or home with famiy and [place]   Anticipated DC Date:  05/04/2014   Anticipated DC Plan:  [place]  In-house referral  Clinical Social Worker      DC Planning Services  CM consult      [facility] Choice  NA   Choice offered to / List presented to:  C-1 Patient      DME agency  [facility]   Status of service:  In process, will continue to follow Medicare Important Message given?  NA - LOS <3 / Initial given by admissions (If response is "NO", the following Medicare IM given date fields will be blank) Date Medicare IM given:   Medicare IM given by:   Date Additional Medicare IM given:   Additional Medicare IM given by:    Discharge Disposition:    Per UR Regulation:  Reviewed for med. necessity/level of care/duration of stay  If discussed at [place] of Stay Meetings, dates discussed:    Comments:  Suanne Marker Freida Nebel,RN,BSN,CCM

## 2014-05-01 NOTE — Progress Notes (Signed)
Subjective: 1 Day Post-Op Procedure(s) (LRB): RIGHT TOTAL KNEE ARTHROPLASTY (Right) Patient reports pain as mild.   Patient seen in rounds with Dr. Wynelle Link.  Tough night, did not get much sleep. Patient is well, and has had no acute complaints or problems We will start therapy today.  Plan is to go home versus rehab after hospital stay.  Objective: Vital signs in last 24 hours: Temp:  [97.4 F (36.3 C)-98.6 F (37 C)] 98.6 F (37 C) (08/25 0523) Pulse Rate:  [56-81] 81 (08/25 0523) Resp:  [11-18] 16 (08/25 0800) BP: (119-172)/(55-95) 132/66 mmHg (08/25 0523) SpO2:  [95 %-100 %] 95 % (08/25 0800) Weight:  [71.668 kg (158 lb)] 71.668 kg (158 lb) (08/24 1214)  Intake/Output from previous day:  Intake/Output Summary (Last 24 hours) at 05/01/14 0830 Last data filed at 05/01/14 0700  Gross per 24 hour  Intake 4935.1 ml  Output   3985 ml  Net  950.1 ml    Intake/Output this shift: UOP 900 since MN  Labs:  Recent Labs  05/01/14 0453  HGB 10.2*    Recent Labs  05/01/14 0453  WBC 14.8*  RBC 3.21*  HCT 30.3*  PLT 228    Recent Labs  05/01/14 0453  NA 130*  K 3.2*  CL 93*  CO2 25  BUN 7  CREATININE 0.47*  GLUCOSE 153*  CALCIUM 8.4   No results found for this basename: LABPT, INR,  in the last 72 hours  EXAM General - Patient is Alert, Appropriate and Oriented Extremity - Neurovascular intact Sensation intact distally Dorsiflexion/Plantar flexion intact Dressing - dressing C/D/I Motor Function - intact, moving foot and toes well on exam.  Hemovac pulled without difficulty.  Past Medical History  Diagnosis Date  . Depression   . GERD (gastroesophageal reflux disease)   . Hyperlipidemia   . Hypertension   . Urinary incontinence   . Arthritis   . Chicken pox   . Fainting spell     SEVERAL YRS AGO--PT STATES HER POTASSIUM WAS LOW AT THE TIME--NO PROBLEMS SINCE AND TAKES DAILY POTASSIUM  . Ulcers of yaws   . Hx: UTI (urinary tract infection)    NO PROBLEMS IN LAST 2 YRS  . Arrhythmia     EPISODE OF IRREG HB-WENT TO ER--AND RELEASED - STATES NO PROBLEMS FOUND  . Pain     RIGHT KNEE--MENISCAL TEAR; STATES SOME DISCOMFORT IN LEFT KNEE  . PONV (postoperative nausea and vomiting)   . Left foot pain     FALLEN INSTEP-WEARS ORTHOTIC IN HER SHOE AND HAS DISCOMFORT IF SHE DOES A LOT OF WALKING  . Basal cell carcinoma of face 05/08/13    X6 face and head  . Cerebrovascular accident 2009    EVIDENCE OF STROKE PER CT OF HEAD DONE AS FOLLOW UP OF IRREGULAR HB--PT DOES NOT KNOW OF ANY SPECIFIC INCIDENT OF HAVING STROKE  . Pneumonia     hx of   . Anxiety   . Anemia     hx of   . Torus palatinus     Assessment/Plan: 1 Day Post-Op Procedure(s) (LRB): RIGHT TOTAL KNEE ARTHROPLASTY (Right) Principal Problem:   OA (osteoarthritis) of knee Active Problems:   HYPERLIPIDEMIA   DEPRESSION   HYPERTENSION   GERD   CEREBROVASCULAR ACCIDENT, HX OF   Hyponatremia   Hypokalemia  Estimated body mass index is 29.87 kg/(m^2) as calculated from the following:   Height as of this encounter: 5\' 1"  (1.549 m).   Weight as  of this encounter: 71.668 kg (158 lb). Advance diet Up with therapy Discharge to SNF  Give additional potassium today and then resume her normal K-dur tomorrow  DVT Prophylaxis - Xarelto Weight-Bearing as tolerated to right leg D/C O2 and Pulse OX and try on Room Air  Arlee Muslim, PA-C Orthopaedic Surgery 05/01/2014, 8:30 AM

## 2014-05-01 NOTE — Discharge Instructions (Addendum)
° °Dr. Frank Aluisio °Total Joint Specialist °[facility] °[address] °[place], [place] [address] °[phone] ° °TOTAL KNEE REPLACEMENT POSTOPERATIVE DIRECTIONS ° ° ° °Knee Rehabilitation, Guidelines Following Surgery  °Results after knee surgery are often greatly improved when you follow the exercise, range of motion and muscle strengthening exercises prescribed by your doctor. Safety measures are also important to protect the knee from further injury. Any time any of these exercises cause you to have increased pain or swelling in your knee joint, decrease the amount until you are comfortable again and slowly increase them. If you have problems or questions, call your caregiver or physical therapist for advice.  ° °HOME CARE INSTRUCTIONS  °Remove items at home which could result in a fall. This includes throw rugs or furniture in walking pathways.  °Continue medications as instructed at time of discharge. °You may have some home medications which will be placed on hold until you complete the course of blood thinner medication.  °You may start showering once you are discharged home but do not submerge the incision under water. Just pat the incision dry and apply a dry gauze dressing on daily. °Walk with walker as instructed.  °You may resume a sexual relationship in one month or when given the OK by  your doctor.  °· Use walker as long as suggested by your caregivers. °· Avoid periods of inactivity such as sitting longer than an hour when not asleep. This helps prevent blood clots.  °You may put full weight on your legs and walk as much as is comfortable.  °You may return to work once you are cleared by your doctor.  °Do not drive a car for 6 weeks or until released by you surgeon.  °· Do not drive while taking narcotics.  °Wear the elastic stockings for three weeks following surgery during the day but you may remove then at night. °Make sure you keep all of your appointments after your  operation with all of your doctors and caregivers. You should call the office at the above phone number and make an appointment for approximately two weeks after the date of your surgery. °Change the dressing daily and reapply a dry dressing each time. °Please pick up a stool softener and laxative for home use as long as you are requiring pain medications. °· Continue to use ice on the knee for pain and swelling from surgery. You may notice swelling that will progress down to the foot and ankle.  This is normal after surgery.  Elevate the leg when you are not up walking on it.   °It is important for you to complete the blood thinner medication as prescribed by your doctor. °· Continue to use the breathing machine which will help keep your temperature down.  It is common for your temperature to cycle up and down following surgery, especially at night when you are not up moving around and exerting yourself.  The breathing machine keeps your lungs expanded and your temperature down. ° °RANGE OF MOTION AND STRENGTHENING EXERCISES  °Rehabilitation of the knee is important following a knee injury or an operation. After just a few days of immobilization, the muscles of the thigh which control the knee become weakened and shrink (atrophy). Knee exercises are designed to build up the tone and strength of the thigh muscles and to improve knee motion. Often times heat used for twenty to thirty minutes before working out will loosen up your tissues and help with improving the   range of motion but do not use heat for the first two weeks following surgery. These exercises can be done on a training (exercise) mat, on the floor, on a table or on a bed. Use what ever works the best and is most comfortable for you Knee exercises include:  °Leg Lifts - While your knee is still immobilized in a splint or cast, you can do straight leg raises. Lift the leg to 60 degrees, hold for 3 sec, and slowly lower the leg. Repeat 10-20 times 2-3  times daily. Perform this exercise against resistance later as your knee gets better.  °Quad and Hamstring Sets - Tighten up the muscle on the front of the thigh (Quad) and hold for 5-10 sec. Repeat this 10-20 times hourly. Hamstring sets are done by pushing the foot backward against an object and holding for 5-10 sec. Repeat as with quad sets.  °A rehabilitation program following serious knee injuries can speed recovery and prevent re-injury in the future due to weakened muscles. Contact your doctor or a physical therapist for more information on knee rehabilitation.  ° °SKILLED REHAB INSTRUCTIONS: °If the patient is transferred to a skilled rehab facility following release from the hospital, a list of the current medications will be sent to the facility for the patient to continue.  When discharged from the skilled rehab facility, please have the facility set up the patient's Home Health Physical Therapy prior to being released. Also, the skilled facility will be responsible for providing the patient with their medications at time of release from the facility to include their pain medication, the muscle relaxants, and their blood thinner medication. If the patient is still at the rehab facility at time of the two week follow up appointment, the skilled rehab facility will also need to assist the patient in arranging follow up appointment in our office and any transportation needs. ° °MAKE SURE YOU:  °Understand these instructions.  °Will watch your condition.  °Will get help right away if you are not doing well or get worse.  ° ° °Pick up stool softner and laxative for home. °Do not submerge incision under water. °May shower. °Continue to use ice for pain and swelling from surgery. ° ° °Take Xarelto for two and a half more weeks, then discontinue Xarelto. °Once the patient has completed the Xarelto, they may resume the 81 mg Aspirin. ° °When discharged from the skilled rehab facility, please have the facility set  up the patient's Home Health Physical Therapy prior to being released.  Also provide the patient with their medications at time of release from the facility to include their pain medication, the muscle relaxants, and their blood thinner medication.  If the patient is still at the rehab facility at time of follow up appointment, please also assist the patient in arranging follow up appointment in our office and any transportation needs. ° ° ° ° °Information on my medicine - XARELTO® (Rivaroxaban) ° °This medication education was reviewed with me or my healthcare representative as part of my discharge preparation.  The pharmacist that spoke with me during my hospital stay was:  Absher, Randall K, RPH ° °Why was Xarelto® prescribed for you? °Xarelto® was prescribed for you to reduce the risk of blood clots forming after orthopedic surgery. The medical term for these abnormal blood clots is venous thromboembolism (VTE). ° °What do you need to know about xarelto® ? °Take your Xarelto® ONCE DAILY at the same time every day. °You may take it   either with or without food. ° °If you have difficulty swallowing the tablet whole, you may crush it and mix in applesauce just prior to taking your dose. ° °Take Xarelto® exactly as prescribed by your doctor and DO NOT stop taking Xarelto® without talking to the doctor who prescribed the medication.  Stopping without other VTE prevention medication to take the place of Xarelto® may increase your risk of developing a clot. ° °After discharge, you should have regular check-up appointments with your healthcare provider that is prescribing your Xarelto®.   ° °What do you do if you miss a dose? °If you miss a dose, take it as soon as you remember on the same day then continue your regularly scheduled once daily regimen the next day. Do not take two doses of Xarelto® on the same day.  ° °Important Safety Information °A possible side effect of Xarelto® is bleeding. You should call your  healthcare provider right away if you experience any of the following: °  Bleeding from an injury or your nose that does not stop. °  Unusual colored urine (red or dark brown) or unusual colored stools (red or black). °  Unusual bruising for unknown reasons. °  A serious fall or if you hit your head (even if there is no bleeding). ° °Some medicines may interact with Xarelto® and might increase your risk of bleeding while on Xarelto®. To help avoid this, consult your healthcare provider or pharmacist prior to using any new prescription or non-prescription medications, including herbals, vitamins, non-steroidal anti-inflammatory drugs (NSAIDs) and supplements. ° °This website has more information on Xarelto®: www.xarelto.com. ° ° °

## 2014-05-02 LAB — CBC
HCT: 30.2 % — ABNORMAL LOW (ref 36.0–46.0)
HEMOGLOBIN: 10 g/dL — AB (ref 12.0–15.0)
MCH: 31.4 pg (ref 26.0–34.0)
MCHC: 33.1 g/dL (ref 30.0–36.0)
MCV: 95 fL (ref 78.0–100.0)
PLATELETS: 245 10*3/uL (ref 150–400)
RBC: 3.18 MIL/uL — ABNORMAL LOW (ref 3.87–5.11)
RDW: 14.6 % (ref 11.5–15.5)
WBC: 14.3 10*3/uL — ABNORMAL HIGH (ref 4.0–10.5)

## 2014-05-02 LAB — BASIC METABOLIC PANEL
ANION GAP: 13 (ref 5–15)
BUN: 8 mg/dL (ref 6–23)
CALCIUM: 9 mg/dL (ref 8.4–10.5)
CO2: 26 mEq/L (ref 19–32)
Chloride: 96 mEq/L (ref 96–112)
Creatinine, Ser: 0.53 mg/dL (ref 0.50–1.10)
Glucose, Bld: 148 mg/dL — ABNORMAL HIGH (ref 70–99)
POTASSIUM: 4 meq/L (ref 3.7–5.3)
SODIUM: 135 meq/L — AB (ref 137–147)

## 2014-05-02 NOTE — Progress Notes (Signed)
Physical Therapy Treatment Patient Details Name: Marie Harrison MRN: [id number] DOB: 1940/09/14 Today's Date: 05/02/2014    History of Present Illness R TKA    PT Comments    POD # 2 assisted pt OOB to amb in hallway then assisted to BR.  Assisted back to bed to perform TE's followed by ICE.    Follow Up Recommendations  SNF [facility])     Equipment Recommendations  None recommended by PT    Recommendations for Other Services       Precautions / Restrictions      Mobility  Bed Mobility Overal bed mobility: Needs Assistance Bed Mobility: Supine to Sit;Sit to Supine     Supine to sit: Min assist Sit to supine: Min assist   General bed mobility comments: Min Assist to support R LE and increased time  Transfers Overall transfer level: Needs assistance Equipment used: Rolling walker (2 wheeled) Transfers: Sit to/from Stand Sit to Stand: Min assist         General transfer comment: cues for hand and R leg placement.  Assisted on/off bed and toilet.  Ambulation/Gait Ambulation/Gait assistance: Min assist Ambulation Distance (Feet): 42 Feet Assistive device: Rolling walker (2 wheeled) Gait Pattern/deviations: Step-to pattern Gait velocity: decreased   General Gait Details: increased time and 25% VC's on proper walker to self distance and safety with turns.  No c/o nausea.   Stairs            Wheelchair Mobility    Modified Rankin (Stroke Patients Only)       Balance                                    Cognition                            Exercises   Total Knee Replacement TE's 10 reps B LE ankle pumps 10 reps towel squeezes 10 reps knee presses 10 reps heel slides  10 reps SAQ's 10 reps SLR's 10 reps ABD Followed by ICE     General Comments        Pertinent Vitals/Pain      Home Living                      Prior Function            PT Goals (current goals can now be found in the care  plan section)      Frequency  7X/week    PT Plan Current plan remains appropriate    Co-evaluation             End of Session Equipment Utilized During Treatment: Gait belt Activity Tolerance: Patient tolerated treatment well Patient left: in bed;with call bell/phone within reach     Time: 1400-1427 PT Time Calculation (min): 27 min  Charges:  $Gait Training: 8-22 mins $Therapeutic Exercise: 8-22 mins                    G Codes:      Rica Koyanagi  PTA [facility]  Acute  Rehab Pager      [phone]

## 2014-05-03 LAB — CBC
HCT: 30.6 % — ABNORMAL LOW (ref 36.0–46.0)
Hemoglobin: 10.2 g/dL — ABNORMAL LOW (ref 12.0–15.0)
MCH: 31.8 pg (ref 26.0–34.0)
MCHC: 33.3 g/dL (ref 30.0–36.0)
MCV: 95.3 fL (ref 78.0–100.0)
PLATELETS: 237 10*3/uL (ref 150–400)
RBC: 3.21 MIL/uL — AB (ref 3.87–5.11)
RDW: 14.9 % (ref 11.5–15.5)
WBC: 14.4 10*3/uL — AB (ref 4.0–10.5)

## 2014-05-03 MED ORDER — POLYETHYLENE GLYCOL 3350 17 G PO PACK: 17.0000 g | PACK | Freq: Every day | ORAL | Status: DC | PRN

## 2014-05-03 MED ORDER — BISACODYL 10 MG RE SUPP: 10.0000 mg | Freq: Every day | RECTAL | Status: DC | PRN

## 2014-05-03 MED ORDER — METHOCARBAMOL 500 MG PO TABS: 500.0000 mg | ORAL_TABLET | Freq: Four times a day (QID) | ORAL | Status: DC | PRN

## 2014-05-03 MED ORDER — TRAMADOL HCL 50 MG PO TABS: 50.0000 mg | ORAL_TABLET | Freq: Four times a day (QID) | ORAL | Status: DC | PRN

## 2014-05-03 MED ORDER — HYDROMORPHONE HCL 2 MG PO TABS: 2.0000 mg | ORAL_TABLET | ORAL | Status: DC | PRN

## 2014-05-03 MED ORDER — ONDANSETRON HCL 4 MG PO TABS: 4.0000 mg | ORAL_TABLET | Freq: Four times a day (QID) | ORAL | Status: DC | PRN

## 2014-05-03 MED ORDER — DSS 100 MG PO CAPS: 100.0000 mg | ORAL_CAPSULE | Freq: Two times a day (BID) | ORAL | Status: DC

## 2014-05-03 MED ORDER — METOCLOPRAMIDE HCL 5 MG PO TABS: 5.0000 mg | ORAL_TABLET | Freq: Three times a day (TID) | ORAL | Status: DC | PRN

## 2014-05-03 MED ORDER — RIVAROXABAN 10 MG PO TABS: 10.0000 mg | ORAL_TABLET | Freq: Every day | ORAL | Status: DC

## 2014-05-03 NOTE — Progress Notes (Signed)
Physical Therapy Treatment Patient Details Name: Marie Harrison MRN: [id number] DOB: 06-22-1941 Today's Date: 05/03/2014    History of Present Illness R TKA    PT Comments    POD # 3 pt ready for D/C.  Assisted OOB to amb to BR.  Assisted in BR then amb in hallway.  Assisted back to bed and applied ICE. Pt demon increased grogginess and slow moving (meds?) requiring increased time.  Follow Up Recommendations  SNF [facility])     Equipment Recommendations  None recommended by PT    Recommendations for Other Services       Precautions / Restrictions      Mobility  Bed Mobility Overal bed mobility: Needs Assistance Bed Mobility: Sit to Supine       Sit to supine: Min assist   General bed mobility comments: Min Assist to support R LE and increased time to get back into bed  Transfers Overall transfer level: Needs assistance Equipment used: Rolling walker (2 wheeled) Transfers: Sit to/from Stand Sit to Stand: Min assist         General transfer comment: cues for hand and R leg placement.  Assisted on/off bed and toilet. also required increased time, slow moving today.  Ambulation/Gait Ambulation/Gait assistance: Min assist Ambulation Distance (Feet): 25 Feet Assistive device: Rolling walker (2 wheeled) Gait Pattern/deviations: Step-to pattern Gait velocity: decreased   General Gait Details: increased time and 25% VC's on proper walker to self distance and safety with turns.  Slow moving today, groggy (meds?)   Stairs            Wheelchair Mobility    Modified Rankin (Stroke Patients Only)       Balance                                    Cognition                            Exercises      General Comments        Pertinent Vitals/Pain      Home Living                      Prior Function            PT Goals (current goals can now be found in the care plan section) Progress towards PT goals:  Progressing toward goals    Frequency  7X/week    PT Plan Current plan remains appropriate    Co-evaluation             End of Session Equipment Utilized During Treatment: Gait belt Activity Tolerance: Patient tolerated treatment well Patient left: in bed;with call bell/phone within reach     Time: 1000-1040 PT Time Calculation (min): 40 min  Charges:  $Gait Training: 8-22 mins $Therapeutic Activity: 23-37 mins                    G Codes:      Rica Koyanagi  PTA [facility]  Acute  Rehab Pager      [phone]

## 2014-05-03 NOTE — Progress Notes (Signed)
LATE ENTRY NOTE Date of Service of Visit - 05/02/2014    Subjective: 2 Days Post-Op Procedure(s) (LRB): RIGHT TOTAL KNEE ARTHROPLASTY (Right) Patient reports pain as mild.   Patient seen in rounds for Dr. Wynelle Link. Patient is well, but has had some minor complaints of pain in the knee, requiring pain medications Plan is to go Skilled nursing facility after hospital stay.  Objective: Vital signs in last 24 hours: Temp:  [97.9 F (36.6 C)-98.7 F (37.1 C)] 97.9 F (36.6 C) (08/27 0651) Pulse Rate:  [64-77] 64 (08/27 0651) Resp:  [16] 16 (08/27 0651) BP: (107-160)/(60-72) 107/60 mmHg (08/27 0651) SpO2:  [93 %-95 %] 95 % (08/27 0651)  Intake/Output from previous day:  Intake/Output Summary (Last 24 hours) at 05/03/14 0940 Last data filed at 05/03/14 0653  Gross per 24 hour  Intake    960 ml  Output   1200 ml  Net   -240 ml    Intake/Output this shift:    Labs:  Recent Labs  05/01/14 0453 05/02/14 0455 05/03/14 0530  HGB 10.2* 10.0* 10.2*    Recent Labs  05/02/14 0455 05/03/14 0530  WBC 14.3* 14.4*  RBC 3.18* 3.21*  HCT 30.2* 30.6*  PLT 245 237    Recent Labs  05/01/14 0453 05/02/14 0455  NA 130* 135*  K 3.2* 4.0  CL 93* 96  CO2 25 26  BUN 7 8  CREATININE 0.47* 0.53  GLUCOSE 153* 148*  CALCIUM 8.4 9.0   No results found for this basename: LABPT, INR,  in the last 72 hours  EXAM General - Patient is Alert and Appropriate Extremity - Neurovascular intact Sensation intact distally Dressing/Incision - clean, dry, no drainage Motor Function - intact, moving foot and toes well on exam.   Past Medical History  Diagnosis Date  . Depression   . GERD (gastroesophageal reflux disease)   . Hyperlipidemia   . Hypertension   . Urinary incontinence   . Arthritis   . Chicken pox   . Fainting spell     SEVERAL YRS AGO--PT STATES HER POTASSIUM WAS LOW AT THE TIME--NO PROBLEMS SINCE AND TAKES DAILY POTASSIUM  . Ulcers of yaws   . Hx: UTI (urinary tract  infection)     NO PROBLEMS IN LAST 2 YRS  . Arrhythmia     EPISODE OF IRREG HB-WENT TO ER--AND RELEASED - STATES NO PROBLEMS FOUND  . Pain     RIGHT KNEE--MENISCAL TEAR; STATES SOME DISCOMFORT IN LEFT KNEE  . PONV (postoperative nausea and vomiting)   . Left foot pain     FALLEN INSTEP-WEARS ORTHOTIC IN HER SHOE AND HAS DISCOMFORT IF SHE DOES A LOT OF WALKING  . Basal cell carcinoma of face 05/08/13    X6 face and head  . Cerebrovascular accident 2009    EVIDENCE OF STROKE PER CT OF HEAD DONE AS FOLLOW UP OF IRREGULAR HB--PT DOES NOT KNOW OF ANY SPECIFIC INCIDENT OF HAVING STROKE  . Pneumonia     hx of   . Anxiety   . Anemia     hx of   . Torus palatinus     Assessment/Plan: 2 Days Post-Op Procedure(s) (LRB): RIGHT TOTAL KNEE ARTHROPLASTY (Right) Principal Problem:   OA (osteoarthritis) of knee Active Problems:   HYPERLIPIDEMIA   DEPRESSION   HYPERTENSION   GERD   CEREBROVASCULAR ACCIDENT, HX OF   Hyponatremia   Hypokalemia  Estimated body mass index is 29.87 kg/(m^2) as calculated from the following:   Height  as of this encounter: 5\' 1"  (1.549 m).   Weight as of this encounter: 71.668 kg (158 lb). Up with therapy Plan for discharge tomorrow Discharge to SNF  Sodium and Potassium are both improved today.  DVT Prophylaxis - Xarelto Weight-Bearing as tolerated to right leg  Arlee Muslim, PA-C Orthopaedic Surgery 05/03/2014, 9:40 AM

## 2014-05-03 NOTE — Discharge Summary (Signed)
Physician Discharge Summary   Patient ID: Marie Harrison MRN: [id number] DOB/AGE: 02/19/1941 73 y.o.  Admit date: 04/30/2014 Discharge date: 05/03/2014  Primary Diagnosis:  Osteoarthritis Right knee(s)  Admission Diagnoses:  Past Medical History  Diagnosis Date  . Depression   . GERD (gastroesophageal reflux disease)   . Hyperlipidemia   . Hypertension   . Urinary incontinence   . Arthritis   . Chicken pox   . Fainting spell     SEVERAL YRS AGO--PT STATES HER POTASSIUM WAS LOW AT THE TIME--NO PROBLEMS SINCE AND TAKES DAILY POTASSIUM  . Ulcers of yaws   . Hx: UTI (urinary tract infection)     NO PROBLEMS IN LAST 2 YRS  . Arrhythmia     EPISODE OF IRREG HB-WENT TO ER--AND RELEASED - STATES NO PROBLEMS FOUND  . Pain     RIGHT KNEE--MENISCAL TEAR; STATES SOME DISCOMFORT IN LEFT KNEE  . PONV (postoperative nausea and vomiting)   . Left foot pain     FALLEN INSTEP-WEARS ORTHOTIC IN HER SHOE AND HAS DISCOMFORT IF SHE DOES A LOT OF WALKING  . Basal cell carcinoma of face 05/08/13    X6 face and head  . Cerebrovascular accident 2009    EVIDENCE OF STROKE PER CT OF HEAD DONE AS FOLLOW UP OF IRREGULAR HB--PT DOES NOT KNOW OF ANY SPECIFIC INCIDENT OF HAVING STROKE  . Pneumonia     hx of   . Anxiety   . Anemia     hx of   . Torus palatinus    Discharge Diagnoses:   Principal Problem:   OA (osteoarthritis) of knee Active Problems:   HYPERLIPIDEMIA   DEPRESSION   HYPERTENSION   GERD   CEREBROVASCULAR ACCIDENT, HX OF   Hyponatremia   Hypokalemia  Estimated body mass index is 29.87 kg/(m^2) as calculated from the following:   Height as of this encounter: $RemoveBeforeD'5\' 1"'[id number]$  (1.549 m).   Weight as of this encounter: 71.668 kg (158 lb).  Procedure:  Procedure(s) (LRB): RIGHT TOTAL KNEE ARTHROPLASTY (Right)   Consults: None  HPI: Marie Harrison is a 73 y.o. year old female with end stage OA of her right knee with progressively worsening pain and dysfunction. She has constant pain, with  activity and at rest and significant functional deficits with difficulties even with ADLs. She has had extensive non-op management including analgesics, injections of cortisone and viscosupplements, and home exercise program, but remains in significant pain with significant dysfunction.Radiographs show bone on bone arthritis medial and patellofemoral. She presents now for right Total Knee Arthroplasty.   Laboratory Data: Admission on 04/30/2014  Component Date Value Ref Range Status  . WBC 05/01/2014 14.8* 4.0 - 10.5 K/uL Final  . RBC 05/01/2014 3.21* 3.87 - 5.11 MIL/uL Final  . Hemoglobin 05/01/2014 10.2* 12.0 - 15.0 g/dL Final  . HCT 05/01/2014 30.3* 36.0 - 46.0 % Final  . MCV 05/01/2014 94.4  78.0 - 100.0 fL Final  . MCH 05/01/2014 31.8  26.0 - 34.0 pg Final  . MCHC 05/01/2014 33.7  30.0 - 36.0 g/dL Final  . RDW 05/01/2014 14.5  11.5 - 15.5 % Final  . Platelets 05/01/2014 228  150 - 400 K/uL Final  . Sodium 05/01/2014 130* 137 - 147 mEq/L Final  . Potassium 05/01/2014 3.2* 3.7 - 5.3 mEq/L Final  . Chloride 05/01/2014 93* 96 - 112 mEq/L Final  . CO2 05/01/2014 25  19 - 32 mEq/L Final  . Glucose, Bld 05/01/2014 153* 70 - 99 mg/dL Final  .  BUN 05/01/2014 7  6 - 23 mg/dL Final  . Creatinine, Ser 05/01/2014 0.47* 0.50 - 1.10 mg/dL Final  . Calcium 05/01/2014 8.4  8.4 - 10.5 mg/dL Final  . GFR calc non Af Amer 05/01/2014 >90  >90 mL/min Final  . GFR calc Af Amer 05/01/2014 >90  >90 mL/min Final   Comment: (NOTE)                          The eGFR has been calculated using the CKD EPI equation.                          This calculation has not been validated in all clinical situations.                          eGFR's persistently <90 mL/min signify possible Chronic Kidney                          Disease.  . Anion gap 05/01/2014 12  5 - 15 Final  . WBC 05/02/2014 14.3* 4.0 - 10.5 K/uL Final  . RBC 05/02/2014 3.18* 3.87 - 5.11 MIL/uL Final  . Hemoglobin 05/02/2014 10.0* 12.0 - 15.0 g/dL  Final  . HCT 05/02/2014 30.2* 36.0 - 46.0 % Final  . MCV 05/02/2014 95.0  78.0 - 100.0 fL Final  . MCH 05/02/2014 31.4  26.0 - 34.0 pg Final  . MCHC 05/02/2014 33.1  30.0 - 36.0 g/dL Final  . RDW 05/02/2014 14.6  11.5 - 15.5 % Final  . Platelets 05/02/2014 245  150 - 400 K/uL Final  . Sodium 05/02/2014 135* 137 - 147 mEq/L Final  . Potassium 05/02/2014 4.0  3.7 - 5.3 mEq/L Final   Comment: DELTA CHECK NOTED                          REPEATED TO VERIFY  . Chloride 05/02/2014 96  96 - 112 mEq/L Final  . CO2 05/02/2014 26  19 - 32 mEq/L Final  . Glucose, Bld 05/02/2014 148* 70 - 99 mg/dL Final  . BUN 05/02/2014 8  6 - 23 mg/dL Final  . Creatinine, Ser 05/02/2014 0.53  0.50 - 1.10 mg/dL Final  . Calcium 05/02/2014 9.0  8.4 - 10.5 mg/dL Final  . GFR calc non Af Amer 05/02/2014 >90  >90 mL/min Final  . GFR calc Af Amer 05/02/2014 >90  >90 mL/min Final   Comment: (NOTE)                          The eGFR has been calculated using the CKD EPI equation.                          This calculation has not been validated in all clinical situations.                          eGFR's persistently <90 mL/min signify possible Chronic Kidney                          Disease.  . Anion gap 05/02/2014 13  5 - 15 Final  . WBC 05/03/2014 14.4* 4.0 - 10.5 K/uL Final  . RBC  05/03/2014 3.21* 3.87 - 5.11 MIL/uL Final  . Hemoglobin 05/03/2014 10.2* 12.0 - 15.0 g/dL Final  . HCT 05/03/2014 30.6* 36.0 - 46.0 % Final  . MCV 05/03/2014 95.3  78.0 - 100.0 fL Final  . MCH 05/03/2014 31.8  26.0 - 34.0 pg Final  . MCHC 05/03/2014 33.3  30.0 - 36.0 g/dL Final  . RDW 05/03/2014 14.9  11.5 - 15.5 % Final  . Platelets 05/03/2014 237  150 - 400 K/uL Final  Hospital Outpatient Visit on 04/23/2014  Component Date Value Ref Range Status  . aPTT 04/23/2014 29  24 - 37 seconds Final  . Prothrombin Time 04/23/2014 13.3  11.6 - 15.2 seconds Final  . INR 04/23/2014 1.01  0.00 - 1.49 Final  . ABO/RH(D) 04/23/2014 A POS   Final  .  Antibody Screen 04/23/2014 NEG   Final  . Sample Expiration 04/23/2014 05/03/2014   Final  . Color, Urine 04/23/2014 YELLOW  YELLOW Final  . APPearance 04/23/2014 CLEAR  CLEAR Final  . Specific Gravity, Urine 04/23/2014 1.008  1.005 - 1.030 Final  . pH 04/23/2014 7.0  5.0 - 8.0 Final  . Glucose, UA 04/23/2014 NEGATIVE  NEGATIVE mg/dL Final  . Hgb urine dipstick 04/23/2014 NEGATIVE  NEGATIVE Final  . Bilirubin Urine 04/23/2014 NEGATIVE  NEGATIVE Final  . Ketones, ur 04/23/2014 NEGATIVE  NEGATIVE mg/dL Final  . Protein, ur 04/23/2014 NEGATIVE  NEGATIVE mg/dL Final  . Urobilinogen, UA 04/23/2014 0.2  0.0 - 1.0 mg/dL Final  . Nitrite 04/23/2014 NEGATIVE  NEGATIVE Final  . Leukocytes, UA 04/23/2014 NEGATIVE  NEGATIVE Final   MICROSCOPIC NOT DONE ON URINES WITH NEGATIVE PROTEIN, BLOOD, LEUKOCYTES, NITRITE, OR GLUCOSE <1000 mg/dL.  Marland Kitchen MRSA, PCR 04/23/2014 NEGATIVE  NEGATIVE Final  . Staphylococcus aureus 04/23/2014 NEGATIVE  NEGATIVE Final   Comment:                                 The Xpert SA Assay (FDA                          approved for NASAL specimens                          in patients over 86 years of age),                          is one component of                          a comprehensive surveillance                          program.  Test performance has                          been validated by [employer] for patients greater                          than or equal to 10 year old.  It is not intended                          to diagnose infection nor to                          guide or monitor treatment.  . ABO/RH(D) 04/23/2014 A POS   Final  Lab on 04/10/2014  Component Date Value Ref Range Status  . Cholesterol 04/10/2014 141  0 - 200 mg/dL Final   ATP III Classification       Desirable:  < 200 mg/dL               Borderline High:  200 - 239 mg/dL          High:  > = 240 mg/dL  . Triglycerides 04/10/2014 47.0  0.0 - 149.0  mg/dL Final   Normal:  <150 mg/dLBorderline High:  150 - 199 mg/dL  . HDL 04/10/2014 59.10  >39.00 mg/dL Final  . VLDL 04/10/2014 9.4  0.0 - 40.0 mg/dL Final  . LDL Cholesterol 04/10/2014 73  0 - 99 mg/dL Final  . Total CHOL/HDL Ratio 04/10/2014 2   Final                  Men          Women1/2 Average Risk     3.4          3.3Average Risk          5.0          4.42X Average Risk          9.6          7.13X Average Risk          15.0          11.0                      . NonHDL 04/10/2014 81.90   Final   NOTE:  Non-HDL goal should be 30 mg/dL higher than patient's LDL goal (i.e. LDL goal of < 70 mg/dL, would have non-HDL goal of < 100 mg/dL)  . Total Bilirubin 04/10/2014 0.6  0.2 - 1.2 mg/dL Final  . Bilirubin, Direct 04/10/2014 0.1  0.0 - 0.3 mg/dL Final  . Alkaline Phosphatase 04/10/2014 67  39 - 117 U/L Final  . AST 04/10/2014 19  0 - 37 U/L Final  . ALT 04/10/2014 17  0 - 35 U/L Final  . Total Protein 04/10/2014 6.7  6.0 - 8.3 g/dL Final  . Albumin 04/10/2014 3.6  3.5 - 5.2 g/dL Final  . WBC 04/10/2014 9.2  4.0 - 10.5 K/uL Final  . RBC 04/10/2014 3.81* 3.87 - 5.11 Mil/uL Final  . Hemoglobin 04/10/2014 12.3  12.0 - 15.0 g/dL Final  . HCT 04/10/2014 36.7  36.0 - 46.0 % Final  . MCV 04/10/2014 96.5  78.0 - 100.0 fl Final  . MCHC 04/10/2014 33.5  30.0 - 36.0 g/dL Final  . RDW 04/10/2014 15.6* 11.5 - 15.5 % Final  . Platelets 04/10/2014 286.0  150.0 - 400.0 K/uL Final  . Neutrophils Relative % 04/10/2014 67.8  43.0 - 77.0 % Final  . Lymphocytes Relative 04/10/2014 21.2  12.0 - 46.0 % Final  . Monocytes Relative 04/10/2014 6.9  3.0 - 12.0 % Final  . Eosinophils Relative 04/10/2014 3.7  0.0 - 5.0 % Final  . Basophils Relative  04/10/2014 0.4  0.0 - 3.0 % Final  . Neutro Abs 04/10/2014 6.2  1.4 - 7.7 K/uL Final  . Lymphs Abs 04/10/2014 2.0  0.7 - 4.0 K/uL Final  . Monocytes Absolute 04/10/2014 0.6  0.1 - 1.0 K/uL Final  . Eosinophils Absolute 04/10/2014 0.3  0.0 - 0.7 K/uL Final  .  Basophils Absolute 04/10/2014 0.0  0.0 - 0.1 K/uL Final  . Color, UA 04/10/2014 yellow   Final  . Clarity, UA 04/10/2014 clear   Final  . Glucose, UA 04/10/2014 n   Final  . Bilirubin, UA 04/10/2014 n   Final  . Ketones, UA 04/10/2014 n   Final  . Spec Grav, UA 04/10/2014 1.010   Final  . Blood, UA 04/10/2014 n   Final  . pH, UA 04/10/2014 7.0   Final  . Protein, UA 04/10/2014 n   Final  . Urobilinogen, UA 04/10/2014 0.2   Final  . Nitrite, UA 04/10/2014 n   Final  . Leukocytes, UA 04/10/2014 Negative   Final  . TSH 04/10/2014 1.20  0.35 - 4.50 uIU/mL Final  . Sodium 04/10/2014 138  135 - 145 mEq/L Final  . Potassium 04/10/2014 3.7  3.5 - 5.1 mEq/L Final  . Chloride 04/10/2014 101  96 - 112 mEq/L Final  . CO2 04/10/2014 27  19 - 32 mEq/L Final  . Glucose, Bld 04/10/2014 84  70 - 99 mg/dL Final  . BUN 04/10/2014 11  6 - 23 mg/dL Final  . Creatinine, Ser 04/10/2014 0.6  0.4 - 1.2 mg/dL Final  . Calcium 04/10/2014 9.0  8.4 - 10.5 mg/dL Final  . GFR 04/10/2014 105.60  >60.00 mL/min Final     X-Rays:Dg Chest 2 View  04/23/2014   CLINICAL DATA:  Preop for right knee replacement  EXAM: CHEST  2 VIEW  COMPARISON:  11/30/2012  FINDINGS: Cardiomediastinal silhouette is stable. No acute infiltrate or pleural effusion. No pulmonary edema. Mild degenerative changes thoracic spine.  IMPRESSION: No active cardiopulmonary disease.   Electronically Signed   By: Lahoma Crocker M.D.   On: 04/23/2014 13:26    EKG: Orders placed in visit on 04/17/14  . EKG 12-LEAD     Hospital Course: Marie Harrison is a 73 y.o. who was admitted to [facility]. They were brought to the operating room on 04/30/2014 and underwent Procedure(s): RIGHT TOTAL KNEE ARTHROPLASTY.  Patient tolerated the procedure well and was later transferred to the recovery room and then to the orthopaedic floor for postoperative care.  They were given PO and IV analgesics for pain control following their surgery.  They were given 24  hours of postoperative antibiotics of  Anti-infectives   Start     Dose/Rate Route Frequency Ordered Stop   04/30/14 1500  ceFAZolin (ANCEF) IVPB 1 g/50 mL premix     1 g 100 mL/hr over 30 Minutes Intravenous Every 6 hours 04/30/14 1210 04/30/14 2225   04/30/14 0631  ceFAZolin (ANCEF) IVPB 2 g/50 mL premix     2 g 100 mL/hr over 30 Minutes Intravenous On call to O.R. 04/30/14 1610 04/30/14 0915     and started on DVT prophylaxis in the form of Xarelto.   PT and OT were ordered for total joint protocol.  Discharge planning consulted to help with postop disposition and equipment needs.  Patient had a tough night on the evening of surgery and did not get much sleep.  They started to get up OOB with therapy on day one.  Hemovac drain was pulled without difficulty.  Continued to work with therapy into day two.  Dressing was changed on day two and the incision was healing well.  By day three, the patient had progressed with therapy and meeting their goals.  Incision was healing well.  Patient was seen in rounds by Dr. Lequita Halt and was ready to go to [facility] for continued rehab.  Discharge to SNF  Diet - Cardiac diet  Follow up - next Thursday 05/10/2013  Activity - WBAT  Disposition - Skilled nursing facility  Condition Upon Discharge - Good  D/C Meds - See DC Summary  DVT Prophylaxis - Xarelto       Discharge Instructions   Call MD / Call 911    Complete by:  As directed   If you experience chest pain or shortness of breath, CALL 911 and be transported to the hospital emergency room.  If you develope a fever above 101 F, pus (white drainage) or increased drainage or redness at the wound, or calf pain, call your surgeon's office.     Change dressing    Complete by:  As directed   Change dressing daily with sterile 4 x 4 inch gauze dressing and apply TED hose. Do not submerge the incision under water.     Constipation Prevention    Complete by:  As directed   Drink plenty of fluids.  Prune  juice may be helpful.  You may use a stool softener, such as Colace (over the counter) 100 mg twice a day.  Use MiraLax (over the counter) for constipation as needed.     Diet - low sodium heart healthy    Complete by:  As directed      Discharge instructions    Complete by:  As directed   Pick up stool softner and laxative for home. Do not submerge incision under water. May shower. Continue to use ice for pain and swelling from surgery.  Take Xarelto for two and a half more weeks, then discontinue Xarelto. Once the patient has completed the Xarelto, they may resume the 81 mg Aspirin.  When discharged from the skilled rehab facility, please have the facility set up the patient's Home Health Physical Therapy prior to being released.  Also provide the patient with their medications at time of release from the facility to include their pain medication, the muscle relaxants, and their blood thinner medication.  If the patient is still at the rehab facility at time of follow up appointment, please also assist the patient in arranging follow up appointment in our office and any transportation needs.     Do not put a pillow under the knee. Place it under the heel.    Complete by:  As directed      Do not sit on low chairs, stoools or toilet seats, as it may be difficult to get up from low surfaces    Complete by:  As directed      Driving restrictions    Complete by:  As directed   No driving until released by the physician.     Increase activity slowly as tolerated    Complete by:  As directed      Lifting restrictions    Complete by:  As directed   No lifting until released by the physician.     Patient may shower    Complete by:  As directed   You may shower without a dressing once there is no drainage.  Do not wash over the wound.  If drainage remains, do not shower until drainage stops.     TED hose    Complete by:  As directed   Use stockings (TED hose) for 3 weeks on both leg(s).  You  may remove them at night for sleeping.     Weight bearing as tolerated    Complete by:  As directed             Medication List    STOP taking these medications       aspirin EC 81 MG tablet     SYSTANE OVERNIGHT THERAPY 0.3 % Gel  Generic drug:  Hypromellose      TAKE these medications       acetaminophen 325 MG tablet  Commonly known as:  TYLENOL  Take 325-650 mg by mouth every 6 (six) hours as needed (Pain).     bisacodyl 10 MG suppository  Commonly known as:  DULCOLAX  Place 1 suppository (10 mg total) rectally daily as needed for moderate constipation.     DSS 100 MG Caps  Take 100 mg by mouth 2 (two) times daily.     FLUoxetine 20 MG capsule  Commonly known as:  PROZAC  Take 1 capsule (20 mg total) by mouth every morning.     hydrochlorothiazide 25 MG tablet  Commonly known as:  HYDRODIURIL  Take 1 tablet (25 mg total) by mouth every morning.     HYDROmorphone 2 MG tablet  Commonly known as:  DILAUDID  Take 1-2 tablets (2-4 mg total) by mouth every 4 (four) hours as needed for moderate pain or severe pain.     lisinopril 40 MG tablet  Commonly known as:  PRINIVIL,ZESTRIL  Take 80 mg by mouth every morning.     loratadine 10 MG tablet  Commonly known as:  CLARITIN  Take 10 mg by mouth daily as needed for allergies.     LORazepam 0.5 MG tablet  Commonly known as:  ATIVAN  Take 0.25 mg by mouth at bedtime as needed for sleep.     lovastatin 40 MG tablet  Commonly known as:  MEVACOR  Take 1 tablet (40 mg total) by mouth daily with supper.     methocarbamol 500 MG tablet  Commonly known as:  ROBAXIN  Take 1 tablet (500 mg total) by mouth every 6 (six) hours as needed for muscle spasms.     metoCLOPramide 5 MG tablet  Commonly known as:  REGLAN  Take 1-2 tablets (5-10 mg total) by mouth every 8 (eight) hours as needed for nausea (if ondansetron (ZOFRAN) ineffective.).     omeprazole 40 MG capsule  Commonly known as:  PRILOSEC  Take 40 mg by mouth  daily as needed (Acid reflux).     ondansetron 4 MG tablet  Commonly known as:  ZOFRAN  Take 1 tablet (4 mg total) by mouth every 6 (six) hours as needed for nausea.     polyethylene glycol packet  Commonly known as:  MIRALAX / GLYCOLAX  Take 17 g by mouth daily as needed for mild constipation.     potassium chloride 10 MEQ tablet  Commonly known as:  K-DUR,KLOR-CON  Take 1 tablet (10 mEq total) by mouth 2 (two) times daily.     rivaroxaban 10 MG Tabs tablet  Commonly known as:  XARELTO  - Take 1 tablet (10 mg total) by mouth daily with breakfast. Take Xarelto for two and a half more weeks, then discontinue  Xarelto.  - Once the patient has completed the Xarelto, they may resume the 81 mg Aspirin.     traMADol 50 MG tablet  Commonly known as:  ULTRAM  Take 1-2 tablets (50-100 mg total) by mouth every 6 (six) hours as needed (mild pain).     triamcinolone cream 0.1 %  Commonly known as:  KENALOG  Apply 1 application topically 2 (two) times daily as needed (Itching).       Follow-up Information   Follow up with Gearlean Alf, MD. Schedule an appointment as soon as possible for a visit on 05/10/2014. (Call [phone] tomorrow to make the appointment)    Specialty:  Orthopedic Surgery   Contact information:   [address] [place] [address] [phone]       Signed: Arlee Muslim, PA-C Orthopaedic Surgery 05/03/2014, 9:55 AM

## 2014-05-03 NOTE — Progress Notes (Signed)
Subjective: 3 Days Post-Op Procedure(s) (LRB): RIGHT TOTAL KNEE ARTHROPLASTY (Right) Patient reports pain as mild.   Patient seen in rounds by Dr. Wynelle Link. Patient is well, and has had no acute complaints or problems Patient is ready to go to [facility] for skilled rehab.  Objective: Vital signs in last 24 hours: Temp:  [97.9 F (36.6 C)-98.7 F (37.1 C)] 97.9 F (36.6 C) (08/27 0651) Pulse Rate:  [64-77] 64 (08/27 0651) Resp:  [16] 16 (08/27 0651) BP: (107-160)/(60-72) 107/60 mmHg (08/27 0651) SpO2:  [93 %-95 %] 95 % (08/27 0651)  Intake/Output from previous day:  Intake/Output Summary (Last 24 hours) at 05/03/14 0942 Last data filed at 05/03/14 4315  Gross per 24 hour  Intake    960 ml  Output   1200 ml  Net   -240 ml    Intake/Output this shift:    Labs:  Recent Labs  05/01/14 0453 05/02/14 0455 05/03/14 0530  HGB 10.2* 10.0* 10.2*    Recent Labs  05/02/14 0455 05/03/14 0530  WBC 14.3* 14.4*  RBC 3.18* 3.21*  HCT 30.2* 30.6*  PLT 245 237    Recent Labs  05/01/14 0453 05/02/14 0455  NA 130* 135*  K 3.2* 4.0  CL 93* 96  CO2 25 26  BUN 7 8  CREATININE 0.47* 0.53  GLUCOSE 153* 148*  CALCIUM 8.4 9.0   No results found for this basename: LABPT, INR,  in the last 72 hours  EXAM: General - Patient is Alert, Appropriate and Oriented Extremity - Neurovascular intact Sensation intact distally Incision - clean, dry Motor Function - intact, moving foot and toes well on exam.   Assessment/Plan: 3 Days Post-Op Procedure(s) (LRB): RIGHT TOTAL KNEE ARTHROPLASTY (Right) Procedure(s) (LRB): RIGHT TOTAL KNEE ARTHROPLASTY (Right) Past Medical History  Diagnosis Date  . Depression   . GERD (gastroesophageal reflux disease)   . Hyperlipidemia   . Hypertension   . Urinary incontinence   . Arthritis   . Chicken pox   . Fainting spell     SEVERAL YRS AGO--PT STATES HER POTASSIUM WAS LOW AT THE TIME--NO PROBLEMS SINCE AND TAKES DAILY POTASSIUM  .  Ulcers of yaws   . Hx: UTI (urinary tract infection)     NO PROBLEMS IN LAST 2 YRS  . Arrhythmia     EPISODE OF IRREG HB-WENT TO ER--AND RELEASED - STATES NO PROBLEMS FOUND  . Pain     RIGHT KNEE--MENISCAL TEAR; STATES SOME DISCOMFORT IN LEFT KNEE  . PONV (postoperative nausea and vomiting)   . Left foot pain     FALLEN INSTEP-WEARS ORTHOTIC IN HER SHOE AND HAS DISCOMFORT IF SHE DOES A LOT OF WALKING  . Basal cell carcinoma of face 05/08/13    X6 face and head  . Cerebrovascular accident 2009    EVIDENCE OF STROKE PER CT OF HEAD DONE AS FOLLOW UP OF IRREGULAR HB--PT DOES NOT KNOW OF ANY SPECIFIC INCIDENT OF HAVING STROKE  . Pneumonia     hx of   . Anxiety   . Anemia     hx of   . Torus palatinus    Principal Problem:   OA (osteoarthritis) of knee Active Problems:   HYPERLIPIDEMIA   DEPRESSION   HYPERTENSION   GERD   CEREBROVASCULAR ACCIDENT, HX OF   Hyponatremia   Hypokalemia  Estimated body mass index is 29.87 kg/(m^2) as calculated from the following:   Height as of this encounter: 5\' 1"  (1.549 m).   Weight as of  this encounter: 71.668 kg (158 lb). Up with therapy Discharge to SNF Diet - Cardiac diet Follow up - next Thursday 05/10/2013 Activity - WBAT Disposition - Skilled nursing facility Condition Upon Discharge - Good D/C Meds - See DC Summary DVT Prophylaxis - Xarelto  Arlee Muslim, PA-C Orthopaedic Surgery 05/03/2014, 9:42 AM

## 2014-05-04 NOTE — Progress Notes (Signed)
Clinical Social Work Department CLINICAL SOCIAL WORK PLACEMENT NOTE 05/04/2014  Patient:  Marie Harrison, Marie Harrison  Account Number:  [id number] Admit date:  04/30/2014  Clinical Social Worker:  Werner Lean, LCSW  Date/time:  05/04/2014 07:17 AM  Clinical Social Work is seeking post-discharge placement for this patient at the following level of care:   SKILLED NURSING   (*CSW will update this form in Epic as items are completed)     Patient/family provided with [facility] of Clinical Social Work's list of facilities offering this level of care within the geographic area requested by the patient (or if unable, by the patient's family).  04/23/2014  Patient/family informed of their freedom to choose among providers that offer the needed level of care, that participate in Medicare, Medicaid or managed care program needed by the patient, have an available bed and are willing to accept the patient.    Patient/family informed of [facility]' ownership interest in [facility], as well as of the fact that they are under no obligation to receive care at this facility.  PASARR submitted to EDS on 04/30/2014 PASARR number received on 04/30/2014  FL2 transmitted to all facilities in geographic area requested by pt/family on  04/30/2014 FL2 transmitted to all facilities within larger geographic area on   Patient informed that his/her managed care company has contracts with or will negotiate with  certain facilities, including the following:     Patient/family informed of bed offers received:  04/30/2014 Patient chooses bed at [facility] recommends and patient chooses bed at    Patient to be transferred to [facility] on  05/03/2014 Patient to be transferred to facility by FAMILY Patient and family notified of transfer on 05/03/2014 Name of family member notified:  SPOUSE  The following physician request were entered in Epic:   Additional Comments: Pt /  spouse were in agreement with d/c to SNF via car. PT agreed with transport by car. Nsg reviewed d/c summary, scripts, avs. Scripts included in d/c packet.  Werner Lean LCSW [phone]

## 2014-06-22 ENCOUNTER — Other Ambulatory Visit: Payer: Self-pay

## 2014-08-06 ENCOUNTER — Telehealth: Payer: Self-pay | Admitting: Family Medicine

## 2014-08-06 NOTE — Telephone Encounter (Signed)
Pt has poss uti. No appts today. Made appt for tues. Pt took UTA capsules that was given to her a yr ago.  Now its turned her urine blue. Wants to know if urine test would be messed up due to this blue?  Pt would like you to call her.

## 2014-08-07 ENCOUNTER — Ambulatory Visit (INDEPENDENT_AMBULATORY_CARE_PROVIDER_SITE_OTHER): Payer: Medicare Other | Admitting: Family Medicine

## 2014-08-07 ENCOUNTER — Encounter: Payer: Self-pay | Admitting: Family Medicine

## 2014-08-07 DIAGNOSIS — R300 Dysuria: Secondary | ICD-10-CM

## 2014-08-07 LAB — POCT URINALYSIS DIPSTICK
Bilirubin, UA: NEGATIVE
Blood, UA: 2
Glucose, UA: NEGATIVE
Ketones, UA: NEGATIVE
NITRITE UA: NEGATIVE
PH UA: 6.5
PROTEIN UA: 1
Spec Grav, UA: 1.01
UROBILINOGEN UA: 0.2

## 2014-08-07 MED ORDER — AMOXICILLIN 875 MG PO TABS: 875.0000 mg | ORAL_TABLET | Freq: Two times a day (BID) | ORAL | Status: DC

## 2014-08-07 NOTE — Telephone Encounter (Signed)
Patient will be seen in the office today

## 2014-08-07 NOTE — Progress Notes (Signed)
   Subjective:    Patient ID: Marie Harrison, female    DOB: 11/30/1940, 73 y.o.   MRN: [id number]  HPI Marie Harrison is a 73 year old married female nonsmoker who comes in today for evaluation of frequency and dysuria. Her last unit tract infection was about a year ago. It was uncomplicated. She's never had pyelonephritis  No fever chills or back pain  Med wise Cipro and sulfa both make her nauseated and vomiting   Review of Systems    review of systems otherwise negative Objective:   Physical Exam  Well-developed well-nourished female no acute distress vital signs stable she's afebrile  Urinalysis shows 2+ blood 3+ white cells      Assessment & Plan:  Urinary tract infection,,,,,,,,,, amoxicillin one twice a day for 10 days,

## 2014-08-07 NOTE — Progress Notes (Signed)
Pre visit review using our clinic review tool, if applicable. No additional management support is needed unless otherwise documented below in the visit note. 

## 2014-08-07 NOTE — Patient Instructions (Signed)
Amoxicillin 875 mg........... one twice daily till bottle empty  Drink lots of water

## 2014-08-10 ENCOUNTER — Telehealth: Payer: Self-pay | Admitting: Family Medicine

## 2014-08-10 MED ORDER — AMOXICILLIN-POT CLAVULANATE 500-125 MG PO TABS: 1.0000 | ORAL_TABLET | Freq: Two times a day (BID) | ORAL | Status: DC

## 2014-08-10 NOTE — Telephone Encounter (Signed)
Per Dr Sherren Mocha patient should stop the medication she is taking now and start Augmentin.  Left detailed message on machine for patient.

## 2014-08-10 NOTE — Telephone Encounter (Signed)
Pt seen tues by dr todd for uti. Pt states she is not better.  Refused appt (sat clinic) until she spoke w/ rachel. Pt would like a cb.

## 2014-08-20 ENCOUNTER — Telehealth: Payer: Self-pay | Admitting: Family Medicine

## 2014-08-20 DIAGNOSIS — N390 Urinary tract infection, site not specified: Secondary | ICD-10-CM

## 2014-08-20 NOTE — Telephone Encounter (Signed)
Patient states her UTI has come back, urgency and pain.  She would like another anti-biotic called in.  She asked if you could give her a call back so she can talk to you about it.  [facility] [address]

## 2014-08-20 NOTE — Telephone Encounter (Signed)
Per Dr Sherren Mocha patient should go to urology.  Patient is aware and referral placed.

## 2014-08-21 ENCOUNTER — Telehealth: Payer: Self-pay | Admitting: Family Medicine

## 2014-08-21 NOTE — Telephone Encounter (Signed)
Pt states the soones appt she could get w/ [facility] is Sep 14, 2014. Pt has a bladder inf and she would like to know if dr todd will give her med for it, since appt is not until jan. amoxicillin-clavulanate (AUGMENTIN) 500-125 MG per tablet [facility] [address]

## 2014-08-23 MED ORDER — AMOXICILLIN-POT CLAVULANATE 500-125 MG PO TABS: 1.0000 | ORAL_TABLET | Freq: Two times a day (BID) | ORAL | Status: DC

## 2014-08-23 NOTE — Telephone Encounter (Signed)
Pt following up on request. pls advise.

## 2014-08-23 NOTE — Telephone Encounter (Signed)
Okay per Dr Sherren Mocha and Rx sent

## 2014-08-23 NOTE — Telephone Encounter (Signed)
Pt aware rx will be called in later today.

## 2014-09-08 ENCOUNTER — Ambulatory Visit (INDEPENDENT_AMBULATORY_CARE_PROVIDER_SITE_OTHER): Payer: Medicare Other | Admitting: Family Medicine

## 2014-09-08 ENCOUNTER — Encounter: Payer: Self-pay | Admitting: Family Medicine

## 2014-09-08 VITALS — BP 126/70 | HR 90 | Temp 97.9°F | Ht 61.5 in | Wt 165.5 lb

## 2014-09-08 DIAGNOSIS — R319 Hematuria, unspecified: Secondary | ICD-10-CM

## 2014-09-08 DIAGNOSIS — N390 Urinary tract infection, site not specified: Secondary | ICD-10-CM

## 2014-09-08 LAB — POCT URINALYSIS DIPSTICK
Bilirubin, UA: NEGATIVE
Glucose, UA: NEGATIVE
Ketones, UA: NEGATIVE
NITRITE UA: NEGATIVE
PH UA: 6
PROTEIN UA: 30
Spec Grav, UA: 1.015
UROBILINOGEN UA: 0.2

## 2014-09-08 MED ORDER — NITROFURANTOIN MONOHYD MACRO 100 MG PO CAPS: ORAL_CAPSULE | ORAL | Status: DC

## 2014-09-08 NOTE — Progress Notes (Signed)
OFFICE NOTE  09/08/2014  CC:  Chief Complaint  Patient presents with  . Urinary Tract Infection    Was treated for UTI on 12/4 & 12/18. Still c/o urgency & pain. Noticed dark urine yesterday   HPI: Patient is a 74 y.o.  female who is here for urinary complaints. Pt had lower urinary tract infection sx's and was seen in office 12/1 and had abnl UA and was given amoxil, no urine culture sent (pt cannot tolerate GI effects of cipro and bactrim). She got better/sx's resolved with this.  Sx's returned 3-4 days later so augmentin was rx'd and again her sx's resolved.  Then 2d/a she began to get urinary urgency and dysuria.   She has been set up with a urologist 09/14/13 for further evaluation.  No fever, no n/v.  No  CVA/mid back/flank pain.  No hematuria.  Pertinent PMH:  PMH and PSH reviewed.  MEDS:  Outpatient Prescriptions Prior to Visit  Medication Sig Dispense Refill  . acetaminophen (TYLENOL) 325 MG tablet Take 325-650 mg by mouth every 6 (six) hours as needed (Pain).    Marland Kitchen FLUoxetine (PROZAC) 20 MG capsule Take 1 capsule (20 mg total) by mouth every morning. 100 capsule 3  . hydrochlorothiazide (HYDRODIURIL) 25 MG tablet Take 1 tablet (25 mg total) by mouth every morning. 90 tablet 3  . lisinopril (PRINIVIL,ZESTRIL) 40 MG tablet Take 80 mg by mouth every morning.    . loratadine (CLARITIN) 10 MG tablet Take 10 mg by mouth daily as needed for allergies.     Marland Kitchen LORazepam (ATIVAN) 0.5 MG tablet Take 0.25 mg by mouth at bedtime as needed for sleep.    Marland Kitchen lovastatin (MEVACOR) 40 MG tablet Take 1 tablet (40 mg total) by mouth daily with supper. 90 tablet 3  . omeprazole (PRILOSEC) 40 MG capsule Take 40 mg by mouth daily as needed (Acid reflux).    . potassium chloride (K-DUR,KLOR-CON) 10 MEQ tablet Take 1 tablet (10 mEq total) by mouth 2 (two) times daily. 180 tablet 3  . triamcinolone cream (KENALOG) 0.1 % Apply 1 application topically 2 (two) times daily as needed (Itching).    Marland Kitchen amoxicillin  (AMOXIL) 875 MG tablet Take 1 tablet (875 mg total) by mouth 2 (two) times daily. (Patient not taking: Reported on 09/08/2014) 20 tablet 0  . amoxicillin-clavulanate (AUGMENTIN) 500-125 MG per tablet Take 1 tablet (500 mg total) by mouth 2 (two) times daily. (Patient not taking: Reported on 09/08/2014) 14 tablet 0  . bisacodyl (DULCOLAX) 10 MG suppository Place 1 suppository (10 mg total) rectally daily as needed for moderate constipation. (Patient not taking: Reported on 09/08/2014) 12 suppository 0  . docusate sodium 100 MG CAPS Take 100 mg by mouth 2 (two) times daily. (Patient not taking: Reported on 09/08/2014) 10 capsule 0  . HYDROmorphone (DILAUDID) 2 MG tablet Take 1-2 tablets (2-4 mg total) by mouth every 4 (four) hours as needed for moderate pain or severe pain. (Patient not taking: Reported on 09/08/2014) 80 tablet 0  . methocarbamol (ROBAXIN) 500 MG tablet Take 1 tablet (500 mg total) by mouth every 6 (six) hours as needed for muscle spasms. (Patient not taking: Reported on 09/08/2014) 80 tablet 0  . metoCLOPramide (REGLAN) 5 MG tablet Take 1-2 tablets (5-10 mg total) by mouth every 8 (eight) hours as needed for nausea (if ondansetron (ZOFRAN) ineffective.). (Patient not taking: Reported on 09/08/2014) 40 tablet 0  . ondansetron (ZOFRAN) 4 MG tablet Take 1 tablet (4 mg total) by  mouth every 6 (six) hours as needed for nausea. (Patient not taking: Reported on 09/08/2014) 40 tablet 0  . polyethylene glycol (MIRALAX / GLYCOLAX) packet Take 17 g by mouth daily as needed for mild constipation. (Patient not taking: Reported on 09/08/2014) 14 each 0  . rivaroxaban (XARELTO) 10 MG TABS tablet Take 1 tablet (10 mg total) by mouth daily with breakfast. Take Xarelto for two and a half more weeks, then discontinue Xarelto. Once the patient has completed the Xarelto, they may resume the 81 mg Aspirin. (Patient not taking: Reported on 09/08/2014) 18 tablet 0  . traMADol (ULTRAM) 50 MG tablet Take 1-2 tablets (50-100 mg total)  by mouth every 6 (six) hours as needed (mild pain). (Patient not taking: Reported on 09/08/2014) 60 tablet 1   No facility-administered medications prior to visit.    PE: Blood pressure 126/70, pulse 90, temperature 97.9 F (36.6 C), temperature source Oral, height 5' 1.5" (1.562 m), weight 165 lb 8 oz (75.07 kg), SpO2 97 %. Gen: Alert, well appearing.  Patient is oriented to person, place, time, and situation. No further exam today.  LAB: CC UA today- Mod leukocytes, large blood, otherwise normal.  IMPRESSION AND PLAN:  Recurrent lower UTI (3 in the last month). Will treat with macrobid 100 bid x 3d, then take 1 qd until she has appt with urologist.  An After Visit Summary was printed and given to the patient.  FOLLOW UP: prn

## 2014-09-08 NOTE — Progress Notes (Signed)
Pre visit review using our clinic review tool, if applicable. No additional management support is needed unless otherwise documented below in the visit note. 

## 2014-09-10 ENCOUNTER — Telehealth: Payer: Self-pay | Admitting: Family Medicine

## 2014-09-10 NOTE — Telephone Encounter (Signed)
PLEASE NOTE: All timestamps contained within this report are represented as [place] Standard Time. CONFIDENTIALTY NOTICE: This fax transmission is intended only for the addressee. It contains information that is legally privileged, confidential or otherwise protected from use or disclosure. If you are not the intended recipient, you are strictly prohibited from reviewing, disclosing, copying using or disseminating any of this information or taking any action in reliance on or regarding this information. If you have received this fax in error, please notify us immediately by telephone so that we can arrange for its return to [place]. Phone: [phone], Toll-Free: [phone], Fax: [phone] Page: 1 of 2 Call Id: [id number] [facility] Night - Client [place] Patient Name: Marie Harrison Gender: Female DOB: [birth year] Age: 74 Y 88 M 9 D Return Phone Number: [phone] (Primary) Address: [address] City/State/Zip: [place] [address] Client [facility] Night - Client Client Site [facility] - Night Physician Todd, Speculator Type Call Call Type Triage / Clinical Relationship To Patient Self Return Phone Number [phone] (Primary) Chief Complaint Prescription Refill or Medication Request (non symptomatic) Initial Comment Caller states she thinks she has a UTI, wants meds called in. PreDisposition Call Doctor Nurse Assessment Nurse: Thad Ranger, RN, Langley Gauss Date/Time (Eastern Time): 09/08/2014 11:27:38 AM Confirm and document reason for call. If symptomatic, describe symptoms. ---Caller states she thinks she has a UTI, wants meds called in. States she has a UTI (3rd UTI since 08/10/14). States on 12/4 she was placed on Amoxicillin then abx changed to Amox/Clav 3 days later due to Amox not working. States she was on 500mg  po bid x 7 days. She then got another UTI on 08/24/14, and  the Amox/Clav was renewed and made her an appt with a Urologist but cant get in untill 09/14/14. States she was seen last on 12/4 and tx over the phone on the 12/18. Allergies: Sulfa, Cipro (makes her nauseated and dizzy) Has never been on Macrodantin. Has the patient traveled out of the country within the last 30 days? ---Not Applicable Does the patient require triage? ---Yes Related visit to physician within the last 2 weeks? ---No Does the PT have any chronic conditions? (i.e. diabetes, asthma, etc.) ---Yes List chronic conditions. ---TKR Guidelines Guideline Title Affirmed Question Affirmed Notes Nurse Date/Time Eilene Ghazi Time) Urination Pain - Female Side (flank) or lower back pain present Flank pain right Carmon, RN, Denise 09/08/2014 11:33:42 AM Disp. Time Eilene Ghazi Time) Disposition Final User 09/08/2014 11:38:20 AM See Physician within 4 Hours (or PCP triage) Yes Carmon, RN, Langley Gauss PLEASE NOTE: All timestamps contained within this report are represented as [place] Standard Time. CONFIDENTIALTY NOTICE: This fax transmission is intended only for the addressee. It contains information that is legally privileged, confidential or otherwise protected from use or disclosure. If you are not the intended recipient, you are strictly prohibited from reviewing, disclosing, copying using or disseminating any of this information or taking any action in reliance on or regarding this information. If you have received this fax in error, please notify us immediately by telephone so that we can arrange for its return to [place]. Phone: [phone], Toll-Free: [phone], Fax: [phone] Page: 2 of 2 Call Id: [id number] [place] Understands: Yes Disagree/Comply: Comply Care Advice Given Per Guideline SEE PHYSICIAN WITHIN 4 HOURS (or PCP triage): PAIN MEDICINES: ACETAMINOPHEN (E.G., TYLENOL): * Another choice is to take 1,000 mg (two 500 mg pills) every 8 hours as needed. Each Extra Strength Tylenol pill  has 500 mg of acetaminophen. The most you should take each day is 3,000 mg (6 Extra Strength pills a day). IBUPROFEN (E.G., MOTRIN, ADVIL): * Another choice is to take 600 mg (three 200 mg pills) by mouth every 8 hours as needed. CAUTION - NSAIDS (E.G., IBUPROFEN, NAPROXEN): * You may take this medicine with or without food. Taking it with food or milk may lessen the chance the drug will upset your stomach. * GASTROINTESTINAL RISK: There is an increased risk of stomach ulcers, GI bleeding, perforation. CALL BACK IF: * You become worse. CARE ADVICE given per Urination Pain - Female (Adult) guideline. After Care Instructions Given Call Event Type User Date / Time Description Comments User: Norton Blizzard Date/Time Eilene Ghazi Time): 09/08/2014 11:26:11 AM HAs not heard from a nurse. her cell is [phone] Referrals [facility]

## 2014-09-11 LAB — CULTURE, URINE COMPREHENSIVE: Colony Count: 1000

## 2014-11-12 ENCOUNTER — Other Ambulatory Visit (HOSPITAL_COMMUNITY): Payer: Self-pay | Admitting: Obstetrics and Gynecology

## 2014-11-12 DIAGNOSIS — M8580 Other specified disorders of bone density and structure, unspecified site: Secondary | ICD-10-CM

## 2014-11-16 ENCOUNTER — Other Ambulatory Visit: Payer: Self-pay | Admitting: Obstetrics and Gynecology

## 2014-11-21 ENCOUNTER — Other Ambulatory Visit: Payer: Self-pay | Admitting: Family Medicine

## 2014-11-21 ENCOUNTER — Encounter (HOSPITAL_COMMUNITY): Payer: Self-pay

## 2014-11-21 ENCOUNTER — Encounter (HOSPITAL_COMMUNITY)
Admission: RE | Admit: 2014-11-21 | Discharge: 2014-11-21 | Disposition: A | Discharge status: Home / Self Care | Payer: Medicare Other | Source: Ambulatory Visit | Attending: Obstetrics and Gynecology | Admitting: Obstetrics and Gynecology

## 2014-11-21 ENCOUNTER — Other Ambulatory Visit: Payer: Self-pay

## 2014-11-21 DIAGNOSIS — Z01810 Encounter for preprocedural cardiovascular examination: Secondary | ICD-10-CM | POA: Insufficient documentation

## 2014-11-21 DIAGNOSIS — Z01812 Encounter for preprocedural laboratory examination: Secondary | ICD-10-CM | POA: Diagnosis not present

## 2014-11-21 LAB — CBC
HCT: 34.9 % — ABNORMAL LOW (ref 36.0–46.0)
Hemoglobin: 11.5 g/dL — ABNORMAL LOW (ref 12.0–15.0)
MCH: 31.9 pg (ref 26.0–34.0)
MCHC: 33 g/dL (ref 30.0–36.0)
MCV: 96.7 fL (ref 78.0–100.0)
Platelets: 191 10*3/uL (ref 150–400)
RBC: 3.61 MIL/uL — ABNORMAL LOW (ref 3.87–5.11)
RDW: 17.1 % — AB (ref 11.5–15.5)
WBC: 8.3 10*3/uL (ref 4.0–10.5)

## 2014-11-21 LAB — BASIC METABOLIC PANEL
ANION GAP: 6 (ref 5–15)
BUN: 17 mg/dL (ref 6–23)
CHLORIDE: 99 mmol/L (ref 96–112)
CO2: 31 mmol/L (ref 19–32)
Calcium: 9.4 mg/dL (ref 8.4–10.5)
Creatinine, Ser: 0.58 mg/dL (ref 0.50–1.10)
GFR calc non Af Amer: 88 mL/min — ABNORMAL LOW (ref >90)
Glucose, Bld: 91 mg/dL (ref 70–99)
Potassium: 4.3 mmol/L (ref 3.5–5.1)
Sodium: 136 mmol/L (ref 135–145)

## 2014-11-21 NOTE — Patient Instructions (Addendum)
   Your procedure is scheduled on: MARCH 23 AT 1030AM  Enter through the Main Entrance of [facility] at: [place] up the phone at the desk and dial [phone] and inform us of your arrival.  Please call this number if you have any problems the morning of surgery: [phone]  Remember: Do not eat food after midnight: Do not drink clear liquids after: Take these medicines the morning of surgery with a SIP OF WATER: TAKE BLOOD PRESSURE MEDS  IN THE AM , POTASSIUM AND OMEPROZALE   Do not wear jewelry, make-up, or FINGER nail polish No metal in your hair or on your body. Do not wear lotions, powders, perfumes.  You may wear deodorant.  Do not bring valuables to the hospital. Contacts, dentures or bridgework may not be worn into surgery.      Patients discharged on the day of surgery will not be allowed to drive home.

## 2014-11-28 ENCOUNTER — Ambulatory Visit (HOSPITAL_COMMUNITY): Payer: Medicare Other | Admitting: Certified Registered Nurse Anesthetist

## 2014-11-28 ENCOUNTER — Ambulatory Visit (HOSPITAL_COMMUNITY)
Admission: RE | Admit: 2014-11-28 | Discharge: 2014-11-28 | Disposition: A | Discharge status: Home / Self Care | Payer: Medicare Other | Source: Ambulatory Visit | Attending: Obstetrics and Gynecology | Admitting: Obstetrics and Gynecology

## 2014-11-28 ENCOUNTER — Encounter (HOSPITAL_COMMUNITY)
Admission: RE | Disposition: A | Discharge status: Home / Self Care | Payer: Self-pay | Source: Ambulatory Visit | Attending: Obstetrics and Gynecology

## 2014-11-28 DIAGNOSIS — Z85828 Personal history of other malignant neoplasm of skin: Secondary | ICD-10-CM | POA: Diagnosis not present

## 2014-11-28 DIAGNOSIS — N840 Polyp of corpus uteri: Secondary | ICD-10-CM | POA: Diagnosis not present

## 2014-11-28 DIAGNOSIS — R938 Abnormal findings on diagnostic imaging of other specified body structures: Secondary | ICD-10-CM | POA: Diagnosis present

## 2014-11-28 DIAGNOSIS — Z9889 Other specified postprocedural states: Secondary | ICD-10-CM | POA: Insufficient documentation

## 2014-11-28 DIAGNOSIS — Z87891 Personal history of nicotine dependence: Secondary | ICD-10-CM | POA: Diagnosis not present

## 2014-11-28 DIAGNOSIS — M1990 Unspecified osteoarthritis, unspecified site: Secondary | ICD-10-CM | POA: Diagnosis not present

## 2014-11-28 DIAGNOSIS — K219 Gastro-esophageal reflux disease without esophagitis: Secondary | ICD-10-CM | POA: Insufficient documentation

## 2014-11-28 DIAGNOSIS — Z8673 Personal history of transient ischemic attack (TIA), and cerebral infarction without residual deficits: Secondary | ICD-10-CM | POA: Diagnosis not present

## 2014-11-28 DIAGNOSIS — I10 Essential (primary) hypertension: Secondary | ICD-10-CM | POA: Diagnosis not present

## 2014-11-28 HISTORY — PX: DILATATION & CURRETTAGE/HYSTEROSCOPY WITH RESECTOCOPE: SHX5572

## 2014-11-28 SURGERY — DILATATION & CURETTAGE/HYSTEROSCOPY WITH RESECTOCOPE
Anesthesia: Choice | Site: Vagina

## 2014-11-28 MED ORDER — PROPOFOL 10 MG/ML IV BOLUS
INTRAVENOUS | Status: AC
  Filled 2014-11-28: qty 20

## 2014-11-28 MED ORDER — CHLOROPROCAINE HCL 1 % IJ SOLN
INTRAMUSCULAR | Status: AC
  Filled 2014-11-28: qty 30

## 2014-11-28 MED ORDER — EPHEDRINE 5 MG/ML INJ
INTRAVENOUS | Status: AC
  Filled 2014-11-28: qty 10

## 2014-11-28 MED ORDER — LIDOCAINE HCL (CARDIAC) 20 MG/ML IV SOLN
INTRAVENOUS | Status: DC | PRN
  Administered 2014-11-28: 60 mg via INTRAVENOUS

## 2014-11-28 MED ORDER — PROPOFOL 10 MG/ML IV BOLUS
INTRAVENOUS | Status: DC | PRN
  Administered 2014-11-28: 130 mg via INTRAVENOUS

## 2014-11-28 MED ORDER — EPHEDRINE SULFATE 50 MG/ML IJ SOLN
INTRAMUSCULAR | Status: DC | PRN
Start: 2014-11-28 — End: 2014-11-28
  Administered 2014-11-28 (×4): 10 mg via INTRAVENOUS

## 2014-11-28 MED ORDER — LIDOCAINE HCL (CARDIAC) 20 MG/ML IV SOLN
INTRAVENOUS | Status: AC
  Filled 2014-11-28: qty 5

## 2014-11-28 MED ORDER — ONDANSETRON HCL 4 MG/2ML IJ SOLN
INTRAMUSCULAR | Status: AC
  Filled 2014-11-28: qty 2

## 2014-11-28 MED ORDER — LACTATED RINGERS IV SOLN
INTRAVENOUS | Status: DC
  Administered 2014-11-28 (×2): via INTRAVENOUS

## 2014-11-28 MED ORDER — FENTANYL CITRATE 0.05 MG/ML IJ SOLN: 25.0000 ug | INTRAMUSCULAR | Status: DC | PRN

## 2014-11-28 MED ORDER — FENTANYL CITRATE 0.05 MG/ML IJ SOLN
INTRAMUSCULAR | Status: DC | PRN
  Administered 2014-11-28 (×2): 50 ug via INTRAVENOUS

## 2014-11-28 MED ORDER — ONDANSETRON HCL 4 MG/2ML IJ SOLN
INTRAMUSCULAR | Status: DC | PRN
  Administered 2014-11-28: 4 mg via INTRAVENOUS

## 2014-11-28 MED ORDER — FENTANYL CITRATE 0.05 MG/ML IJ SOLN
INTRAMUSCULAR | Status: AC
  Filled 2014-11-28: qty 2

## 2014-11-28 MED ORDER — IBUPROFEN 800 MG PO TABS: 800.0000 mg | ORAL_TABLET | Freq: Three times a day (TID) | ORAL | Status: DC | PRN

## 2014-11-28 SURGICAL SUPPLY — 17 items
CANISTER SUCT 3000ML (MISCELLANEOUS) ×2 IMPLANT
CATH ROBINSON RED A/P 16FR (CATHETERS) ×2 IMPLANT
CLOTH BEACON ORANGE TIMEOUT ST (SAFETY) ×2 IMPLANT
CONTAINER PREFILL 10% NBF 60ML (FORM) ×4 IMPLANT
ELECT REM PT RETURN 9FT ADLT (ELECTROSURGICAL) ×2
ELECTRODE REM PT RTRN 9FT ADLT (ELECTROSURGICAL) ×1 IMPLANT
GLOVE BIOGEL PI IND STRL 7.0 (GLOVE) ×2 IMPLANT
GLOVE BIOGEL PI INDICATOR 7.0 (GLOVE) ×2
GLOVE ECLIPSE 6.5 STRL STRAW (GLOVE) ×2 IMPLANT
GOWN STRL REUS W/TWL LRG LVL3 (GOWN DISPOSABLE) ×4 IMPLANT
LOOP ANGLED CUTTING 22FR (CUTTING LOOP) ×1 IMPLANT
PACK VAGINAL MINOR WOMEN LF (CUSTOM PROCEDURE TRAY) ×2 IMPLANT
PAD OB MATERNITY 4.3X12.25 (PERSONAL CARE ITEMS) ×2 IMPLANT
TOWEL OR 17X24 6PK STRL BLUE (TOWEL DISPOSABLE) ×4 IMPLANT
TUBING AQUILEX INFLOW (TUBING) ×2 IMPLANT
TUBING AQUILEX OUTFLOW (TUBING) ×2 IMPLANT
WATER STERILE IRR 1000ML POUR (IV SOLUTION) ×2 IMPLANT

## 2014-11-28 NOTE — Brief Op Note (Signed)
11/28/2014  10:21 AM  PATIENT:  Marie Harrison  74 y.o. female  PRE-OPERATIVE DIAGNOSIS:  Endometrial Mass/Polyp, endometrial thickening on sonogram  POST-OPERATIVE DIAGNOSIS:  Endometrial polyp  PROCEDURE:  Diagnostic hysteroscopy, hysteroscopic resection of endometrial polyp, D&C  SURGEON:  Surgeon(s) and Role:    * Servando Salina, MD - Primary  PHYSICIAN ASSISTANT:   ASSISTANTS: none   ANESTHESIA:   none Findings: left large endom polyp, tubal ostia seen bilaterally, atrophic endometrium, endocervical canal no lesion EBL:  Total I/O In: 1100 [I.V.:1100] Out: 255 [Urine:250; Blood:5]  BLOOD ADMINISTERED:none  DRAINS: none   LOCAL MEDICATIONS USED:  NONE  SPECIMEN:  Source of Specimen:  EMC with  endom polyp  DISPOSITION OF SPECIMEN:  PATHOLOGY  COUNTS:  YES  TOURNIQUET:  * No tourniquets in log *  DICTATION: .Other Dictation: Dictation Number [id number]  PLAN OF CARE: Discharge to home after PACU  PATIENT DISPOSITION:  PACU - hemodynamically stable.   Delay start of Pharmacological VTE agent (>24hrs) due to surgical blood loss or risk of bleeding: no

## 2014-11-28 NOTE — H&P (Signed)
Marie Harrison is an 74 y.o. female. PMP black female presents for dx hysteroscopy, D&C , resection of endometrial mass found on sonohysterogram done for endometrial thickening on sono. Pt denies PMB  Pertinent Gynecological History: Menses:PMP   Menstrual History: Menarche age: n/a No LMP recorded. Patient is postmenopausal.    Past Medical History  Diagnosis Date  . Depression   . GERD (gastroesophageal reflux disease)   . Hyperlipidemia   . Hypertension   . Urinary incontinence   . Arthritis   . Chicken pox   . Fainting spell     SEVERAL YRS AGO--PT STATES HER POTASSIUM WAS LOW AT THE TIME--NO PROBLEMS SINCE AND TAKES DAILY POTASSIUM  . Ulcers of yaws   . Hx: UTI (urinary tract infection)     NO PROBLEMS IN LAST 2 YRS  . Arrhythmia     EPISODE OF IRREG HB-WENT TO ER--AND RELEASED - STATES NO PROBLEMS FOUND  . Pain     RIGHT KNEE--MENISCAL TEAR; STATES SOME DISCOMFORT IN LEFT KNEE  . PONV (postoperative nausea and vomiting)   . Left foot pain     FALLEN INSTEP-WEARS ORTHOTIC IN HER SHOE AND HAS DISCOMFORT IF SHE DOES A LOT OF WALKING  . Basal cell carcinoma of face 05/08/13    X6 face and head  . Cerebrovascular accident 2009    EVIDENCE OF STROKE PER CT OF HEAD DONE AS FOLLOW UP OF IRREGULAR HB--PT DOES NOT KNOW OF ANY SPECIFIC INCIDENT OF HAVING STROKE  . Pneumonia     hx of   . Anxiety   . Anemia     hx of   . Torus palatinus     Past Surgical History  Procedure Laterality Date  . Cataract extraction    . Right foot surgery    . Dilation and curettage of uterus    . Tonsillectomy    . Knee arthroscopy Right 12/07/2012    Procedure: RIGHT KNEE ARTHROSCOPY WITH DEBRIDEMENT AND PARTIAL MEDIAL MENISECTOMY  AND CHONDROPLASTY;  Surgeon: Gearlean Alf, MD;  Location: [facility];  Service: Orthopedics;  Laterality: Right;  . Knee arthroscopy  2001    left   . 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 Cancer Mother     pancreatic   . Diabetes Father   . Heart disease Father   . Stroke Father     Social History:  reports that she has quit smoking. Her smoking use included Cigarettes. She has never used smokeless tobacco. She reports that she drinks alcohol. She reports that she does not use illicit drugs.  Allergies:  Allergies  Allergen Reactions  . Ciprofloxacin Nausea And Vomiting    Light headed  . Codeine Nausea And Vomiting  . Sulfonamide Derivatives Nausea And Vomiting    Light headed    No prescriptions prior to admission    Review of Systems  All other systems reviewed and are negative.   There were no vitals taken for this visit. Physical Exam  Constitutional: She is oriented to person, place, and time. She appears well-developed and well-nourished.  HENT:  Head: Atraumatic.  Eyes: EOM are normal.  Neck: Neck supple.  Cardiovascular: Regular rhythm.   Respiratory: Effort normal and breath sounds normal.  GI: Soft.  Genitourinary: Vagina normal.  Musculoskeletal: She exhibits  no edema.  Neurological: She is alert and oriented to person, place, and time.  Skin: Skin is warm and dry.  Psychiatric: She has a normal mood and affect.  vulva no lesion Vagina: atrophic changes Cervix; closed Uterus sl enlarged adnex nonpalpable  No results found for this or any previous visit (from the past 24 hour(s)).  CBC    Component Value Date/Time   WBC 8.3 11/21/2014 1045   RBC 3.61* 11/21/2014 1045   HGB 11.5* 11/21/2014 1045   HCT 34.9* 11/21/2014 1045   PLT 191 11/21/2014 1045   MCV 96.7 11/21/2014 1045   MCH 31.9 11/21/2014 1045   MCHC 33.0 11/21/2014 1045   RDW 17.1* 11/21/2014 1045   LYMPHSABS 2.0 04/10/2014 0912   MONOABS 0.6 04/10/2014 0912   EOSABS 0.3 04/10/2014 0912   BASOSABS 0.0 04/10/2014 0912    BMET    Component Value  Date/Time   NA 136 11/21/2014 1045   K 4.3 11/21/2014 1045   CL 99 11/21/2014 1045   CO2 31 11/21/2014 1045   GLUCOSE 91 11/21/2014 1045   BUN 17 11/21/2014 1045   CREATININE 0.58 11/21/2014 1045   CALCIUM 9.4 11/21/2014 1045   GFRNONAA 88* 11/21/2014 1045   GFRAA >90 11/21/2014 1045     Assessment/Plan: Endometrial thickening on sonogram Endometrial mass P) dx hysteroscopy, resection of endometrial mass, D&C. Procedure explained. Risk of surgery reviewed including bleeding, infection, injury to surrounding organ structures, thermal injury, fluid overload and uterine perforation. All ? answered  Arlee Bossard A 11/28/2014, 6:44 AM

## 2014-11-28 NOTE — Op Note (Signed)
NAMEREE, ALCALDE              ACCOUNT NO.:  [id number]  MEDICAL RECORD NO.:  [id number]  LOCATION:  [facility]                          FACILITY:  [facility]  PHYSICIAN:  Servando Salina, M.D.DATE OF BIRTH:  [birth year]  DATE OF PROCEDURE:  11/28/2014 DATE OF DISCHARGE:  11/28/2014                              OPERATIVE REPORT   PREOPERATIVE DIAGNOSIS:  Endometrial mass, endometrial thickening on ultrasound.  POSTOPERATIVE DIAGNOSIS:  Endometrial polyp, atrophic endometrium.  PROCEDURES:  Diagnostic hysteroscopy, hysteroscopic resection of endometrial polyp, dilation and curettage.  ANESTHESIA:  General.  SURGEON:  Servando Salina, M.D.  ASSISTANT:  None.  DESCRIPTION OF PROCEDURE:  Under adequate general anesthesia, the patient was placed in the dorsal lithotomy position.  She was sterilely prepped and draped in usual fashion.  The bladder was catheterized for moderate amount of urine.  Examination under anesthesia revealed anteverted uterus.  No adnexal masses could be appreciated.  Bivalve speculum was placed in the vagina.  Single-tooth tenaculum was placed on the anterior lip of the cervix.  The cervix was easily dilated up to #31 [facility] dilator.  A glycine-primed resectoscope with a single loop was inserted into the uterine cavity.  There was a large polypoid lesion arising off the left lateral wall, this was resected, pieces removed. The resectoscope removed.  Cavity was gently curetted for scant amount of tissue.  The resectoscope was inserted.  Small tissues were then removed through the resectoscope.  Both tubal ostia could be seen.  No other lesions were noted. Atrophic endometrium noted. The endocervical canal was without lesions. At that point when all the tissue had been removed, all instruments were then removed from the vagina.  SPECIMEN:  Labeled endometrial curetting with endometrial polyp were sent to Pathology.  ESTIMATED BLOOD LOSS: less than 15  mL.  FLUID DEFICIT:  100 mL.  COMPLICATION:  None.  The patient tolerated the procedure well, was transferred to the recovery room in stable condition.     Servando Salina, M.D.     [place]/MEDQ  D:  11/28/2014  T:  11/28/2014  Job:  [id number]

## 2014-11-28 NOTE — Anesthesia Postprocedure Evaluation (Signed)
  Anesthesia Post-op Note  Patient: Marie Harrison  Procedure(s) Performed: Procedure(s): DILATATION & CURETTAGE/HYSTEROSCOPY WITH RESECTOCOPE (N/A) Patient is awake and responsive. Pain and nausea are reasonably well controlled. Vital signs are stable and clinically acceptable. Oxygen saturation is clinically acceptable. There are no apparent anesthetic complications at this time. Patient is ready for discharge.

## 2014-11-28 NOTE — Discharge Instructions (Signed)
CALL  IF TEMP>100.4, NOTHING PER VAGINA X 2 WK, CALL IF SOAKING A MAXI  PAD EVERY HOUR OR MORE FREQUENTLY.    DISCHARGE INSTRUCTIONS: HYSTEROSCOPY  The following instructions have been prepared to help you care for yourself upon your return home.  Personal hygiene:  Use sanitary pads for vaginal drainage, not tampons.  Shower the day after your procedure.  NO tub baths, pools or Jacuzzis for 2-3 weeks.  Wipe front to back after using the bathroom.  Activity and limitations:  Do NOT drive or operate any equipment for 24 hours. The effects of anesthesia are still present and drowsiness may result.  Do NOT rest in bed all day.  Walking is encouraged.  Walk up and down stairs slowly.  You may resume your normal activity in one to two days or as indicated by your physician. Sexual activity: NO intercourse for at least 2 weeks after the procedure, or as indicated by your Doctor.  Diet: Eat a light meal as desired this evening. You may resume your usual diet tomorrow.  Return to Work: You may resume your work activities in one to two days or as indicated by Marine scientist.  What to expect after your surgery: Expect to have vaginal bleeding/discharge for 2-3 days and spotting for up to 10 days. It is not unusual to have soreness for up to 1-2 weeks. You may have a slight burning sensation when you urinate for the first day. Mild cramps may continue for a couple of days. You may have a regular period in 2-6 weeks.  Call your doctor for any of the following:  Excessive vaginal bleeding or clotting, saturating and changing one pad every hour.  Inability to urinate 6 hours after discharge from hospital.  Pain not relieved by pain medication.  Fever of 100.4 F or greater.  Unusual vaginal discharge or odor.  Return to office _________________Call for an appointment ___________________ Patients signature: ______________________ Nurses signature  ________________________  [place] [address] [phone]

## 2014-11-28 NOTE — Anesthesia Procedure Notes (Signed)
Procedure Name: LMA Insertion Date/Time: 11/28/2014 9:20 AM Performed by: Hewitt Blade Pre-anesthesia Checklist: Patient identified, Patient being monitored, Emergency Drugs available and Suction available Patient Re-evaluated:Patient Re-evaluated prior to inductionOxygen Delivery Method: Circle system utilized Preoxygenation: Pre-oxygenation with 100% oxygen Intubation Type: IV induction LMA: LMA inserted LMA Size: 4.0 Number of attempts: 1 Placement Confirmation: ETT inserted through vocal cords under direct vision,  positive ETCO2 and breath sounds checked- equal and bilateral Tube secured with: Tape Dental Injury: Teeth and Oropharynx as per pre-operative assessment

## 2014-11-28 NOTE — Transfer of Care (Signed)
Immediate Anesthesia Transfer of Care Note  Patient: Marie Harrison  Procedure(s) Performed: Procedure(s): Chauvin (N/A)  Patient Location: PACU  Anesthesia Type:General  Level of Consciousness: awake, alert  and oriented  Airway & Oxygen Therapy: Patient Spontanous Breathing and Patient connected to nasal cannula oxygen  Post-op Assessment: Report given to RN, Post -op Vital signs reviewed and stable and Patient moving all extremities  Post vital signs: Reviewed and stable  Last Vitals:  Filed Vitals:   11/28/14 0831  BP: 151/67  Pulse: 81  Temp: 36.3 C  Resp: 20    Complications: No apparent anesthesia complications

## 2014-11-28 NOTE — Anesthesia Preprocedure Evaluation (Signed)
Anesthesia Evaluation  Patient identified by MRN, date of birth, ID band Patient awake    Reviewed: Allergy & Precautions, H&P , Patient's Chart, lab work & pertinent test results, reviewed documented beta blocker date and time   Airway Mallampati: II  TM Distance: >3 FB Neck ROM: full    Dental no notable dental hx.    Pulmonary former smoker,  breath sounds clear to auscultation  Pulmonary exam normal       Cardiovascular hypertension, Rhythm:regular Rate:Normal     Neuro/Psych CVA    GI/Hepatic GERD-  ,  Endo/Other    Renal/GU      Musculoskeletal   Abdominal   Peds  Hematology   Anesthesia Other Findings No stroke sx; Vague Hx relating to CVA found on CT Torus noted; considerable size Chest clear Nl lytes  Reproductive/Obstetrics                             Anesthesia Physical Anesthesia Plan  ASA: III  Anesthesia Plan:    Post-op Pain Management:    Induction: Intravenous  Airway Management Planned: LMA  Additional Equipment:   Intra-op Plan:   Post-operative Plan:   Informed Consent: I have reviewed the patients History and Physical, chart, labs and discussed the procedure including the risks, benefits and alternatives for the proposed anesthesia with the patient or authorized representative who has indicated his/her understanding and acceptance.   Dental Advisory Given and Dental advisory given  Plan Discussed with: CRNA and Surgeon  Anesthesia Plan Comments: (Discussed GA with LMA, possible sore throat, potential need to switch to ETT, N/V, pulmonary aspiration. Questions answered. )        Anesthesia Quick Evaluation

## 2014-11-30 ENCOUNTER — Encounter (HOSPITAL_COMMUNITY): Payer: Self-pay | Admitting: Obstetrics and Gynecology

## 2014-12-18 ENCOUNTER — Ambulatory Visit (INDEPENDENT_AMBULATORY_CARE_PROVIDER_SITE_OTHER): Payer: Medicare Other | Admitting: Family Medicine

## 2014-12-18 VITALS — BP 140/80 | Temp 98.9°F | Wt 163.8 lb

## 2014-12-18 DIAGNOSIS — F329 Major depressive disorder, single episode, unspecified: Secondary | ICD-10-CM | POA: Diagnosis not present

## 2014-12-18 DIAGNOSIS — E785 Hyperlipidemia, unspecified: Secondary | ICD-10-CM

## 2014-12-18 DIAGNOSIS — Z23 Encounter for immunization: Secondary | ICD-10-CM

## 2014-12-18 DIAGNOSIS — I10 Essential (primary) hypertension: Secondary | ICD-10-CM

## 2014-12-18 DIAGNOSIS — F32A Depression, unspecified: Secondary | ICD-10-CM

## 2014-12-18 DIAGNOSIS — Z0000 Encounter for general adult medical examination without abnormal findings: Secondary | ICD-10-CM | POA: Diagnosis not present

## 2014-12-18 DIAGNOSIS — K219 Gastro-esophageal reflux disease without esophagitis: Secondary | ICD-10-CM

## 2014-12-18 DIAGNOSIS — J301 Allergic rhinitis due to pollen: Secondary | ICD-10-CM | POA: Diagnosis not present

## 2014-12-18 DIAGNOSIS — M1711 Unilateral primary osteoarthritis, right knee: Secondary | ICD-10-CM

## 2014-12-18 NOTE — Patient Instructions (Signed)
Return on Thursday to read the TB skin test

## 2014-12-18 NOTE — Progress Notes (Signed)
   Subjective:    Patient ID: Marie Harrison, female    DOB: 03/15/1941, 74 y.o.   MRN: [id number]  HPI Marie Harrison is a 74 year old female who comes in today to fill out paperwork so she and her husband can moved to an extended care facility........[facility] but living independently for now..... [facility]  We did a complete physical examination in the fall of 2015. In the meantime she's had a total knee replacement and a D&C because of dysfunctional uterine bleeding. Flexion a D&C showed no cancer. She's recovering from the knee replacement and is able to ambulate much better.  Past medical history social history family history medication history allergy history all reviewed no changes  Med list reviewed there is no changes   Review of Systems    review of systems negative Objective:   Physical Exam No physical exam done       Assessment & Plan:  I think she is an ideal candidate for this facility. She and her husband will be living independently at this juncture  Up-to-date on Pneumovax and PPD were given. She return on Thursday to read the TB skin test

## 2014-12-18 NOTE — Progress Notes (Signed)
Pre visit review using our clinic review tool, if applicable. No additional management support is needed unless otherwise documented below in the visit note. 

## 2014-12-21 LAB — TB SKIN TEST
INDURATION: 0 mm
TB SKIN TEST: NEGATIVE

## 2015-01-25 ENCOUNTER — Encounter: Payer: Self-pay | Admitting: Gastroenterology

## 2015-05-28 ENCOUNTER — Other Ambulatory Visit (HOSPITAL_BASED_OUTPATIENT_CLINIC_OR_DEPARTMENT_OTHER): Payer: Self-pay | Admitting: Nurse Practitioner

## 2015-05-28 DIAGNOSIS — Z780 Asymptomatic menopausal state: Secondary | ICD-10-CM

## 2015-05-28 DIAGNOSIS — Z1231 Encounter for screening mammogram for malignant neoplasm of breast: Secondary | ICD-10-CM

## 2015-05-31 ENCOUNTER — Ambulatory Visit (HOSPITAL_BASED_OUTPATIENT_CLINIC_OR_DEPARTMENT_OTHER)
Admission: RE | Admit: 2015-05-31 | Discharge: 2015-05-31 | Disposition: A | Discharge status: Home / Self Care | Payer: Medicare Other | Source: Ambulatory Visit | Attending: Nurse Practitioner | Admitting: Nurse Practitioner

## 2015-05-31 DIAGNOSIS — R928 Other abnormal and inconclusive findings on diagnostic imaging of breast: Secondary | ICD-10-CM | POA: Diagnosis not present

## 2015-05-31 DIAGNOSIS — Z1231 Encounter for screening mammogram for malignant neoplasm of breast: Secondary | ICD-10-CM

## 2015-05-31 DIAGNOSIS — Z780 Asymptomatic menopausal state: Secondary | ICD-10-CM | POA: Insufficient documentation

## 2015-05-31 DIAGNOSIS — M8580 Other specified disorders of bone density and structure, unspecified site: Secondary | ICD-10-CM | POA: Diagnosis not present

## 2015-06-04 ENCOUNTER — Other Ambulatory Visit: Payer: Self-pay | Admitting: Nurse Practitioner

## 2015-06-04 DIAGNOSIS — R928 Other abnormal and inconclusive findings on diagnostic imaging of breast: Secondary | ICD-10-CM

## 2015-06-11 ENCOUNTER — Ambulatory Visit
Admission: RE | Admit: 2015-06-11 | Discharge: 2015-06-11 | Disposition: A | Discharge status: Home / Self Care | Payer: Medicare Other | Source: Ambulatory Visit | Attending: Nurse Practitioner | Admitting: Nurse Practitioner

## 2015-06-11 DIAGNOSIS — R928 Other abnormal and inconclusive findings on diagnostic imaging of breast: Secondary | ICD-10-CM

## 2015-07-02 ENCOUNTER — Ambulatory Visit: Payer: Self-pay | Admitting: Orthopedic Surgery

## 2015-07-02 NOTE — Progress Notes (Signed)
Preoperative surgical orders have been place into the Epic hospital system for [facility] on 07/02/2015, 5:35 PM  by Mickel Crow for surgery on 07-24-15.  Preop Knee Scope orders including IV Tylenol and IV Decadron as long as there are no contraindications to the above medications. Arlee Muslim, PA-C

## 2015-09-02 ENCOUNTER — Encounter (HOSPITAL_BASED_OUTPATIENT_CLINIC_OR_DEPARTMENT_OTHER): Payer: Self-pay | Admitting: *Deleted

## 2015-09-02 ENCOUNTER — Emergency Department (HOSPITAL_BASED_OUTPATIENT_CLINIC_OR_DEPARTMENT_OTHER): Payer: Medicare Other

## 2015-09-02 ENCOUNTER — Emergency Department (HOSPITAL_BASED_OUTPATIENT_CLINIC_OR_DEPARTMENT_OTHER)
Admission: EM | Admit: 2015-09-02 | Discharge: 2015-09-02 | Disposition: A | Discharge status: Home / Self Care | Payer: Medicare Other | Attending: Emergency Medicine | Admitting: Emergency Medicine

## 2015-09-02 DIAGNOSIS — Z8701 Personal history of pneumonia (recurrent): Secondary | ICD-10-CM | POA: Diagnosis not present

## 2015-09-02 DIAGNOSIS — Z7982 Long term (current) use of aspirin: Secondary | ICD-10-CM | POA: Diagnosis not present

## 2015-09-02 DIAGNOSIS — Y9289 Other specified places as the place of occurrence of the external cause: Secondary | ICD-10-CM | POA: Diagnosis not present

## 2015-09-02 DIAGNOSIS — M1990 Unspecified osteoarthritis, unspecified site: Secondary | ICD-10-CM | POA: Diagnosis not present

## 2015-09-02 DIAGNOSIS — F419 Anxiety disorder, unspecified: Secondary | ICD-10-CM | POA: Diagnosis not present

## 2015-09-02 DIAGNOSIS — Z85828 Personal history of other malignant neoplasm of skin: Secondary | ICD-10-CM | POA: Insufficient documentation

## 2015-09-02 DIAGNOSIS — S3992XA Unspecified injury of lower back, initial encounter: Secondary | ICD-10-CM | POA: Insufficient documentation

## 2015-09-02 DIAGNOSIS — K219 Gastro-esophageal reflux disease without esophagitis: Secondary | ICD-10-CM | POA: Diagnosis not present

## 2015-09-02 DIAGNOSIS — Y998 Other external cause status: Secondary | ICD-10-CM | POA: Diagnosis not present

## 2015-09-02 DIAGNOSIS — S0990XA Unspecified injury of head, initial encounter: Secondary | ICD-10-CM | POA: Diagnosis not present

## 2015-09-02 DIAGNOSIS — Z862 Personal history of diseases of the blood and blood-forming organs and certain disorders involving the immune mechanism: Secondary | ICD-10-CM | POA: Diagnosis not present

## 2015-09-02 DIAGNOSIS — Z87891 Personal history of nicotine dependence: Secondary | ICD-10-CM | POA: Insufficient documentation

## 2015-09-02 DIAGNOSIS — Z8619 Personal history of other infectious and parasitic diseases: Secondary | ICD-10-CM | POA: Insufficient documentation

## 2015-09-02 DIAGNOSIS — W01198A Fall on same level from slipping, tripping and stumbling with subsequent striking against other object, initial encounter: Secondary | ICD-10-CM | POA: Diagnosis not present

## 2015-09-02 DIAGNOSIS — F329 Major depressive disorder, single episode, unspecified: Secondary | ICD-10-CM | POA: Insufficient documentation

## 2015-09-02 DIAGNOSIS — I10 Essential (primary) hypertension: Secondary | ICD-10-CM | POA: Insufficient documentation

## 2015-09-02 DIAGNOSIS — Z87440 Personal history of urinary (tract) infections: Secondary | ICD-10-CM | POA: Insufficient documentation

## 2015-09-02 DIAGNOSIS — Y9389 Activity, other specified: Secondary | ICD-10-CM | POA: Insufficient documentation

## 2015-09-02 DIAGNOSIS — E785 Hyperlipidemia, unspecified: Secondary | ICD-10-CM | POA: Insufficient documentation

## 2015-09-02 DIAGNOSIS — Z8673 Personal history of transient ischemic attack (TIA), and cerebral infarction without residual deficits: Secondary | ICD-10-CM | POA: Insufficient documentation

## 2015-09-02 DIAGNOSIS — S199XXA Unspecified injury of neck, initial encounter: Secondary | ICD-10-CM | POA: Insufficient documentation

## 2015-09-02 DIAGNOSIS — Z79899 Other long term (current) drug therapy: Secondary | ICD-10-CM | POA: Insufficient documentation

## 2015-09-02 NOTE — ED Provider Notes (Signed)
CSN: [id number]     Arrival date & time 09/02/15  1505 History  By signing my name below, I, Altamease Oiler, attest that this documentation has been prepared under the direction and in the presence of Julianne Rice, MD. Electronically Signed: Altamease Oiler, ED Scribe. 09/02/2015. 6:31 PM   Chief Complaint  Patient presents with  . Fall    The history is provided by the patient. No language interpreter was used.   Marie Harrison is a 74 y.o. female who presents to the Emergency Department complaining of a fall from standing this afternoon near 2 PM. She was playing ping pong when she fell backwards onto her buttocks and struck her head on a window sill.  Associated symptoms include pain at the back of the head, neck pain, and pain at the tailbone. Pt denies LOC and she has been ambulatory since the fall. She is not on a blood thinner apart from aspirin. Pt states that her living facility sent her to the ED for a CT scan of her head.   Past Medical History  Diagnosis Date  . Depression   . GERD (gastroesophageal reflux disease)   . Hyperlipidemia   . Hypertension   . Urinary incontinence   . Arthritis   . Chicken pox   . Fainting spell     SEVERAL YRS AGO--PT STATES HER POTASSIUM WAS LOW AT THE TIME--NO PROBLEMS SINCE AND TAKES DAILY POTASSIUM  . Ulcers of yaws   . Hx: UTI (urinary tract infection)     NO PROBLEMS IN LAST 2 YRS  . Arrhythmia     EPISODE OF IRREG HB-WENT TO ER--AND RELEASED - STATES NO PROBLEMS FOUND  . Pain     RIGHT KNEE--MENISCAL TEAR; STATES SOME DISCOMFORT IN LEFT KNEE  . PONV (postoperative nausea and vomiting)   . Left foot pain     FALLEN INSTEP-WEARS ORTHOTIC IN HER SHOE AND HAS DISCOMFORT IF SHE DOES A LOT OF WALKING  . Basal cell carcinoma of face 05/08/13    X6 face and head  . Cerebrovascular accident ([place]) 2009    EVIDENCE OF STROKE PER CT OF HEAD DONE AS FOLLOW UP OF IRREGULAR HB--PT DOES NOT KNOW OF ANY SPECIFIC INCIDENT OF HAVING STROKE  .  Pneumonia     hx of   . Anxiety   . Anemia     hx of   . Torus palatinus    Past Surgical History  Procedure Laterality Date  . Cataract extraction    . Right foot surgery    . Dilation and curettage of uterus    . Tonsillectomy    . Knee arthroscopy Right 12/07/2012    Procedure: RIGHT KNEE ARTHROSCOPY WITH DEBRIDEMENT AND PARTIAL MEDIAL MENISECTOMY  AND CHONDROPLASTY;  Surgeon: Gearlean Alf, MD;  Location: [facility];  Service: Orthopedics;  Laterality: Right;  . Knee arthroscopy  2001    left   . Total knee arthroplasty Right 04/30/2014    Procedure: RIGHT TOTAL KNEE ARTHROPLASTY;  Surgeon: Gearlean Alf, MD;  Location: [facility];  Service: Orthopedics;  Laterality: Right;  . Dilatation & currettage/hysteroscopy with resectocope N/A 11/28/2014    Procedure: DILATATION & CURETTAGE/HYSTEROSCOPY WITH RESECTOCOPE;  Surgeon: Servando Salina, MD;  Location: [facility];  Service: Gynecology;  Laterality: N/A;   Family History  Problem Relation Age of Onset  . Cancer Mother     pancreatic   . Diabetes Father   . Heart disease Father   . Stroke Father  Social History  Substance Use Topics  . Smoking status: Former Smoker    Types: Cigarettes  . Smokeless tobacco: Never Used     Comment: QUIT SMOKING AT AGE 10--WAS ONLY "OFF AND ON" SMOKER  . Alcohol Use: Yes     Comment: OCCAS WINE OR BEER   OB History    No data available     Review of Systems  Constitutional: Negative for fever and chills.  Eyes: Negative for visual disturbance.  Respiratory: Negative for shortness of breath.   Cardiovascular: Negative for chest pain.  Gastrointestinal: Negative for nausea, vomiting and abdominal pain.  Musculoskeletal: Positive for neck pain. Negative for back pain and neck stiffness.  Skin: Negative for rash and wound.  Neurological: Positive for headaches. Negative for dizziness, syncope, weakness and light-headedness.  All other systems reviewed and are negative.     Allergies   Ciprofloxacin; Codeine; and Sulfonamide derivatives  Home Medications   Prior to Admission medications   Medication Sig Start Date End Date Taking? Authorizing Provider  acetaminophen (TYLENOL) 325 MG tablet Take 325-650 mg by mouth every 6 (six) hours as needed (Pain).    Historical Provider, MD  aspirin EC 81 MG tablet Take 81 mg by mouth daily.    Historical Provider, MD  FLUoxetine (PROZAC) 20 MG capsule Take 1 capsule (20 mg total) by mouth every morning. 04/17/14   Dorena Cookey, MD  hydrochlorothiazide (HYDRODIURIL) 25 MG tablet Take 1 tablet (25 mg total) by mouth every morning. 04/17/14   Dorena Cookey, MD  lisinopril (PRINIVIL,ZESTRIL) 40 MG tablet Take 80 mg by mouth every morning.    Historical Provider, MD  loratadine (CLARITIN) 10 MG tablet Take 10 mg by mouth daily as needed for allergies.     Historical Provider, MD  LORazepam (ATIVAN) 0.5 MG tablet TAKE 0.5 TABLET BY MOUTH AT BEDTIME AS NEEDED FOR SLEEP 11/22/14   Dorena Cookey, MD  lovastatin (MEVACOR) 40 MG tablet Take 1 tablet (40 mg total) by mouth daily with supper. 04/17/14   Dorena Cookey, MD  nitrofurantoin, macrocrystal-monohydrate, (MACROBID) 100 MG capsule 1 tab po bid x 3d, then 1 tab po qd Patient taking differently: Take 100 mg by mouth daily. prophylaxis 09/08/14   Tammi Sou, MD  omeprazole (PRILOSEC) 40 MG capsule Take 40 mg by mouth daily as needed (Acid reflux).    Historical Provider, MD  potassium chloride (K-DUR,KLOR-CON) 10 MEQ tablet Take 1 tablet (10 mEq total) by mouth 2 (two) times daily. 04/17/14   Dorena Cookey, MD  triamcinolone cream (KENALOG) 0.1 % Apply 1 application topically 2 (two) times daily as needed (Itching).    Historical Provider, MD   BP 163/68 mmHg  Pulse 82  Temp(Src) 98.5 F (36.9 C)  Resp 18  Ht 5\' 1"  (1.549 m)  Wt 165 lb (74.844 kg)  BMI 31.19 kg/m2  SpO2 99% Physical Exam  Constitutional: She is oriented to person, place, and time. She appears well-developed and  well-nourished. No distress.  HENT:  Head: Normocephalic and atraumatic.  Mouth/Throat: Oropharynx is clear and moist.  Very mild occipital tenderness with palpation. There is no obvious swelling or deformity.  Eyes: EOM are normal. Pupils are equal, round, and reactive to light.  Neck: Normal range of motion. Neck supple.  Right paracervical tenderness and spasm. No midline tenderness to palpation  Cardiovascular: Normal rate and regular rhythm.  Exam reveals no gallop and no friction rub.   No murmur heard. Pulmonary/Chest: Effort  normal and breath sounds normal. No respiratory distress. She has no wheezes. She has no rales.  Abdominal: Soft. Bowel sounds are normal. She exhibits no distension and no mass. There is no tenderness. There is no rebound and no guarding.  Musculoskeletal: Normal range of motion. She exhibits no edema or tenderness.  No midline thoracic or lumbar tenderness. Very mild sacral and coccygeal tenderness. Full range of motion of bilateral hips. Distal pulses intact.  Neurological: She is alert and oriented to person, place, and time.  Patient is alert and oriented x3 with clear, goal oriented speech. Patient has 5/5 motor in all extremities. Sensation is intact to light touch. Patient has a normal gait and walks without assistance.  Skin: Skin is warm and dry. No rash noted. No erythema.  Psychiatric: She has a normal mood and affect. Her behavior is normal.  Nursing note and vitals reviewed.   ED Course  Procedures (including critical care time) DIAGNOSTIC STUDIES: Oxygen Saturation is 99% on RA,  normal by my interpretation.    COORDINATION OF CARE: 6:29 PM Discussed treatment plan which includes CT head with pt at bedside and pt agreed to plan.  Labs Review Labs Reviewed - No data to display  Imaging Review Ct Head Wo Contrast  09/02/2015  CLINICAL DATA:  Fall.  Hit back of head on window seal. EXAM: CT HEAD WITHOUT CONTRAST TECHNIQUE: Contiguous axial  images were obtained from the base of the skull through the vertex without intravenous contrast. COMPARISON:  07/22/2009 FINDINGS: Small bilateral lacunar infarcts are identified, chronic. No acute brain infarct. No abnormal extra-axial fluid collection, intracranial hemorrhage or mass. The paranasal sinuses and the mastoid air cells are clear. The calvarium is intact. IMPRESSION: 1. No acute intracranial abnormalities. 2. Chronic microvascular disease and brain atrophy. Electronically Signed   By: Kerby Moors M.D.   On: 09/02/2015 19:14    I personally reviewed and evaluated these imagesas a part of my medical decision-making.    EKG Interpretation None      MDM   Final diagnoses:  Closed head injury, initial encounter   I personally performed the services described in this documentation, which was scribed in my presence. The recorded information has been reviewed and is accurate.   Doubt any intracranial injury. Patient states the living assisted facility where she lives requires head CT's for any head injury.  CT head is without any acute abnormality. Discharged home with head injury precautions.  Julianne Rice, MD 09/04/15 0040

## 2015-09-02 NOTE — ED Notes (Signed)
Patient is alert and oriented x4.  She is not showing any symptoms or deficits at this time.   Patient states that she fell at hit her head but has no bleeding or deformity.  Patient is ambulatory with no alteration in gait but is complaining of small pain in her coccyx area after initial landing on it during the fall

## 2015-09-02 NOTE — Discharge Instructions (Signed)
Head Injury, Adult °You have a head injury. Headaches and throwing up (vomiting) are common after a head injury. It should be easy to wake up from sleeping. Sometimes you must stay in the hospital. Most problems happen within the first 24 hours. Side effects may occur up to 7-10 days after the injury.  °WHAT ARE THE TYPES OF HEAD INJURIES? °Head injuries can be as minor as a bump. Some head injuries can be more severe. More severe head injuries include: °· A jarring injury to the brain (concussion). °· A bruise of the brain (contusion). This mean there is bleeding in the brain that can cause swelling. °· A cracked skull (skull fracture). °· Bleeding in the brain that collects, clots, and forms a bump (hematoma). °WHEN SHOULD I GET HELP RIGHT AWAY?  °· You are confused or sleepy. °· You cannot be woken up. °· You feel sick to your stomach (nauseous) or keep throwing up (vomiting). °· Your dizziness or unsteadiness is getting worse. °· You have very bad, lasting headaches that are not helped by medicine. Take medicines only as told by your doctor. °· You cannot use your arms or legs like normal. °· You cannot walk. °· You notice changes in the black spots in the center of the colored part of your eye (pupil). °· You have clear or bloody fluid coming from your nose or ears. °· You have trouble seeing. °During the next 24 hours after the injury, you must stay with someone who can watch you. This person should get help right away (call 911 in the [place] if you start to shake and are not able to control it (have seizures), you pass out, or you are unable to wake up. °HOW CAN I PREVENT A HEAD INJURY IN THE FUTURE? °· Wear seat belts. °· Wear a helmet while bike riding and playing sports like football. °· Stay away from dangerous activities around the house. °WHEN CAN I RETURN TO NORMAL ACTIVITIES AND ATHLETICS? °See your doctor before doing these activities. You should not do normal activities or play contact sports until 1  week after the following symptoms have stopped: °· Headache that does not go away. °· Dizziness. °· Poor attention. °· Confusion. °· Memory problems. °· Sickness to your stomach or throwing up. °· Tiredness. °· Fussiness. °· Bothered by bright lights or loud noises. °· Anxiousness or depression. °· Restless sleep. °MAKE SURE YOU:  °· Understand these instructions. °· Will watch your condition. °· Will get help right away if you are not doing well or get worse. °  °This information is not intended to replace advice given to you by your health care provider. Make sure you discuss any questions you have with your health care provider. °  °Document Released: 08/06/2008 Document Revised: 09/14/2014 Document Reviewed: 05/01/2013 °Elsevier Interactive Patient Education ©2016 [employer] ° °

## 2015-09-02 NOTE — ED Notes (Signed)
Pt c/o fall from standing landing on carpet , hitting head on window seal. NO LOC

## 2015-09-02 NOTE — ED Notes (Signed)
Patient is alert and oriented x3.  She was given DC instructions and follow up visit instructions.  Patient gave verbal understanding. She was DC ambulatory under her own power to home.  V/S stable.  He was not showing any signs of distress on DC 

## 2015-09-17 ENCOUNTER — Ambulatory Visit: Payer: Self-pay | Admitting: Orthopedic Surgery

## 2015-09-17 NOTE — Progress Notes (Signed)
Preoperative surgical orders have been place into the Epic hospital system for [facility] on 09/17/2015, 9:03 PM  by Mickel Crow for surgery on 10/02/15.  Preop Knee Scope orders including IV Tylenol and IV Decadron as long as there are no contraindications to the above medications. Arlee Muslim, PA-C

## 2015-09-24 ENCOUNTER — Encounter (HOSPITAL_BASED_OUTPATIENT_CLINIC_OR_DEPARTMENT_OTHER): Payer: Self-pay | Admitting: *Deleted

## 2015-09-24 NOTE — Progress Notes (Signed)
NPO AFTER MN.  ARRIVE AT 0700.  NEEDS ISTAT.  CURRENT EKG IN CHART AND EPIC. WILL TAKE PRILOSEC AND PROZAC AM DOS W/ SIPS OF WATER.

## 2015-10-01 DIAGNOSIS — M25869 Other specified joint disorders, unspecified knee: Secondary | ICD-10-CM

## 2015-10-01 DIAGNOSIS — Z96659 Presence of unspecified artificial knee joint: Secondary | ICD-10-CM

## 2015-10-01 NOTE — H&P (Signed)
CC- Marie Harrison is a 75 y.o. female who presents with right knee pain.  HPI- . Knee Pain: Patient presents with knee pain involving the  right knee. Onset of the symptoms was several months ago. Inciting event: She had a right total knee arthroplasty and has developed painful popping in the knee consistent with patellar clunk syndrome. Current symptoms include crepitus sensation and foreign body sensation. Pain is aggravated by going up and down stairs and rising after sitting.  Patient has had prior knee problems.   Past Medical History  Diagnosis Date  . Depression   . GERD (gastroesophageal reflux disease)   . Hyperlipidemia   . Hypertension   . Arthritis   . PONV (postoperative nausea and vomiting)   . Anxiety   . History of CVA (cerebrovascular accident)     Remote CVA per ct 2009    . Allergic rhinitis   . SUI (stress urinary incontinence, female)   . History of basal cell carcinoma excision     x6  face/ head  . Patellar clunk syndrome of right knee   . History of recurrent UTI (urinary tract infection)   . Wears partial dentures     upper and lower  . IBS (irritable bowel syndrome)     Past Surgical History  Procedure Laterality Date  . Knee arthroscopy Right 12/07/2012    Procedure: RIGHT KNEE ARTHROSCOPY WITH DEBRIDEMENT AND PARTIAL MEDIAL MENISECTOMY  AND CHONDROPLASTY;  Surgeon: Gearlean Alf, MD;  Location: [facility];  Service: Orthopedics;  Laterality: Right;  . Knee arthroscopy Left 2001  . Total knee arthroplasty Right 04/30/2014    Procedure: RIGHT TOTAL KNEE ARTHROPLASTY;  Surgeon: Gearlean Alf, MD;  Location: [facility];  Service: Orthopedics;  Laterality: Right;  . Dilatation & currettage/hysteroscopy with resectocope N/A 11/28/2014    Procedure: DILATATION & CURETTAGE/HYSTEROSCOPY WITH RESECTOCOPE;  Surgeon: Servando Salina, MD;  Location: [facility];  Service: Gynecology;  Laterality: N/A;  . Tonsillectomy  child  . Foot surgery Right 1955    removal of bone  spur  . Cataract extraction w/ intraocular lens  implant, bilateral  2001  &  1999    Prior to Admission medications   Medication Sig Start Date End Date Taking? Authorizing Provider  acetaminophen (TYLENOL) 325 MG tablet Take 325-650 mg by mouth every 6 (six) hours as needed (Pain).   Yes Historical Provider, MD  aspirin EC 81 MG tablet Take 81 mg by mouth daily.   Yes Historical Provider, MD  FLUoxetine (PROZAC) 20 MG capsule Take 1 capsule (20 mg total) by mouth every morning. 04/17/14  Yes Dorena Cookey, MD  hydrochlorothiazide (HYDRODIURIL) 25 MG tablet Take 1 tablet (25 mg total) by mouth every morning. 04/17/14  Yes Dorena Cookey, MD  lisinopril (PRINIVIL,ZESTRIL) 40 MG tablet Take 80 mg by mouth every morning.   Yes Historical Provider, MD  loratadine (CLARITIN) 10 MG tablet Take 10 mg by mouth daily as needed for allergies.    Yes Historical Provider, MD  LORazepam (ATIVAN) 0.5 MG tablet TAKE 0.5 TABLET BY MOUTH AT BEDTIME AS NEEDED FOR SLEEP 11/22/14  Yes Dorena Cookey, MD  lovastatin (MEVACOR) 40 MG tablet Take 1 tablet (40 mg total) by mouth daily with supper. 04/17/14  Yes Dorena Cookey, MD  nitrofurantoin, macrocrystal-monohydrate, (MACROBID) 100 MG capsule 1 tab po bid x 3d, then 1 tab po qd Patient taking differently: Take 100 mg by mouth every morning. prophylaxis 09/08/14  Yes Adrian Blackwater McGowen,  MD  omeprazole (PRILOSEC) 40 MG capsule Take 40 mg by mouth daily as needed (Acid reflux).   Yes Historical Provider, MD  potassium chloride (K-DUR,KLOR-CON) 10 MEQ tablet Take 1 tablet (10 mEq total) by mouth 2 (two) times daily. 04/17/14  Yes Dorena Cookey, MD  triamcinolone cream (KENALOG) 0.1 % Apply 1 application topically 2 (two) times daily as needed (Itching).   Yes Historical Provider, MD   KNEE EXAM antalgic gait, soft tissue tenderness over anterior knee, no effusion, collateral ligaments intact, crepitus on ROM with range 0-125  Physical Examination: General appearance -  alert, well appearing, and in no distress Mental status - alert, oriented to person, place, and time Chest - clear to auscultation, no wheezes, rales or rhonchi, symmetric air entry Heart - normal rate, regular rhythm, normal S1, S2, no murmurs, rubs, clicks or gallops Abdomen - soft, nontender, nondistended, no masses or organomegaly Neurological - alert, oriented, normal speech, no focal findings or movement disorder noted   Asessment/Plan--- Right knee patellar clunk syndrome- - Plan right knee arthroscopy with synovectomy. Procedure risks and potential comps discussed with patient who elects to proceed. Goals are decreased pain and increased function with a high likelihood of achieving both

## 2015-10-02 ENCOUNTER — Ambulatory Visit (HOSPITAL_BASED_OUTPATIENT_CLINIC_OR_DEPARTMENT_OTHER): Payer: Medicare Other | Admitting: Anesthesiology

## 2015-10-02 ENCOUNTER — Encounter (HOSPITAL_BASED_OUTPATIENT_CLINIC_OR_DEPARTMENT_OTHER): Payer: Self-pay | Admitting: *Deleted

## 2015-10-02 ENCOUNTER — Ambulatory Visit (HOSPITAL_BASED_OUTPATIENT_CLINIC_OR_DEPARTMENT_OTHER)
Admission: RE | Admit: 2015-10-02 | Discharge: 2015-10-02 | Disposition: A | Discharge status: Home / Self Care | Payer: Medicare Other | Source: Ambulatory Visit | Attending: Orthopedic Surgery | Admitting: Orthopedic Surgery

## 2015-10-02 ENCOUNTER — Encounter (HOSPITAL_BASED_OUTPATIENT_CLINIC_OR_DEPARTMENT_OTHER)
Admission: RE | Disposition: A | Discharge status: Home / Self Care | Payer: Self-pay | Source: Ambulatory Visit | Attending: Orthopedic Surgery

## 2015-10-02 DIAGNOSIS — K219 Gastro-esophageal reflux disease without esophagitis: Secondary | ICD-10-CM | POA: Insufficient documentation

## 2015-10-02 DIAGNOSIS — M25869 Other specified joint disorders, unspecified knee: Secondary | ICD-10-CM

## 2015-10-02 DIAGNOSIS — M1990 Unspecified osteoarthritis, unspecified site: Secondary | ICD-10-CM | POA: Insufficient documentation

## 2015-10-02 DIAGNOSIS — M25861 Other specified joint disorders, right knee: Secondary | ICD-10-CM | POA: Diagnosis not present

## 2015-10-02 DIAGNOSIS — F419 Anxiety disorder, unspecified: Secondary | ICD-10-CM | POA: Diagnosis not present

## 2015-10-02 DIAGNOSIS — E785 Hyperlipidemia, unspecified: Secondary | ICD-10-CM | POA: Diagnosis not present

## 2015-10-02 DIAGNOSIS — I10 Essential (primary) hypertension: Secondary | ICD-10-CM | POA: Insufficient documentation

## 2015-10-02 DIAGNOSIS — K589 Irritable bowel syndrome without diarrhea: Secondary | ICD-10-CM | POA: Insufficient documentation

## 2015-10-02 DIAGNOSIS — Z87891 Personal history of nicotine dependence: Secondary | ICD-10-CM | POA: Insufficient documentation

## 2015-10-02 DIAGNOSIS — Z79899 Other long term (current) drug therapy: Secondary | ICD-10-CM | POA: Diagnosis not present

## 2015-10-02 DIAGNOSIS — Z8673 Personal history of transient ischemic attack (TIA), and cerebral infarction without residual deficits: Secondary | ICD-10-CM | POA: Insufficient documentation

## 2015-10-02 DIAGNOSIS — Z85828 Personal history of other malignant neoplasm of skin: Secondary | ICD-10-CM | POA: Insufficient documentation

## 2015-10-02 DIAGNOSIS — Z7982 Long term (current) use of aspirin: Secondary | ICD-10-CM | POA: Insufficient documentation

## 2015-10-02 DIAGNOSIS — Z96659 Presence of unspecified artificial knee joint: Secondary | ICD-10-CM

## 2015-10-02 DIAGNOSIS — J45909 Unspecified asthma, uncomplicated: Secondary | ICD-10-CM | POA: Insufficient documentation

## 2015-10-02 DIAGNOSIS — F329 Major depressive disorder, single episode, unspecified: Secondary | ICD-10-CM | POA: Diagnosis not present

## 2015-10-02 HISTORY — PX: KNEE ARTHROSCOPY: SHX127

## 2015-10-02 HISTORY — DX: Allergic rhinitis, unspecified: J30.9

## 2015-10-02 HISTORY — DX: Stress incontinence (female) (male): N39.3

## 2015-10-02 HISTORY — DX: Presence of dental prosthetic device (complete) (partial): Z97.2

## 2015-10-02 HISTORY — DX: Other specified joint disorders, right knee: M25.861

## 2015-10-02 HISTORY — DX: Other specified postprocedural states: Z85.828

## 2015-10-02 HISTORY — DX: Personal history of urinary (tract) infections: Z87.440

## 2015-10-02 HISTORY — DX: Personal history of other malignant neoplasm of skin: Z98.890

## 2015-10-02 HISTORY — DX: Irritable bowel syndrome, unspecified: K58.9

## 2015-10-02 HISTORY — DX: Personal history of transient ischemic attack (TIA), and cerebral infarction without residual deficits: Z86.73

## 2015-10-02 LAB — POCT I-STAT 4, (NA,K, GLUC, HGB,HCT)
GLUCOSE: 94 mg/dL (ref 65–99)
HCT: 41 % (ref 36.0–46.0)
HEMOGLOBIN: 13.9 g/dL (ref 12.0–15.0)
POTASSIUM: 3.8 mmol/L (ref 3.5–5.1)
Sodium: 139 mmol/L (ref 135–145)

## 2015-10-02 SURGERY — ARTHROSCOPY, KNEE
Anesthesia: General | Site: Knee | Laterality: Right

## 2015-10-02 MED ORDER — EPHEDRINE SULFATE 50 MG/ML IJ SOLN
INTRAMUSCULAR | Status: DC | PRN
  Administered 2015-10-02: 15 mg via INTRAVENOUS
  Administered 2015-10-02: 10 mg via INTRAVENOUS

## 2015-10-02 MED ORDER — DEXAMETHASONE SODIUM PHOSPHATE 10 MG/ML IJ SOLN
10.0000 mg | Freq: Once | INTRAMUSCULAR | Status: DC
  Filled 2015-10-02: qty 1

## 2015-10-02 MED ORDER — KETOROLAC TROMETHAMINE 30 MG/ML IJ SOLN
INTRAMUSCULAR | Status: DC | PRN
  Administered 2015-10-02: 30 mg via INTRAVENOUS

## 2015-10-02 MED ORDER — MIDAZOLAM HCL 2 MG/2ML IJ SOLN
INTRAMUSCULAR | Status: AC
  Filled 2015-10-02: qty 2

## 2015-10-02 MED ORDER — KETOROLAC TROMETHAMINE 30 MG/ML IJ SOLN
INTRAMUSCULAR | Status: AC
  Filled 2015-10-02: qty 1

## 2015-10-02 MED ORDER — TRAMADOL HCL 50 MG PO TABS
50.0000 mg | ORAL_TABLET | Freq: Four times a day (QID) | ORAL | Status: DC | PRN
Start: 2015-10-02 — End: 2015-12-31

## 2015-10-02 MED ORDER — ONDANSETRON HCL 4 MG/2ML IJ SOLN
INTRAMUSCULAR | Status: DC | PRN
  Administered 2015-10-02: 4 mg via INTRAVENOUS

## 2015-10-02 MED ORDER — FENTANYL CITRATE (PF) 100 MCG/2ML IJ SOLN
INTRAMUSCULAR | Status: AC
  Filled 2015-10-02: qty 2

## 2015-10-02 MED ORDER — SODIUM CHLORIDE 0.9 % IR SOLN
Status: DC | PRN
  Administered 2015-10-02: 7000 mL

## 2015-10-02 MED ORDER — LACTATED RINGERS IV SOLN
INTRAVENOUS | Status: DC
  Administered 2015-10-02 (×2): via INTRAVENOUS
  Filled 2015-10-02: qty 1000

## 2015-10-02 MED ORDER — ONDANSETRON HCL 4 MG/2ML IJ SOLN
INTRAMUSCULAR | Status: AC
  Filled 2015-10-02: qty 2

## 2015-10-02 MED ORDER — CHLORHEXIDINE GLUCONATE 4 % EX LIQD
60.0000 mL | Freq: Once | CUTANEOUS | Status: DC
  Filled 2015-10-02: qty 60

## 2015-10-02 MED ORDER — ACETAMINOPHEN 500 MG PO TABS
ORAL_TABLET | ORAL | Status: AC
  Filled 2015-10-02: qty 2

## 2015-10-02 MED ORDER — SODIUM CHLORIDE 0.9 % IV SOLN
INTRAVENOUS | Status: DC
  Filled 2015-10-02: qty 1000

## 2015-10-02 MED ORDER — ACETAMINOPHEN 500 MG PO TABS
1000.0000 mg | ORAL_TABLET | Freq: Once | ORAL | Status: AC
  Administered 2015-10-02: 1000 mg via ORAL
  Filled 2015-10-02: qty 2

## 2015-10-02 MED ORDER — ACETAMINOPHEN 10 MG/ML IV SOLN
1000.0000 mg | Freq: Once | INTRAVENOUS | Status: DC
  Filled 2015-10-02: qty 100

## 2015-10-02 MED ORDER — PROPOFOL 10 MG/ML IV BOLUS
INTRAVENOUS | Status: DC | PRN
  Administered 2015-10-02: 170 mg via INTRAVENOUS

## 2015-10-02 MED ORDER — LIDOCAINE HCL (CARDIAC) 20 MG/ML IV SOLN
INTRAVENOUS | Status: DC | PRN
  Administered 2015-10-02: 50 mg via INTRAVENOUS

## 2015-10-02 MED ORDER — PROPOFOL 500 MG/50ML IV EMUL
INTRAVENOUS | Status: AC
  Filled 2015-10-02: qty 50

## 2015-10-02 MED ORDER — FENTANYL CITRATE (PF) 100 MCG/2ML IJ SOLN
25.0000 ug | INTRAMUSCULAR | Status: DC | PRN
  Administered 2015-10-02: 25 ug via INTRAVENOUS
  Filled 2015-10-02: qty 1

## 2015-10-02 MED ORDER — METHOCARBAMOL 500 MG PO TABS: 500.0000 mg | ORAL_TABLET | Freq: Four times a day (QID) | ORAL | Status: DC

## 2015-10-02 MED ORDER — DEXAMETHASONE SODIUM PHOSPHATE 4 MG/ML IJ SOLN
INTRAMUSCULAR | Status: DC | PRN
  Administered 2015-10-02: 10 mg via INTRAVENOUS

## 2015-10-02 MED ORDER — TRAMADOL HCL 50 MG PO TABS
50.0000 mg | ORAL_TABLET | Freq: Four times a day (QID) | ORAL | Status: DC | PRN
  Administered 2015-10-02: 50 mg via ORAL
  Filled 2015-10-02: qty 1

## 2015-10-02 MED ORDER — ARTIFICIAL TEARS OP OINT
TOPICAL_OINTMENT | OPHTHALMIC | Status: AC
  Filled 2015-10-02: qty 3.5

## 2015-10-02 MED ORDER — MIDAZOLAM HCL 5 MG/5ML IJ SOLN
INTRAMUSCULAR | Status: DC | PRN
  Administered 2015-10-02: 2 mg via INTRAVENOUS

## 2015-10-02 MED ORDER — PROMETHAZINE HCL 25 MG/ML IJ SOLN
6.2500 mg | INTRAMUSCULAR | Status: DC | PRN
  Filled 2015-10-02: qty 1

## 2015-10-02 MED ORDER — BUPIVACAINE-EPINEPHRINE 0.25% -1:200000 IJ SOLN
INTRAMUSCULAR | Status: DC | PRN
  Administered 2015-10-02: 20 mL

## 2015-10-02 MED ORDER — CEFAZOLIN SODIUM-DEXTROSE 2-3 GM-% IV SOLR
INTRAVENOUS | Status: AC
  Filled 2015-10-02: qty 50

## 2015-10-02 MED ORDER — CEFAZOLIN SODIUM-DEXTROSE 2-3 GM-% IV SOLR
2.0000 g | INTRAVENOUS | Status: AC
  Administered 2015-10-02: 2 g via INTRAVENOUS
  Filled 2015-10-02: qty 50

## 2015-10-02 MED ORDER — TRAMADOL HCL 50 MG PO TABS
ORAL_TABLET | ORAL | Status: AC
  Filled 2015-10-02: qty 1

## 2015-10-02 MED ORDER — FENTANYL CITRATE (PF) 100 MCG/2ML IJ SOLN
INTRAMUSCULAR | Status: DC | PRN
  Administered 2015-10-02: 50 ug via INTRAVENOUS
  Administered 2015-10-02 (×2): 25 ug via INTRAVENOUS

## 2015-10-02 MED ORDER — DEXAMETHASONE SODIUM PHOSPHATE 10 MG/ML IJ SOLN
INTRAMUSCULAR | Status: AC
  Filled 2015-10-02: qty 1

## 2015-10-02 MED ORDER — NITROFURANTOIN MONOHYD MACRO 100 MG PO CAPS: 100.0000 mg | ORAL_CAPSULE | Freq: Every morning | ORAL | Status: DC

## 2015-10-02 SURGICAL SUPPLY — 48 items
BANDAGE ELASTIC 6 VELCRO ST LF (GAUZE/BANDAGES/DRESSINGS) ×2 IMPLANT
BLADE 4.2CUDA (BLADE) ×2 IMPLANT
BLADE CUDA SHAVER 3.5 (BLADE) IMPLANT
BLADE CUTTER GATOR 3.5 (BLADE) IMPLANT
CANISTER SUCT LVC 12 LTR MEDI- (MISCELLANEOUS) ×2 IMPLANT
CANISTER SUCTION 2500CC (MISCELLANEOUS) IMPLANT
CLOTH BEACON ORANGE TIMEOUT ST (SAFETY) ×2 IMPLANT
CUFF TOURN SGL QUICK 34 (TOURNIQUET CUFF) ×2
CUFF TRNQT CYL 34X4X40X1 (TOURNIQUET CUFF) ×1 IMPLANT
DRAPE ARTHROSCOPY W/POUCH 114 (DRAPES) ×2 IMPLANT
DRAPE U-SHAPE 47X51 STRL (DRAPES) ×2 IMPLANT
DRSG EMULSION OIL 3X3 NADH (GAUZE/BANDAGES/DRESSINGS) ×2 IMPLANT
DRSG PAD ABDOMINAL 8X10 ST (GAUZE/BANDAGES/DRESSINGS) ×1 IMPLANT
DURAPREP 26ML APPLICATOR (WOUND CARE) ×2 IMPLANT
ELECT MENISCUS 165MM 90D (ELECTRODE) IMPLANT
ELECT REM PT RETURN 9FT ADLT (ELECTROSURGICAL)
ELECTRODE REM PT RTRN 9FT ADLT (ELECTROSURGICAL) IMPLANT
GLOVE BIO SURGEON STRL SZ 6.5 (GLOVE) ×1 IMPLANT
GLOVE BIO SURGEON STRL SZ8 (GLOVE) ×2 IMPLANT
GLOVE BIOGEL PI IND STRL 6.5 (GLOVE) IMPLANT
GLOVE BIOGEL PI IND STRL 7.5 (GLOVE) IMPLANT
GLOVE BIOGEL PI INDICATOR 6.5 (GLOVE) ×1
GLOVE BIOGEL PI INDICATOR 7.5 (GLOVE) ×1
GLOVE INDICATOR 8.0 STRL GRN (GLOVE) ×2 IMPLANT
GOWN STRL REUS W/ TWL LRG LVL3 (GOWN DISPOSABLE) ×2 IMPLANT
GOWN STRL REUS W/TWL LRG LVL3 (GOWN DISPOSABLE) ×4
IV NS 1000ML (IV SOLUTION) ×2
IV NS 1000ML BAXH (IV SOLUTION) IMPLANT
IV NS IRRIG 3000ML ARTHROMATIC (IV SOLUTION) ×3 IMPLANT
KIT ROOM TURNOVER WOR (KITS) ×2 IMPLANT
KNEE WRAP E Z 3 GEL PACK (MISCELLANEOUS) ×2 IMPLANT
MANIFOLD NEPTUNE II (INSTRUMENTS) ×1 IMPLANT
PACK ARTHROSCOPY DSU (CUSTOM PROCEDURE TRAY) ×2 IMPLANT
PACK BASIN DAY SURGERY FS (CUSTOM PROCEDURE TRAY) ×2 IMPLANT
PADDING CAST ABS 4INX4YD NS (CAST SUPPLIES) ×1
PADDING CAST ABS COTTON 4X4 ST (CAST SUPPLIES) ×1 IMPLANT
PADDING CAST COTTON 6X4 STRL (CAST SUPPLIES) ×2 IMPLANT
PENCIL BUTTON HOLSTER BLD 10FT (ELECTRODE) IMPLANT
SET ARTHROSCOPY TUBING (MISCELLANEOUS) ×2
SET ARTHROSCOPY TUBING LN (MISCELLANEOUS) ×1 IMPLANT
SPONGE GAUZE 4X4 12PLY (GAUZE/BANDAGES/DRESSINGS) ×2 IMPLANT
SPONGE GAUZE 4X4 12PLY STER LF (GAUZE/BANDAGES/DRESSINGS) ×1 IMPLANT
SUT ETHILON 4 0 PS 2 18 (SUTURE) ×2 IMPLANT
TOWEL OR 17X24 6PK STRL BLUE (TOWEL DISPOSABLE) ×2 IMPLANT
TUBE CONNECTING 12X1/4 (SUCTIONS) IMPLANT
WAND 30 DEG SABER W/CORD (SURGICAL WAND) IMPLANT
WAND 90 DEG TURBOVAC W/CORD (SURGICAL WAND) IMPLANT
WATER STERILE IRR 500ML POUR (IV SOLUTION) ×2 IMPLANT

## 2015-10-02 NOTE — Transfer of Care (Signed)
Immediate Anesthesia Transfer of Care Note  Patient: Marie Harrison  Procedure(s) Performed: Procedure(s): ARTHROSCOPY RIGHT KNEE WITH SYNOVECTOMY (Right)  Patient Location: PACU  Anesthesia Type:General  Level of Consciousness: awake, alert , oriented and patient cooperative  Airway & Oxygen Therapy: Patient Spontanous Breathing and Patient connected to nasal cannula oxygen  Post-op Assessment: Report given to RN and Post -op Vital signs reviewed and stable  Post vital signs: Reviewed and stable  Last Vitals:  Filed Vitals:   10/02/15 0723  BP: 144/61  Pulse: 70  Temp: 36.8 C  Resp: 16    Complications: No apparent anesthesia complications

## 2015-10-02 NOTE — Anesthesia Procedure Notes (Signed)
Procedure Name: LMA Insertion Date/Time: 10/02/2015 8:50 AM Performed by: Wanita Chamberlain Pre-anesthesia Checklist: Patient identified, Timeout performed, Emergency Drugs available, Suction available and Patient being monitored Patient Re-evaluated:Patient Re-evaluated prior to inductionOxygen Delivery Method: Circle system utilized Preoxygenation: Pre-oxygenation with 100% oxygen Intubation Type: IV induction Ventilation: Mask ventilation without difficulty LMA: LMA inserted LMA Size: 4.0 Number of attempts: 1 Airway Equipment and Method: Bite block Placement Confirmation: positive ETCO2 and breath sounds checked- equal and bilateral Tube secured with: Tape Dental Injury: Teeth and Oropharynx as per pre-operative assessment

## 2015-10-02 NOTE — Interval H&P Note (Signed)
History and Physical Interval Note:  10/02/2015 8:40 AM  Marie Harrison  has presented today for surgery, with the diagnosis of right knee patella clunk syndrome  The various methods of treatment have been discussed with the patient and family. After consideration of risks, benefits and other options for treatment, the patient has consented to  Procedure(s): ARTHROSCOPY RIGHT KNEE WITH SYNOVECTOMY (Right) as a surgical intervention .  The patient's history has been reviewed, patient examined, no change in status, stable for surgery.  I have reviewed the patient's chart and labs.  Questions were answered to the patient's satisfaction.     Gearlean Alf

## 2015-10-02 NOTE — Brief Op Note (Signed)
10/02/2015  9:31 AM  PATIENT:  Marie Harrison  75 y.o. female  PRE-OPERATIVE DIAGNOSIS:  right knee patella clunk syndrome  POST-OPERATIVE DIAGNOSIS:  right knee patella clunk syndrome  PROCEDURE:  Procedure(s): ARTHROSCOPY RIGHT KNEE WITH SYNOVECTOMY (Right)  SURGEON:  Surgeon(s) and Role:    * Gaynelle Arabian, MD - Primary  PHYSICIAN ASSISTANT:   ASSISTANTS: none   ANESTHESIA:   general  EBL:  Total I/O In: 600 [I.V.:600] Out: -   BLOOD ADMINISTERED:none  DRAINS: none   LOCAL MEDICATIONS USED:  MARCAINE     COUNTS:  YES  TOURNIQUET:    DICTATION: .Other Dictation: Dictation Number [id number]  PLAN OF CARE: Discharge to home after PACU  PATIENT DISPOSITION:  PACU - hemodynamically stable.

## 2015-10-02 NOTE — Anesthesia Preprocedure Evaluation (Addendum)
Anesthesia Evaluation  Patient identified by MRN, date of birth, ID band Patient awake    Reviewed: Allergy & Precautions, NPO status , Patient's Chart, lab work & pertinent test results  History of Anesthesia Complications (+) PONV and history of anesthetic complications  Airway Mallampati: II  TM Distance: >3 FB Neck ROM: Full    Dental no notable dental hx. (+) Caps, Partial Upper, Partial Lower, Dental Advisory Given,    Pulmonary asthma , former smoker,    Pulmonary exam normal breath sounds clear to auscultation       Cardiovascular hypertension, Pt. on medications Normal cardiovascular exam Rhythm:Regular Rate:Normal     Neuro/Psych PSYCHIATRIC DISORDERS Anxiety Depression CVA    GI/Hepatic Neg liver ROS, GERD  Medicated,  Endo/Other  negative endocrine ROS  Renal/GU negative Renal ROS  negative genitourinary   Musculoskeletal  (+) Arthritis ,   Abdominal (+) + obese,   Peds negative pediatric ROS (+)  Hematology negative hematology ROS (+)   Anesthesia Other Findings   Reproductive/Obstetrics negative OB ROS                          Anesthesia Physical Anesthesia Plan  ASA: III  Anesthesia Plan: General   Post-op Pain Management:    Induction: Intravenous  Airway Management Planned: LMA  Additional Equipment:   Intra-op Plan:   Post-operative Plan: Extubation in OR  Informed Consent: I have reviewed the patients History and Physical, chart, labs and discussed the procedure including the risks, benefits and alternatives for the proposed anesthesia with the patient or authorized representative who has indicated his/her understanding and acceptance.   Dental advisory given  Plan Discussed with: CRNA and Anesthesiologist  Anesthesia Plan Comments:        Anesthesia Quick Evaluation

## 2015-10-02 NOTE — Discharge Instructions (Signed)
° °Dr. Frank Aluisio °Total Joint Specialist °[facility] °[address] °[place], [place] [address] °[phone] ° ° °Arthroscopic Procedure, Knee °An arthroscopic procedure can find what is wrong with your knee. °PROCEDURE °Arthroscopy is a surgical technique that allows your orthopedic surgeon to diagnose and treat your knee injury with accuracy. They will look into your knee through a small instrument. This is almost like a small (pencil sized) telescope. Because arthroscopy affects your knee less than open knee surgery, you can anticipate a more rapid recovery. Taking an active role by following your caregiver's instructions will help with rapid and complete recovery. Use crutches, rest, elevation, ice, and knee exercises as instructed. The length of recovery depends on various factors including type of injury, age, physical condition, medical conditions, and your rehabilitation. °Your knee is the joint between the large bones (femur and tibia) in your leg. Cartilage covers these bone ends which are smooth and slippery and allow your knee to bend and move smoothly. Two menisci, thick, semi-lunar shaped pads of cartilage which form a rim inside the joint, help absorb shock and stabilize your knee. Ligaments bind the bones together and support your knee joint. Muscles move the joint, help support your knee, and take stress off the joint itself. Because of this all programs and physical therapy to rehabilitate an injured or repaired knee require rebuilding and strengthening your muscles. °AFTER THE PROCEDURE °· After the procedure, you will be moved to a recovery area until most of the effects of the medication have worn off. Your caregiver will discuss the test results with you.  °· Only take over-the-counter or prescription medicines for pain, discomfort, or fever as directed by your caregiver.  °SEEK MEDICAL CARE IF:  °· You have increased bleeding from your wounds.  °· You see  redness, swelling, or have increasing pain in your wounds.  °· You have pus coming from your wound.  °· You have an oral temperature above 102° F (38.9° C).  °· You notice a bad smell coming from the wound or dressing.  °· You have severe pain with any motion of your knee.  °SEEK IMMEDIATE MEDICAL CARE IF:  °· You develop a rash.  °· You have difficulty breathing.  °· You have any allergic problems.  °FURTHER INSTRUCTIONS:  °· ICE to the affected knee every three hours for 30 minutes at a time and then as needed for pain and swelling.  Continue to use ice on the knee for pain and swelling from surgery. You may notice swelling that will progress down to the foot and ankle.  This is normal after surgery.  Elevate the leg when you are not up walking on it.   ° °DIET °You may resume your previous home diet once your are discharged from the hospital. ° °DRESSING / WOUND CARE / SHOWERING °You may start showering two days after being discharged home but do not submerge the incisions under water.  °Change dressing 48 hours after the procedure and then cover the small incisions with band aids until your follow up visit. °Change the surgical dressings daily and reapply a dry dressing each time.  ° °ACTIVITY °Walk with your walker as instructed. °Use walker as long as suggested by your caregivers. °Avoid periods of inactivity such as sitting longer than an hour when not asleep. This helps prevent blood clots.  °You may resume a sexual relationship in one month or when given the OK by your doctor.  °You may return to   work once you are cleared by your doctor.  °Do not drive a car for 6 weeks or until released by you surgeon.  °Do not drive while taking narcotics. ° °WEIGHT BEARING AS TOLERATED ° °POSTOPERATIVE CONSTIPATION PROTOCOL °Constipation - defined medically as fewer than three stools per week and severe constipation as less than one stool per week. ° °One of the most common issues patients have following surgery is  constipation.  Even if you have a regular bowel pattern at home, your normal regimen is likely to be disrupted due to multiple reasons following surgery.  Combination of anesthesia, postoperative narcotics, change in appetite and fluid intake all can affect your bowels.  In order to avoid complications following surgery, here are some recommendations in order to help you during your recovery period. ° °Colace (docusate) - Pick up an over-the-counter form of Colace or another stool softener and take twice a day as long as you are requiring postoperative pain medications.  Take with a full glass of water daily.  If you experience loose stools or diarrhea, hold the colace until you stool forms back up.  If your symptoms do not get better within 1 week or if they get worse, check with your doctor. ° °Dulcolax (bisacodyl) - Pick up over-the-counter and take as directed by the product packaging as needed to assist with the movement of your bowels.  Take with a full glass of water.  Use this product as needed if not relieved by Colace only.  ° °MiraLax (polyethylene glycol) - Pick up over-the-counter to have on hand.  MiraLax is a solution that will increase the amount of water in your bowels to assist with bowel movements.  Take as directed and can mix with a glass of water, juice, soda, coffee, or tea.  Take if you go more than two days without a movement. °Do not use MiraLax more than once per day. Call your doctor if you are still constipated or irregular after using this medication for 7 days in a row. ° °If you continue to have problems with postoperative constipation, please contact the office for further assistance and recommendations.  If you experience "the worst abdominal pain ever" or develop nausea or vomiting, please contact the office immediatly for further recommendations for treatment. ° °ITCHING ° If you experience itching with your medications, try taking only a single pain pill, or even half a pain pill  at a time.  You can also use Benadryl over the counter for itching or also to help with sleep.  ° °TED HOSE STOCKINGS °Wear the elastic stockings on both legs for three weeks following surgery during the day but you may remove then at night for sleeping. ° °MEDICATIONS °See your medication summary on the “After Visit Summary” that the nursing staff will review with you prior to discharge.  You may have some home medications which will be placed on hold until you complete the course of blood thinner medication.  It is important for you to complete the blood thinner medication as prescribed by your surgeon.  Continue your approved medications as instructed at time of discharge. °Do not drive while taking narcotics.  ° °PRECAUTIONS °If you experience chest pain or shortness of breath - call 911 immediately for transfer to the hospital emergency department.  °If you develop a fever greater that 101 F, purulent drainage from wound, increased redness or drainage from wound, foul odor from the wound/dressing, or calf pain - CONTACT YOUR SURGEON.   °                                                °  FOLLOW-UP APPOINTMENTS °Make sure you keep all of your appointments after your operation with your surgeon and caregivers. You should call the office at [phone]  and make an appointment for approximately one week after the date of your surgery or on the date instructed by your surgeon outlined in the "After Visit Summary". ° °RANGE OF MOTION AND STRENGTHENING EXERCISES  °Rehabilitation of the knee is important following a knee injury or an operation. After just a few days of immobilization, the muscles of the thigh which control the knee become weakened and shrink (atrophy). Knee exercises are designed to build up the tone and strength of the thigh muscles and to improve knee motion. Often times heat used for twenty to thirty minutes before working out will loosen up your tissues and help with improving the range of motion  but do not use heat for the first two weeks following surgery. These exercises can be done on a training (exercise) mat, on the floor, on a table or on a bed. Use what ever works the best and is most comfortable for you Knee exercises include: ° °QUAD STRENGTHENING EXERCISES °Strengthening Quadriceps Sets ° °Tighten muscles on top of thigh by pushing knees down into floor or table. °Hold for 20 seconds. Repeat 10 times. °Do 2 sessions per day. ° ° ° ° °Strengthening Terminal Knee Extension ° °With knee bent over bolster, straighten knee by tightening muscle on top of thigh. Be sure to keep bottom of knee on bolster. °Hold for 20 seconds. Repeat 10 times. °Do 2 sessions per day. ° ° °Straight Leg with Bent Knee ° °Lie on back with opposite leg bent. Keep involved knee slightly bent at knee and raise leg 4-6". Hold for 10 seconds. °Repeat 20 times per set. °Do 2 sets per session. °Do 2 sessions per day. ° ° °Post Anesthesia Home Care Instructions ° °Activity: °Get plenty of rest for the remainder of the day. A responsible adult should stay with you for 24 hours following the procedure.  °For the next 24 hours, DO NOT: °-Drive a car °-Operate machinery °-Drink alcoholic beverages °-Take any medication unless instructed by your physician °-Make any legal decisions or sign important papers. ° °Meals: °Start with liquid foods such as gelatin or soup. Progress to regular foods as tolerated. Avoid greasy, spicy, heavy foods. If nausea and/or vomiting occur, drink only clear liquids until the nausea and/or vomiting subsides. Call your physician if vomiting continues. ° °Special Instructions/Symptoms: °Your throat may feel dry or sore from the anesthesia or the breathing tube placed in your throat during surgery. If this causes discomfort, gargle with warm salt water. The discomfort should disappear within 24 hours. ° °If you had a scopolamine patch placed behind your ear for the management of post- operative nausea and/or  vomiting: ° °1. The medication in the patch is effective for 72 hours, after which it should be removed.  Wrap patch in a tissue and discard in the trash. Wash hands thoroughly with soap and water. °2. You may remove the patch earlier than 72 hours if you experience unpleasant side effects which may include dry mouth, dizziness or visual disturbances. °3. Avoid touching the patch. Wash your hands with soap and water after contact with the patch. °  ° °

## 2015-10-02 NOTE — Op Note (Deleted)
Marie Harrison, Marie Harrison              ACCOUNT NO.:  [id number]  MEDICAL RECORD NO.:  [id number]  LOCATION:  [facility]                          FACILITY:  [facility]  PHYSICIAN:  Gaynelle Arabian, M.D.    DATE OF BIRTH:  August 28, 1941  DATE OF PROCEDURE:  10/02/2015 DATE OF DISCHARGE:  09/02/2015                              OPERATIVE REPORT   PREOPERATIVE DIAGNOSIS:  Right knee patellar clunk syndrome.  POSTOPERATIVE DIAGNOSIS:  Right knee patellar clunk syndrome.  PROCEDURE:  Right knee arthroscopy with synovectomy.  SURGEON:  Gaynelle Arabian, M.D.  ASSISTANT:  None.  ANESTHESIA:  General.  ESTIMATED BLOOD LOSS:  Minimal.  DRAINS:  None.  COMPLICATIONS:  None.  CONDITION:  Stable to recovery.  BRIEF CLINICAL NOTE:  [name] is a 75 year old female who had a total knee arthroplasty a few years ago.  She has had recently developed painful popping consistent with patellar clunk syndrome.  She presents now for arthroscopy and synovectomy.  PROCEDURE IN DETAIL:  After successful administration of general anesthetic, a tourniquet was placed high on the right thigh and her right lower extremity, prepped and draped in the usual sterile fashion. Standard superomedial and inferolateral incisions were made.  Inflow cannula passed, superomedial.  Camera passed inferolateral. Arthroscopic visualization proceeds.  There was a large amount of hypertrophic synovial tissue present at the junction between the quadriceps tendon and the patella.  This essentially obliterates the suprapatellar pouch.  Using combination of the shaver and the ArthroCare device, this tissue was debrided back to normal tissue and then cauterized.  It affectively recreated the suprapatellar pouch.  A small amount of tissue present inferiorly as well as in the lateral gutter and this was also debrided back to normal-appearing tissue.  Joints again inspected.  No other abnormal tissue was noted.  The arthroscopic equipment were  then removed from the lateral portals, which were closed with interrupted 4-0 nylon.  A 20 mL of 0.25% Marcaine with epinephrine was injected through the inflow cannula that was removed and that portal was closed with nylon.  Incisions were cleaned and dried and a bulky sterile dressing applied.  She was then awakened and transported to recovery in stable condition.     Gaynelle Arabian, M.D.     FA/MEDQ  D:  10/02/2015  T:  10/02/2015  Job:  [id number]

## 2015-10-02 NOTE — Anesthesia Postprocedure Evaluation (Signed)
Anesthesia Post Note  Patient: Marloe Trudo  Procedure(s) Performed: Procedure(s) (LRB): ARTHROSCOPY RIGHT KNEE WITH SYNOVECTOMY (Right)  Patient location during evaluation: PACU Anesthesia Type: General Level of consciousness: awake and alert Pain management: pain level controlled Vital Signs Assessment: post-procedure vital signs reviewed and stable Respiratory status: spontaneous breathing, nonlabored ventilation, respiratory function stable and patient connected to nasal cannula oxygen Cardiovascular status: blood pressure returned to baseline and stable Postop Assessment: no signs of nausea or vomiting Anesthetic complications: no Comments: Denies mouth pain this time.    Last Vitals:  Filed Vitals:   10/02/15 1000 10/02/15 1015  BP: 150/62 131/38  Pulse: 90 92  Temp:    Resp: 13 15    Last Pain:  Filed Vitals:   10/02/15 1043  PainSc: 3                  [name] Herman J

## 2015-10-02 NOTE — Op Note (Signed)
NAMEJALIN, SCHAB              ACCOUNT NO.:  [id number]  MEDICAL RECORD NO.:  [id number]  LOCATION:  [facility]                          FACILITY:  [facility]  PHYSICIAN:  Gaynelle Arabian, M.D.    DATE OF BIRTH:  1940-11-23  DATE OF PROCEDURE:  10/02/2015 DATE OF DISCHARGE:  09/02/2015                              OPERATIVE REPORT   PREOPERATIVE DIAGNOSIS:  Right knee patellar clunk syndrome.  POSTOPERATIVE DIAGNOSIS:  Right knee patellar clunk syndrome.  PROCEDURE:  Right knee arthroscopy with synovectomy.  SURGEON:  Gaynelle Arabian, M.D.  ASSISTANT:  None.  ANESTHESIA:  General.  ESTIMATED BLOOD LOSS:  Minimal.  DRAINS:  None.  COMPLICATIONS:  None.  CONDITION:  Stable to recovery.  BRIEF CLINICAL NOTE:  Marie Harrison is a 75 year old female who had a total knee arthroplasty a few years ago.  She has had recently developed painful popping consistent with patellar clunk syndrome.  She presents now for arthroscopy and synovectomy.  PROCEDURE IN DETAIL:  After successful administration of general anesthetic, a tourniquet was placed high on the right thigh and her right lower extremity, prepped and draped in the usual sterile fashion. Standard superomedial and inferolateral incisions were made.  Inflow cannula passed, superomedial.  Camera passed inferolateral. Arthroscopic visualization proceeds.  There was a large amount of hypertrophic synovial tissue present at the junction between the quadriceps tendon and the patella.  This essentially obliterates the suprapatellar pouch.  Using combination of the shaver and the ArthroCare device, this tissue was debrided back to normal tissue and then cauterized.  It affectively recreated the suprapatellar pouch.  A small amount of tissue present inferiorly as well as in the lateral gutter and this was also debrided back to normal-appearing tissue.  Joints again inspected.  No other abnormal tissue was noted.  The arthroscopic equipment were  then removed from the lateral portals, which were closed with interrupted 4-0 nylon.  A 20 mL of 0.25% Marcaine with epinephrine was injected through the inflow cannula that was removed and that portal was closed with nylon.  Incisions were cleaned and dried and a bulky sterile dressing applied.  She was then awakened and transported to recovery in stable condition.     Gaynelle Arabian, M.D.     FA/MEDQ  D:  10/02/2015  T:  10/02/2015  Job:  [id number]

## 2015-10-03 ENCOUNTER — Encounter (HOSPITAL_BASED_OUTPATIENT_CLINIC_OR_DEPARTMENT_OTHER): Payer: Self-pay | Admitting: Orthopedic Surgery

## 2015-10-03 NOTE — Addendum Note (Signed)
Addendum  created 10/03/15 0719 by Wanita Chamberlain, CRNA   Modules edited: Charges VN

## 2015-11-26 ENCOUNTER — Encounter: Payer: Self-pay | Admitting: Sports Medicine

## 2015-11-26 ENCOUNTER — Ambulatory Visit (INDEPENDENT_AMBULATORY_CARE_PROVIDER_SITE_OTHER): Payer: Medicare Other | Admitting: Sports Medicine

## 2015-11-26 VITALS — BP 149/73 | HR 72 | Ht 61.0 in | Wt 165.0 lb

## 2015-11-26 DIAGNOSIS — M170 Bilateral primary osteoarthritis of knee: Secondary | ICD-10-CM

## 2015-11-26 DIAGNOSIS — M25572 Pain in left ankle and joints of left foot: Secondary | ICD-10-CM

## 2015-11-26 DIAGNOSIS — M25579 Pain in unspecified ankle and joints of unspecified foot: Secondary | ICD-10-CM | POA: Insufficient documentation

## 2015-11-26 NOTE — Patient Instructions (Signed)
You do have knee arthritis on left Most on the inside  However the knee is catching in the hamstring tendon area  I think a lot of this starts with the foot turnout  Try the added arch support  We will make more orthotics if that helps  Exercise the muscles with 2 sets  8 to 10 Straight leg raise Side leg raise Back leg raise All with knee straight  Then leg curl - flex knee  If too easy add the ankle weight  See in 6 weeks

## 2015-11-26 NOTE — Progress Notes (Signed)
Patient ID: Marie Harrison, female   DOB: 1941-05-18, 75 y.o.   MRN: [id number]  CC: left knee and foot pain  Severe OA RT knee TKR 18 mos ago Then needed arthroscopic surgery to remove scar tissue Now RT knee working better and able to walk and stand with only mild pain  Left knee has been progressively more painful Now hurts to get out of car Flexion of knee has been extremely painful in post knee at times XRays have shown G 4 OA in medial compt  In 2014 seen at [facility] with bad foot pain She had treatment before that did not help Placed in custom orthotic that helped at [facility] in 2014 Recently starting to have more pain in foot and knee with walking Comes for my opinion  Past HX HTN and takes meds NO DM Hx CVA Depression Other Past problems reviewed  Soc. HX : non smoker now - quit [place]. Swelling in left knee if too much activity No recent giving way Foot problems started in youth with high stiff arch right and flattening of arch left No sciatica Denies LBP No numbness in feet  EXAM [place], mildly obese F in NAD BP 149/73 mmHg  Pulse 72  Ht 5\' 1"  (1.549 m)  Wt 165 lb (74.844 kg)  BMI 31.19 kg/m2  RT knee midline scar Mildly warm but not really swollen Good flexion and extension  LT knee shoes mild valgus shift No clear effusion today Extension is norm Flexion is painful from 110 to 120 and is limited by pain  Strength testing was mildly decreased in multiple motions of thigh  RT foto shows mild inc in arch height Left foot has loss of long arch Mild stiffness with ankle motion Ext rotation of left foot on ankle  XRAYS reviewed left and RT knee Medial comp shows collapse and sclerosis on left PF spurring and loss of space  RT knee shows prosthesis in place

## 2015-11-26 NOTE — Assessment & Plan Note (Signed)
Now s/p TKR on RT and that is working better after 2nd surg for scar tissue removal  Left knee shows G 4 OA and loss of joint space medially I think Marie Harrison may be able to improve this with exercise program since pain now is only mild unless walking  Gait is likely a factor as even current orthotics do not control dynamic genu valgus on giat

## 2015-11-26 NOTE — Assessment & Plan Note (Signed)
Today I added more arch support to left orthotic This seemed to help lessen the pronation This also reduced the genu valgus  Try this for 6 weeks and reck

## 2015-12-31 ENCOUNTER — Encounter: Payer: Self-pay | Admitting: Sports Medicine

## 2015-12-31 ENCOUNTER — Ambulatory Visit (INDEPENDENT_AMBULATORY_CARE_PROVIDER_SITE_OTHER): Payer: Medicare Other | Admitting: Sports Medicine

## 2015-12-31 VITALS — BP 134/63 | HR 66 | Ht 61.0 in | Wt 165.0 lb

## 2015-12-31 DIAGNOSIS — M25572 Pain in left ankle and joints of left foot: Secondary | ICD-10-CM | POA: Diagnosis not present

## 2015-12-31 DIAGNOSIS — M170 Bilateral primary osteoarthritis of knee: Secondary | ICD-10-CM | POA: Diagnosis not present

## 2015-12-31 MED ORDER — TRAMADOL HCL 50 MG PO TABS: ORAL_TABLET | ORAL | Status: DC

## 2015-12-31 NOTE — Assessment & Plan Note (Signed)
S/P TKR on right and she now has better flexion for this  With me she has chronic arthritic changes She is preserved pretty good extension and flexion I think she is a good candidate to try a physical therapy program Even if she needs a knee replacement in June this will help to strengthen her legs going into the program  We will add hip strengthening exercises until she is evaluated by physical therapy for knee OA study

## 2015-12-31 NOTE — Patient Instructions (Signed)
You have significant arthritis in your left knee.   Today your left shoe orthotic was changed to provide more support on the outside of your foot. This should help with the pain on the inside of your knee, and the outside of your lower leg.  We will provide you information to take part in the knee arthritis pain study at [facility], where you will receive specific exercises and physical therapy, and may be given a sleeve to wear on your knee.  It is ok to try to find a bike which allows you to sit back and not stress your knee as much. Do this as directed unless it becomes too painful. If you get established with the research study, they will direct which exercises you do.   As we discussed, reducing pain in your knee will reduce inflammation. For pain, try the following:  Tramadol (50 mg) by mouth twice every day. You may take with Advil occasionally as you have been.  Come back to see Dr. Oneida Alar in six weeks or sooner with concerns.

## 2015-12-31 NOTE — Progress Notes (Signed)
Patient ID: Marie Harrison, female   DOB: October 10, 1940, 75 y.o.   MRN: [id number] CC: Follow up on left knee pain  Severe OA RT knee TKA almost two years ago Then needed arthroscopic surgery to remove scar tissue Now RT knee working better and able to walk and stand with only mild pain  Left knee has been progressively more painful. Has tried bike for exercise as recommended, gets onset of pain and swelling within about five minutes.  Now hurts to get out of car Flexion of knee has been extremely painful in post knee at times XRays have shown G 4 OA in medial compt  Has been wearing insert in left shoe which has helped medial knee pain, but now has more shin pain with walking. If she is standing or walking for long periods, gets pain, tightness and swelling behind left knee which takes overnight to resolve. Takes Tylenol for pain mostly, occasional Advil.  Comes for my opinion today and to follow up. Still has LTKA scheduled for 26 June with Dr. Wynelle Link  Past HX HTN and takes meds NO DM Hx CVA Depression Other Past problems reviewed  Soc. HX : non smoker now - quit [place] has occasional swelling in left knee if too much activity No recent giving way Foot problems started in youth with high stiff arch right and flattening of arch left No sciatica Denies LBP No numbness in feet  EXAM BP 134/63 mmHg  Pulse 66  Ht 5\' 1"  (1.549 m)  Wt 165 lb (74.844 kg)  BMI 31.19 kg/m2 Ple[place], mildly obese F in NAD  RT knee midline scar Good flexion and extension Warm to touch, without erythema or swelling  LT knee shoes mild valgus shift No effusion today Extension/strength is norm Flexion is painful from 110 to 120 and is limited by pain  Strength testing was mildly decreased in multiple motions of thigh Hip abduction and rotation both weak  Left foot has loss of long arch Mild stiffness with ankle motion Ext rotation of left foot on ankle  XRAYS from prior  visit: Medial comp shows collapse and sclerosis on left PF spurring and loss of space RT knee shows prosthesis in place  A/P.  1. Left knee G4 OA. Continues to have pain and swelling with prolonged standing or walking and with brief exercise on bike. Medial joint pain has improved with increased arch support on left foot orthotic, but use increases strain and pain in anterior and lateral shin with walking. Keep surgery scheduled, but follow up here before then. Pt instructions and plan below.   You have significant arthritis in your left knee.   Today your left shoe orthotic was changed to provide more support on the outside of your foot. This should help with the pain on the inside of your knee, and the outside of your lower leg.  We will provide you information to take part in the knee arthritis pain study at [facility], where you will receive specific exercises and physical therapy, and may be given a sleeve to wear on your knee.  It is ok to try to find a bike which allows you to sit back and not stress your knee as much. Do this as directed unless it becomes too painful. If you get established with the research study, they will direct which exercises you do.   As we discussed, reducing pain in your knee will reduce inflammation. For pain, try the following:  Tramadol (50 mg)  by mouth twice every day. You may take with Advil occasionally as you have been.  Come back to see Dr. Oneida Alar in six weeks or sooner with concerns.

## 2015-12-31 NOTE — Assessment & Plan Note (Signed)
Scaphoid pad to build arch more was helpful to lessen pain  We will add a lateral post to lessen lateral leg pressure  Test this but if not working consider orthotic change

## 2016-02-12 ENCOUNTER — Ambulatory Visit: Payer: Medicare Other | Admitting: Sports Medicine

## 2016-02-21 ENCOUNTER — Other Ambulatory Visit (HOSPITAL_COMMUNITY): Payer: Self-pay | Admitting: *Deleted

## 2016-02-21 ENCOUNTER — Other Ambulatory Visit: Payer: Self-pay | Admitting: Surgical

## 2016-02-21 NOTE — Progress Notes (Signed)
MEDICAL CLEARANCE NOTE DR [facility] ON CHART FOR 03-02-16 SURGERY EKG AND LOV DR PIAZZA DATED 02-11-26 ON CHART UA AND MICRO 02-12-16 [facility] ON CHART

## 2016-02-21 NOTE — Patient Instructions (Addendum)
Marie Harrison  02/21/2016   Your procedure is scheduled on: 03-02-16  Report to [facility]  Entrance take Marie Harrison  elevators to 3rd floor to  Marie Harrison at 730 AM.  Call this number if you have problems the morning of surgery [phone]   Remember: ONLY 1 PERSON MAY GO WITH YOU TO SHORT STAY TO GET  READY MORNING OF [name].  Do not eat food or drink liquids :After Midnight.     Take these medicines the morning of surgery with A SIP OF WATER: FLUOXETINE (PROZAC), OMEPRAZOLE (PRILOSEC)                BRING [name] ENVELOPE DAY OF SURGERY.                               You may not have any metal on your body including hair pins and              piercings  Do not wear jewelry, make-up, lotions, powders or perfumes, deodorant             Do not wear nail polish.  Do not shave  48 hours prior to surgery.              Men may shave face and neck.   Do not bring valuables to the hospital. [place].  Contacts, dentures or bridgework may not be worn into surgery.  Leave suitcase in the car. After surgery it may be brought to your room.                  Please read over the following fact sheets you were given: _____________________________________________________________________             [facility] - Preparing for Surgery Before surgery, you can play an important role.  Because skin is not sterile, your skin needs to be as free of germs as possible.  You can reduce the number of germs on your skin by washing with CHG (chlorahexidine gluconate) soap before surgery.  CHG is an antiseptic cleaner which kills germs and bonds with the skin to continue killing germs even after washing. Please DO NOT use if you have an allergy to CHG or antibacterial soaps.  If your skin becomes reddened/irritated stop using the CHG and inform your nurse when you arrive at Short Stay. Do not shave (including legs and  underarms) for at least 48 hours prior to the first CHG shower.  You may shave your face/neck. Please follow these instructions carefully:  1.  Shower with CHG Soap the night before surgery and the  morning of Surgery.  2.  If you choose to wash your hair, wash your hair first as usual with your  normal  shampoo.  3.  After you shampoo, rinse your hair and body thoroughly to remove the  shampoo.                           4.  Use CHG as you would any other liquid soap.  You can apply chg directly  to the skin and wash  Gently with a scrungie or clean washcloth.  5.  Apply the CHG Soap to your body ONLY FROM THE NECK DOWN.   Do not use on face/ open                           Wound or open sores. Avoid contact with eyes, ears mouth and genitals (private parts).                       Wash face,  Genitals (private parts) with your normal soap.             6.  Wash thoroughly, paying special attention to the area where your surgery  will be performed.  7.  Thoroughly rinse your body with warm water from the neck down.  8.  DO NOT shower/wash with your normal soap after using and rinsing off  the CHG Soap.                9.  Pat yourself dry with a clean towel.            10.  Wear clean pajamas.            11.  Place clean sheets on your bed the night of your first shower and do not  sleep with pets. Day of Surgery : Do not apply any lotions/deodorants the morning of surgery.  Please wear clean clothes to the hospital/surgery Harrison.  FAILURE TO FOLLOW THESE INSTRUCTIONS MAY RESULT IN THE CANCELLATION OF YOUR SURGERY PATIENT SIGNATURE_________________________________  NURSE SIGNATURE__________________________________  ________________________________________________________________________   Marie Harrison  An incentive spirometer is a tool that can help keep your lungs clear and active. This tool measures how well you are filling your lungs with each breath. Taking  long deep breaths may help reverse or decrease the chance of developing breathing (pulmonary) problems (especially infection) following:  A long period of time when you are unable to move or be active. BEFORE THE PROCEDURE   If the spirometer includes an indicator to show your best effort, your nurse or respiratory therapist will set it to a desired goal.  If possible, sit up straight or lean slightly forward. Try not to slouch.  Hold the incentive spirometer in an upright position. INSTRUCTIONS FOR USE   Sit on the edge of your bed if possible, or sit up as far as you can in bed or on a chair.  Hold the incentive spirometer in an upright position.  Breathe out normally.  Place the mouthpiece in your mouth and seal your lips tightly around it.  Breathe in slowly and as deeply as possible, raising the piston or the ball toward the top of the column.  Hold your breath for 3-5 seconds or for as long as possible. Allow the piston or ball to fall to the bottom of the column.  Remove the mouthpiece from your mouth and breathe out normally.  Rest for a few seconds and repeat Steps 1 through 7 at least 10 times every 1-2 hours when you are awake. Take your time and take a few normal breaths between deep breaths.  The spirometer may include an indicator to show your best effort. Use the indicator as a goal to work toward during each repetition.  After each set of 10 deep breaths, practice coughing to be sure your lungs are clear. If you have an incision (the cut made at the time of surgery),  support your incision when coughing by placing a pillow or rolled up towels firmly against it. Once you are able to get out of bed, walk around indoors and cough well. You may stop using the incentive spirometer when instructed by your caregiver.  RISKS AND COMPLICATIONS  Take your time so you do not get dizzy or light-headed.  If you are in pain, you may need to take or ask for pain medication before  doing incentive spirometry. It is harder to take a deep breath if you are having pain. AFTER USE  Rest and breathe slowly and easily.  It can be helpful to keep track of a log of your progress. Your caregiver can provide you with a simple table to help with this. If you are using the spirometer at home, follow these instructions: [place] IF:   You are having difficultly using the spirometer.  You have trouble using the spirometer as often as instructed.  Your pain medication is not giving enough relief while using the spirometer.  You develop fever of 100.5 F (38.1 C) or higher. SEEK IMMEDIATE MEDICAL CARE IF:   You cough up bloody sputum that had not been present before.  You develop fever of 102 F (38.9 C) or greater.  You develop worsening pain at or near the incision site. MAKE SURE YOU:   Understand these instructions.  Will watch your condition.  Will get help right away if you are not doing well or get worse. Document Released: 01/04/2007 Document Revised: 11/16/2011 Document Reviewed: 03/07/2007 ExitCare Patient Information 2014 ExitCare, [place].   ________________________________________________________________________  WHAT IS A BLOOD TRANSFUSION? Blood Transfusion Information  A transfusion is the replacement of blood or some of its parts. Blood is made up of multiple cells which provide different functions.  Red blood cells carry oxygen and are used for blood loss replacement.  White blood cells fight against infection.  Platelets control bleeding.  Plasma helps clot blood.  Other blood products are available for specialized needs, such as hemophilia or other clotting disorders. BEFORE THE TRANSFUSION  Who gives blood for transfusions?   Healthy volunteers who are fully evaluated to make sure their blood is safe. This is blood bank blood. Transfusion therapy is the safest it has ever been in the practice of medicine. Before blood is taken  from a donor, a complete history is taken to make sure that person has no history of diseases nor engages in risky social behavior (examples are intravenous drug use or sexual activity with multiple partners). The donor's travel history is screened to minimize risk of transmitting infections, such as malaria. The donated blood is tested for signs of infectious diseases, such as HIV and hepatitis. The blood is then tested to be sure it is compatible with you in order to minimize the chance of a transfusion reaction. If you or a relative donates blood, this is often done in anticipation of surgery and is not appropriate for emergency situations. It takes many days to process the donated blood. RISKS AND COMPLICATIONS Although transfusion therapy is very safe and saves many lives, the main dangers of transfusion include:   Getting an infectious disease.  Developing a transfusion reaction. This is an allergic reaction to something in the blood you were given. Every precaution is taken to prevent this. The decision to have a blood transfusion has been considered carefully by your caregiver before blood is given. Blood is not given unless the benefits outweigh the risks. AFTER THE TRANSFUSION  Right after receiving a blood transfusion, you will usually feel much better and more energetic. This is especially true if your red blood cells have gotten low (anemic). The transfusion raises the level of the red blood cells which carry oxygen, and this usually causes an energy increase.  The nurse administering the transfusion will monitor you carefully for complications. HOME CARE INSTRUCTIONS  No special instructions are needed after a transfusion. You may find your energy is better. Speak with your caregiver about any limitations on activity for underlying diseases you may have. SEEK MEDICAL CARE IF:   Your condition is not improving after your transfusion.  You develop redness or irritation at the  intravenous (IV) site. SEEK IMMEDIATE MEDICAL CARE IF:  Any of the following symptoms occur over the next 12 hours:  Shaking chills.  You have a temperature by mouth above 102 F (38.9 C), not controlled by medicine.  Chest, back, or muscle pain.  People around you feel you are not acting correctly or are confused.  Shortness of breath or difficulty breathing.  Dizziness and fainting.  You get a rash or develop hives.  You have a decrease in urine output.  Your urine turns a dark color or changes to pink, red, or brown. Any of the following symptoms occur over the next 10 days:  You have a temperature by mouth above 102 F (38.9 C), not controlled by medicine.  Shortness of breath.  Weakness after normal activity.  The white part of the eye turns yellow (jaundice).  You have a decrease in the amount of urine or are urinating less often.  Your urine turns a dark color or changes to pink, red, or brown. Document Released: 08/21/2000 Document Revised: 11/16/2011 Document Reviewed: 04/09/2008 [facility] Patient Information 2014 [place], [place].  _______________________________________________________________________

## 2016-02-24 ENCOUNTER — Ambulatory Visit (HOSPITAL_COMMUNITY)
Admission: RE | Admit: 2016-02-24 | Discharge: 2016-02-24 | Disposition: A | Discharge status: Home / Self Care | Payer: Medicare Other | Source: Ambulatory Visit | Attending: Surgical | Admitting: Surgical

## 2016-02-24 ENCOUNTER — Encounter (HOSPITAL_COMMUNITY): Payer: Self-pay

## 2016-02-24 ENCOUNTER — Encounter (HOSPITAL_COMMUNITY)
Admission: RE | Admit: 2016-02-24 | Discharge: 2016-02-24 | Disposition: A | Discharge status: Home / Self Care | Payer: Medicare Other | Source: Ambulatory Visit | Attending: Orthopedic Surgery | Admitting: Orthopedic Surgery

## 2016-02-24 DIAGNOSIS — Z01818 Encounter for other preprocedural examination: Secondary | ICD-10-CM | POA: Diagnosis not present

## 2016-02-24 HISTORY — DX: Fibromyalgia: M79.7

## 2016-02-24 LAB — CBC WITH DIFFERENTIAL/PLATELET
Basophils Absolute: 0.1 10*3/uL (ref 0.0–0.1)
Basophils Relative: 1 %
Eosinophils Absolute: 0.5 10*3/uL (ref 0.0–0.7)
Eosinophils Relative: 6 %
HCT: 36 % (ref 36.0–46.0)
Hemoglobin: 12.1 g/dL (ref 12.0–15.0)
Lymphocytes Relative: 35 %
Lymphs Abs: 3 10*3/uL (ref 0.7–4.0)
MCH: 31.5 pg (ref 26.0–34.0)
MCHC: 33.6 g/dL (ref 30.0–36.0)
MCV: 93.8 fL (ref 78.0–100.0)
Monocytes Absolute: 0.8 10*3/uL (ref 0.1–1.0)
Monocytes Relative: 9 %
Neutro Abs: 4.3 10*3/uL (ref 1.7–7.7)
Neutrophils Relative %: 49 %
Platelets: 313 10*3/uL (ref 150–400)
RBC: 3.84 MIL/uL — ABNORMAL LOW (ref 3.87–5.11)
RDW: 15.6 % — ABNORMAL HIGH (ref 11.5–15.5)
WBC: 8.7 10*3/uL (ref 4.0–10.5)

## 2016-02-24 LAB — COMPREHENSIVE METABOLIC PANEL
ALT: 20 U/L (ref 14–54)
AST: 23 U/L (ref 15–41)
Albumin: 4.6 g/dL (ref 3.5–5.0)
Alkaline Phosphatase: 64 U/L (ref 38–126)
Anion gap: 7 (ref 5–15)
BUN: 13 mg/dL (ref 6–20)
CO2: 28 mmol/L (ref 22–32)
Calcium: 9 mg/dL (ref 8.9–10.3)
Chloride: 97 mmol/L — ABNORMAL LOW (ref 101–111)
Creatinine, Ser: 0.55 mg/dL (ref 0.44–1.00)
GFR calc Af Amer: >60 mL/min (ref >60)
GFR calc non Af Amer: >60 mL/min (ref >60)
Glucose, Bld: 97 mg/dL (ref 65–99)
Potassium: 3.8 mmol/L (ref 3.5–5.1)
Sodium: 132 mmol/L — ABNORMAL LOW (ref 135–145)
Total Bilirubin: 0.2 mg/dL — ABNORMAL LOW (ref 0.3–1.2)
Total Protein: 7 g/dL (ref 6.5–8.1)

## 2016-02-24 LAB — PROTIME-INR
INR: 1.04 (ref 0.00–1.49)
Prothrombin Time: 13.4 seconds (ref 11.6–15.2)

## 2016-02-24 LAB — APTT: aPTT: 30 seconds (ref 24–37)

## 2016-02-24 LAB — SURGICAL PCR SCREEN
MRSA, PCR: NEGATIVE
STAPHYLOCOCCUS AUREUS: NEGATIVE

## 2016-02-24 NOTE — Progress Notes (Signed)
MICRO UA AND CULTURE FROM [facility] 01-27-16 ON CHART, REPEAT UA TODAY DUE TO PATIENT STATES SOME BURING WITH URINATION.

## 2016-02-25 ENCOUNTER — Other Ambulatory Visit (HOSPITAL_COMMUNITY): Payer: Medicare Other

## 2016-02-25 LAB — URINALYSIS, ROUTINE W REFLEX MICROSCOPIC
Bilirubin Urine: NEGATIVE
GLUCOSE, UA: NEGATIVE mg/dL
HGB URINE DIPSTICK: NEGATIVE
KETONES UR: NEGATIVE mg/dL
Leukocytes, UA: NEGATIVE
Nitrite: NEGATIVE
PH: 6 (ref 5.0–8.0)
PROTEIN: NEGATIVE mg/dL
Specific Gravity, Urine: 1.011 (ref 1.005–1.030)

## 2016-03-01 ENCOUNTER — Ambulatory Visit: Payer: Self-pay | Admitting: Orthopedic Surgery

## 2016-03-01 NOTE — H&P (Signed)
Marie Harrison DOB: 16-Feb-1941 Married / Language: English / Race: White Female Date of Admission:  03/02/2016 CC:  Left Knee Pain History of Present Illness The patient is a 75 year old female who comes in for a preoperative History and Physical. The patient is scheduled for a left total knee arthroplasty to be performed by Dr. Dione Plover. Aluisio, MD at [facility] on 03-02-2016. The patient is being followed for their bilateral knee pain. She is now several month(s) out from knee arthoscopy for patellar clunk on the right total knee that was performed back in august of 2015. The left knee continues to be a problem. The injections did not provide significant releif. Symptoms reported include: pain, swelling (slightly), aching and stiffness. The patient has reported improvement of their symptoms with: viscosupplementation which only helped for a short time. The patient feels that they are doing poorly. The following medication has been used for pain control: antiinflammatory medication (Ibuprofen). Patient states that she has noticed some improvement, but still painful. The right wass more painful in comparison to the left so she had the right knee replaced first. She is now ready to proceed with the left knee repalcement at this time. She has been doing home exercises in preparation for the surgery. They have been treated conservatively in the past for the above stated problem and despite conservative measures, they continue to have progressive pain and severe functional limitations and dysfunction. They have failed non-operative management including home exercise, medications, and injections. It is felt that they would benefit from undergoing total joint replacement. Risks and benefits of the procedure have been discussed with the patient and they elect to proceed with surgery. There are no active contraindications to surgery such as ongoing infection or rapidly progressive neurological  disease.   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 Cipro *Fluoroquinolones**  Itching, Nausea, Vomiting. Sulfa 10 *OPHTHALMIC AGENTS*  Itching, Nausea, Vomiting.  Family History Congestive Heart Failure  Father. father Hypertension  father and grandmother mothers side Heart Disease  father and grandmother mothers side Osteoporosis  mother Depression  grandmother mothers side Cerebrovascular Accident  mother, father and grandmother mothers side Diabetes Mellitus  father and grandmother fathers side Cancer  First Degree Relatives. mother Osteoarthritis  mother and grandmother mothers side  Social History  Current work status  retired Careers information officer  2 Living situation  live with spouse Drug/Alcohol Rehab (Currently)  no Number of flights of stairs before winded  1 Marital status  married Exercise  Exercises rarely; does other Drug/Alcohol Rehab (Previously)  no Alcohol use  current drinker; drinks beer and wine; only occasionally per week Tobacco use  Former smoker. former smoker; smoke(d) less than 1/2 pack(s) per day Tobacco / smoke exposure  no Pain Contract  no Illicit drug use  no Post-Surgical Plans  Home versus Skilled Level at [facility]  Medication History  Prolia (60MG /ML Solution, Subcutaneous)  Active. Vitamin D (Oral) Specific strength unknown - Active. Tylenol (325MG  Tablet, Oral) Active. LORazepam (0.5MG  Tablet, QHS PRN Sleep Oral) Active. Triamcinolone Acetonide (0.1% Cream, External) Active. Mucinex (600MG  Tablet ER 12HR, Oral as needed) Active. Claritin (10MG  Capsule, Oral) Active. (loratadine) Aspirin (81MG  Tablet, 1 Oral) Active. Lovastatin (40MG  Tablet, Oral) Active. FLUoxetine HCl (20MG  Capsule, Oral) Active. Lisinopril (40MG  Tablet, Oral) Active. (80 mg per day) Potassium Chloride CR (10MEQ Capsule ER, Oral) Active. Omeprazole (40MG  Capsule DR, Oral) Active. (prn) Meloxicam (7.5MG  Tablet, Oral) Active.   Past Surgical History Foot Surgery  right Dilation and Curettage of Uterus  Arthroscopic Knee Surgery - Right  Arthroscopy of Knee  Left Tonsillectomy  Cataract Surgery  Bilateral Total Knee Replacement - Right    Review of Systems General Not Present- Chills, Fatigue, Fever, Memory Loss, Night Sweats, Weight Gain and Weight Loss. Skin Not Present- Eczema, Hives, Itching, Lesions and Rash. HEENT Present- Tinnitus. Not Present- Dentures, Double Vision, Headache, Hearing Loss and Visual Loss. Respiratory Not Present- Allergies, Chronic Cough, Coughing up blood, Shortness of breath at rest and Shortness of breath with exertion. Cardiovascular Not Present- Chest Pain, Difficulty Breathing Lying Down, Murmur, Palpitations, Racing/skipping heartbeats and Swelling. Gastrointestinal Not Present- Abdominal Pain, Bloody Stool, Constipation, Diarrhea, Difficulty Swallowing, Heartburn, Jaundice, Loss of appetitie, Nausea and Vomiting. Female Genitourinary Present- Urinating at Night. Not Present- Blood in Urine, Discharge, Flank Pain, Incontinence, Painful Urination, Urgency, Urinary frequency, Urinary Retention and Weak urinary stream. Musculoskeletal Present- Back Pain, Joint  Pain, Joint Swelling, Morning Stiffness and Muscle Pain. Not Present- Muscle Weakness and Spasms. Neurological Not Present- Blackout spells, Difficulty with balance, Dizziness, Paralysis, Tremor and Weakness. Psychiatric Not Present- Insomnia.  Vitals Weight: 165 lb Height: 61in Weight was reported by patient. Height was reported by patient. Body Surface Area: 1.74 m Body Mass Index: 31.18 kg/m  Pulse: 72 (Regular)  BP: 128/68 (Sitting, Right Arm, Standard)  Physical Exam  General Mental Status -Alert, cooperative and good historian. General Appearance-pleasant, Not in acute distress. Orientation-Oriented X3. Build & Nutrition-Well nourished and Well developed.  Head and Neck Head-normocephalic, atraumatic . Neck Global Assessment - supple, no bruit auscultated on the right, no bruit auscultated on the left. Note: upper partial and lower partial dentures   Eye Pupil - Bilateral-Regular and Round. Motion - Bilateral-EOMI.  Chest and Lung Exam Auscultation Breath sounds - clear at anterior chest wall and clear at posterior chest wall. Adventitious sounds - No Adventitious sounds.  Cardiovascular Auscultation Rhythm - Regular rate and rhythm. Heart Sounds - S1 WNL and S2 WNL. Murmurs & Other Heart Sounds - Auscultation of the heart reveals - No Murmurs.  Abdomen Palpation/Percussion Tenderness - Abdomen is non-tender to palpation. Rigidity (guarding) - Abdomen is soft. Auscultation Auscultation of the abdomen reveals - Bowel sounds normal.  Female Genitourinary Note: Not done, not pertinent to present illness   Musculoskeletal Note: She is in no acute distress. Alert and oriented x3. Symmetric respiratory excursion. Examination of the left knee reveals no skin wounds or lesions. She has a varus deformity. She does have some swelling. No erythema, no effusion. She has tenderness to palpation of the hamstring tendons and onto the pes anserine  insertion. Range of motion is roughly 10 to 80 degrees. There is no ligamentous instability. She has painless range of motion of the hip distally. No focal motor or sensory deficit. She has palpable pedal pulses.  DIAGNOSTIC TESTS Weightbearing AP, PA, flexion, merchant view of bilateral knees, lateral view of the left knee. She  has a cemented right total knee arthroplasty in excellent alignment. On the left side, she has bone-on-bone medial compartment arthritis, varus deformity. She does have some patellofemoral arthritic change as well.   Assessment & Plan  Localized osteoarthritis of left knee (M17.12)  Note:Surgical Plans: Left Right Total Knee Hip Replacement - Anterior Approach  Disposition: Lives at [facility]  PCP: Merilynn Finland  Topical TXA - History of Stroke  Anesthesia Issues: None  Signed electronically by Ok Edwards, III PA-C

## 2016-03-02 ENCOUNTER — Encounter (HOSPITAL_COMMUNITY): Payer: Self-pay

## 2016-03-02 ENCOUNTER — Inpatient Hospital Stay (HOSPITAL_COMMUNITY): Payer: Medicare Other | Admitting: Anesthesiology

## 2016-03-02 ENCOUNTER — Inpatient Hospital Stay (HOSPITAL_COMMUNITY)
Admission: RE | Admit: 2016-03-02 | Discharge: 2016-03-05 | DRG: 470 | Disposition: A | Discharge status: Skilled Nursing Facility | Payer: Medicare Other | Source: Ambulatory Visit | Attending: Orthopedic Surgery | Admitting: Orthopedic Surgery

## 2016-03-02 ENCOUNTER — Encounter (HOSPITAL_COMMUNITY)
Admission: RE | Disposition: A | Discharge status: Skilled Nursing Facility | Payer: Self-pay | Source: Ambulatory Visit | Attending: Orthopedic Surgery

## 2016-03-02 DIAGNOSIS — Z87440 Personal history of urinary (tract) infections: Secondary | ICD-10-CM

## 2016-03-02 DIAGNOSIS — M1712 Unilateral primary osteoarthritis, left knee: Principal | ICD-10-CM | POA: Diagnosis present

## 2016-03-02 DIAGNOSIS — Z87891 Personal history of nicotine dependence: Secondary | ICD-10-CM | POA: Diagnosis not present

## 2016-03-02 DIAGNOSIS — M810 Age-related osteoporosis without current pathological fracture: Secondary | ICD-10-CM | POA: Diagnosis present

## 2016-03-02 DIAGNOSIS — Z8673 Personal history of transient ischemic attack (TIA), and cerebral infarction without residual deficits: Secondary | ICD-10-CM | POA: Diagnosis not present

## 2016-03-02 DIAGNOSIS — Z79899 Other long term (current) drug therapy: Secondary | ICD-10-CM

## 2016-03-02 DIAGNOSIS — F329 Major depressive disorder, single episode, unspecified: Secondary | ICD-10-CM | POA: Diagnosis present

## 2016-03-02 DIAGNOSIS — E785 Hyperlipidemia, unspecified: Secondary | ICD-10-CM | POA: Diagnosis present

## 2016-03-02 DIAGNOSIS — Z8262 Family history of osteoporosis: Secondary | ICD-10-CM | POA: Diagnosis not present

## 2016-03-02 DIAGNOSIS — Z7982 Long term (current) use of aspirin: Secondary | ICD-10-CM

## 2016-03-02 DIAGNOSIS — M1710 Unilateral primary osteoarthritis, unspecified knee: Secondary | ICD-10-CM | POA: Diagnosis present

## 2016-03-02 DIAGNOSIS — I10 Essential (primary) hypertension: Secondary | ICD-10-CM | POA: Diagnosis present

## 2016-03-02 DIAGNOSIS — M797 Fibromyalgia: Secondary | ICD-10-CM | POA: Diagnosis present

## 2016-03-02 DIAGNOSIS — Z96651 Presence of right artificial knee joint: Secondary | ICD-10-CM | POA: Diagnosis present

## 2016-03-02 DIAGNOSIS — M179 Osteoarthritis of knee, unspecified: Secondary | ICD-10-CM | POA: Diagnosis present

## 2016-03-02 DIAGNOSIS — K219 Gastro-esophageal reflux disease without esophagitis: Secondary | ICD-10-CM | POA: Diagnosis present

## 2016-03-02 DIAGNOSIS — R0602 Shortness of breath: Secondary | ICD-10-CM

## 2016-03-02 DIAGNOSIS — Z6831 Body mass index (BMI) 31.0-31.9, adult: Secondary | ICD-10-CM | POA: Diagnosis not present

## 2016-03-02 DIAGNOSIS — M21162 Varus deformity, not elsewhere classified, left knee: Secondary | ICD-10-CM | POA: Diagnosis present

## 2016-03-02 DIAGNOSIS — M25562 Pain in left knee: Secondary | ICD-10-CM | POA: Diagnosis present

## 2016-03-02 DIAGNOSIS — E669 Obesity, unspecified: Secondary | ICD-10-CM | POA: Diagnosis present

## 2016-03-02 HISTORY — PX: TOTAL KNEE ARTHROPLASTY: SHX125

## 2016-03-02 LAB — TYPE AND SCREEN
ABO/RH(D): A POS
ANTIBODY SCREEN: NEGATIVE

## 2016-03-02 SURGERY — ARTHROPLASTY, KNEE, TOTAL
Anesthesia: Monitor Anesthesia Care | Site: Knee | Laterality: Left

## 2016-03-02 MED ORDER — IPRATROPIUM BROMIDE 0.03 % NA SOLN
2.0000 | Freq: Two times a day (BID) | NASAL | Status: DC
  Administered 2016-03-02 – 2016-03-05 (×5): 2 via NASAL
  Filled 2016-03-02: qty 30

## 2016-03-02 MED ORDER — SODIUM CHLORIDE 0.9 % IJ SOLN
INTRAMUSCULAR | Status: DC | PRN
  Administered 2016-03-02: 30 mL

## 2016-03-02 MED ORDER — PANTOPRAZOLE SODIUM 40 MG PO TBEC
80.0000 mg | DELAYED_RELEASE_TABLET | Freq: Every day | ORAL | Status: DC
  Administered 2016-03-03 – 2016-03-05 (×3): 80 mg via ORAL
  Filled 2016-03-02 (×3): qty 2

## 2016-03-02 MED ORDER — ACETAMINOPHEN 10 MG/ML IV SOLN
1000.0000 mg | Freq: Once | INTRAVENOUS | Status: AC
  Administered 2016-03-02: 1000 mg via INTRAVENOUS

## 2016-03-02 MED ORDER — FENTANYL CITRATE (PF) 250 MCG/5ML IJ SOLN
INTRAMUSCULAR | Status: DC | PRN
  Administered 2016-03-02 (×2): 50 ug via INTRAVENOUS

## 2016-03-02 MED ORDER — ONDANSETRON HCL 4 MG/2ML IJ SOLN
INTRAMUSCULAR | Status: AC
  Filled 2016-03-02: qty 2

## 2016-03-02 MED ORDER — LORAZEPAM 0.5 MG PO TABS
0.2500 mg | ORAL_TABLET | Freq: Every day | ORAL | Status: DC
  Administered 2016-03-02 – 2016-03-04 (×3): 0.25 mg via ORAL
  Filled 2016-03-02 (×3): qty 1

## 2016-03-02 MED ORDER — TRANEXAMIC ACID 1000 MG/10ML IV SOLN: 1000.0000 mg | INTRAVENOUS | Status: DC

## 2016-03-02 MED ORDER — METHOCARBAMOL 500 MG PO TABS: 500.0000 mg | ORAL_TABLET | Freq: Four times a day (QID) | ORAL | Status: DC | PRN

## 2016-03-02 MED ORDER — DOCUSATE SODIUM 100 MG PO CAPS
100.0000 mg | ORAL_CAPSULE | Freq: Two times a day (BID) | ORAL | Status: DC
  Administered 2016-03-02 – 2016-03-05 (×7): 100 mg via ORAL
  Filled 2016-03-02 (×7): qty 1

## 2016-03-02 MED ORDER — PRAVASTATIN SODIUM 20 MG PO TABS
40.0000 mg | ORAL_TABLET | Freq: Every day | ORAL | Status: DC
  Administered 2016-03-02 – 2016-03-04 (×3): 40 mg via ORAL
  Filled 2016-03-02 (×3): qty 2

## 2016-03-02 MED ORDER — METHOCARBAMOL 1000 MG/10ML IJ SOLN
500.0000 mg | Freq: Four times a day (QID) | INTRAVENOUS | Status: DC | PRN
  Administered 2016-03-02: 500 mg via INTRAVENOUS
  Filled 2016-03-02: qty 5
  Filled 2016-03-02: qty 550

## 2016-03-02 MED ORDER — LORATADINE 10 MG PO TABS: 10.0000 mg | ORAL_TABLET | Freq: Every day | ORAL | Status: DC | PRN

## 2016-03-02 MED ORDER — BISACODYL 10 MG RE SUPP: 10.0000 mg | Freq: Every day | RECTAL | Status: DC | PRN

## 2016-03-02 MED ORDER — HYDROMORPHONE HCL 2 MG PO TABS
2.0000 mg | ORAL_TABLET | ORAL | Status: DC | PRN
  Administered 2016-03-02 (×2): 4 mg via ORAL
  Administered 2016-03-02: 2 mg via ORAL
  Administered 2016-03-03 (×2): 4 mg via ORAL
  Filled 2016-03-02: qty 2
  Filled 2016-03-02: qty 1
  Filled 2016-03-02 (×4): qty 2

## 2016-03-02 MED ORDER — CEFAZOLIN SODIUM-DEXTROSE 2-4 GM/100ML-% IV SOLN
2.0000 g | INTRAVENOUS | Status: AC
  Administered 2016-03-02: 2 g via INTRAVENOUS
  Filled 2016-03-02: qty 100

## 2016-03-02 MED ORDER — ONDANSETRON HCL 4 MG/2ML IJ SOLN
4.0000 mg | Freq: Four times a day (QID) | INTRAMUSCULAR | Status: DC | PRN
  Administered 2016-03-03: 4 mg via INTRAVENOUS
  Filled 2016-03-02: qty 2

## 2016-03-02 MED ORDER — PROPOFOL 10 MG/ML IV BOLUS
INTRAVENOUS | Status: AC
  Filled 2016-03-02: qty 40

## 2016-03-02 MED ORDER — ACETAMINOPHEN 325 MG PO TABS
650.0000 mg | ORAL_TABLET | Freq: Four times a day (QID) | ORAL | Status: DC | PRN
  Administered 2016-03-04 – 2016-03-05 (×4): 650 mg via ORAL
  Filled 2016-03-02 (×5): qty 2

## 2016-03-02 MED ORDER — MENTHOL 3 MG MT LOZG: 1.0000 | LOZENGE | OROMUCOSAL | Status: DC | PRN

## 2016-03-02 MED ORDER — BUPIVACAINE HCL 0.25 % IJ SOLN
INTRAMUSCULAR | Status: DC | PRN
  Administered 2016-03-02: 30 mL

## 2016-03-02 MED ORDER — SCOPOLAMINE 1 MG/3DAYS TD PT72
1.0000 | MEDICATED_PATCH | TRANSDERMAL | Status: DC
  Administered 2016-03-02: 1.5 mg via TRANSDERMAL
  Filled 2016-03-02: qty 1

## 2016-03-02 MED ORDER — METOCLOPRAMIDE HCL 5 MG PO TABS: 5.0000 mg | ORAL_TABLET | Freq: Three times a day (TID) | ORAL | Status: DC | PRN

## 2016-03-02 MED ORDER — CEFAZOLIN SODIUM-DEXTROSE 2-4 GM/100ML-% IV SOLN
INTRAVENOUS | Status: AC
  Filled 2016-03-02: qty 100

## 2016-03-02 MED ORDER — ACETAMINOPHEN 500 MG PO TABS
1000.0000 mg | ORAL_TABLET | Freq: Four times a day (QID) | ORAL | Status: AC
  Administered 2016-03-02 – 2016-03-03 (×4): 1000 mg via ORAL
  Filled 2016-03-02 (×4): qty 2

## 2016-03-02 MED ORDER — ACETAMINOPHEN 10 MG/ML IV SOLN
INTRAVENOUS | Status: AC
Start: 2016-03-02 — End: 2016-03-02
  Filled 2016-03-02: qty 100

## 2016-03-02 MED ORDER — SODIUM CHLORIDE 0.9 % IJ SOLN
INTRAMUSCULAR | Status: AC
Start: 2016-03-02 — End: 2016-03-02
  Filled 2016-03-02: qty 50

## 2016-03-02 MED ORDER — TRAMADOL HCL 50 MG PO TABS
50.0000 mg | ORAL_TABLET | Freq: Four times a day (QID) | ORAL | Status: DC | PRN
  Administered 2016-03-02: 100 mg via ORAL
  Administered 2016-03-05 (×2): 50 mg via ORAL
  Filled 2016-03-02 (×2): qty 1
  Filled 2016-03-02: qty 2

## 2016-03-02 MED ORDER — HYDROMORPHONE HCL 1 MG/ML IJ SOLN: 0.2500 mg | INTRAMUSCULAR | Status: DC | PRN

## 2016-03-02 MED ORDER — TRANEXAMIC ACID 1000 MG/10ML IV SOLN: 2000.0000 mg | Freq: Once | INTRAVENOUS | Status: DC

## 2016-03-02 MED ORDER — ONDANSETRON HCL 4 MG/2ML IJ SOLN
INTRAMUSCULAR | Status: DC | PRN
Start: 2016-03-02 — End: 2016-03-02
  Administered 2016-03-02: 4 mg via INTRAVENOUS

## 2016-03-02 MED ORDER — ACETAMINOPHEN 650 MG RE SUPP: 650.0000 mg | Freq: Four times a day (QID) | RECTAL | Status: DC | PRN

## 2016-03-02 MED ORDER — PROPOFOL 10 MG/ML IV BOLUS
INTRAVENOUS | Status: DC | PRN
  Administered 2016-03-02: 20 mg via INTRAVENOUS

## 2016-03-02 MED ORDER — LACTATED RINGERS IV SOLN
INTRAVENOUS | Status: DC
  Administered 2016-03-02 (×2): via INTRAVENOUS

## 2016-03-02 MED ORDER — PHENOL 1.4 % MT LIQD
1.0000 | OROMUCOSAL | Status: DC | PRN
  Filled 2016-03-02: qty 177

## 2016-03-02 MED ORDER — SODIUM CHLORIDE 0.9 % IV SOLN
INTRAVENOUS | Status: DC
  Administered 2016-03-02 – 2016-03-03 (×2): via INTRAVENOUS

## 2016-03-02 MED ORDER — MIDAZOLAM HCL 2 MG/2ML IJ SOLN
INTRAMUSCULAR | Status: AC
  Filled 2016-03-02: qty 2

## 2016-03-02 MED ORDER — PROMETHAZINE HCL 25 MG/ML IJ SOLN: 6.2500 mg | INTRAMUSCULAR | Status: DC | PRN

## 2016-03-02 MED ORDER — SODIUM CHLORIDE 0.9 % IV SOLN
10.0000 mg | INTRAVENOUS | Status: DC | PRN
  Administered 2016-03-02: 15 ug/min via INTRAVENOUS

## 2016-03-02 MED ORDER — FLEET ENEMA 7-19 GM/118ML RE ENEM: 1.0000 | ENEMA | Freq: Once | RECTAL | Status: DC | PRN

## 2016-03-02 MED ORDER — DEXAMETHASONE SODIUM PHOSPHATE 10 MG/ML IJ SOLN: 10.0000 mg | Freq: Once | INTRAMUSCULAR | Status: DC

## 2016-03-02 MED ORDER — METOCLOPRAMIDE HCL 5 MG/ML IJ SOLN: 5.0000 mg | Freq: Three times a day (TID) | INTRAMUSCULAR | Status: DC | PRN

## 2016-03-02 MED ORDER — POLYETHYLENE GLYCOL 3350 17 G PO PACK: 17.0000 g | PACK | Freq: Every day | ORAL | Status: DC | PRN

## 2016-03-02 MED ORDER — CEFAZOLIN SODIUM-DEXTROSE 2-4 GM/100ML-% IV SOLN
2.0000 g | Freq: Four times a day (QID) | INTRAVENOUS | Status: AC
  Administered 2016-03-02 (×2): 2 g via INTRAVENOUS
  Filled 2016-03-02 (×2): qty 100

## 2016-03-02 MED ORDER — BUPIVACAINE HCL (PF) 0.25 % IJ SOLN
INTRAMUSCULAR | Status: AC
  Filled 2016-03-02: qty 30

## 2016-03-02 MED ORDER — FLUOXETINE HCL 20 MG PO CAPS
20.0000 mg | ORAL_CAPSULE | Freq: Every morning | ORAL | Status: DC
  Administered 2016-03-03 – 2016-03-05 (×3): 20 mg via ORAL
  Filled 2016-03-02 (×4): qty 1

## 2016-03-02 MED ORDER — HYDROMORPHONE HCL 1 MG/ML IJ SOLN
0.5000 mg | INTRAMUSCULAR | Status: DC | PRN
  Administered 2016-03-02: 0.5 mg via INTRAVENOUS
  Filled 2016-03-02: qty 1

## 2016-03-02 MED ORDER — DEXAMETHASONE SODIUM PHOSPHATE 10 MG/ML IJ SOLN
10.0000 mg | Freq: Once | INTRAMUSCULAR | Status: AC
  Administered 2016-03-03: 10 mg via INTRAVENOUS
  Filled 2016-03-02: qty 1

## 2016-03-02 MED ORDER — DEXAMETHASONE SODIUM PHOSPHATE 10 MG/ML IJ SOLN
INTRAMUSCULAR | Status: DC | PRN
  Administered 2016-03-02: 10 mg via INTRAVENOUS

## 2016-03-02 MED ORDER — RIVAROXABAN 10 MG PO TABS
10.0000 mg | ORAL_TABLET | Freq: Every day | ORAL | Status: DC
  Administered 2016-03-03 – 2016-03-05 (×3): 10 mg via ORAL
  Filled 2016-03-02 (×3): qty 1

## 2016-03-02 MED ORDER — FENTANYL CITRATE (PF) 250 MCG/5ML IJ SOLN
INTRAMUSCULAR | Status: AC
  Filled 2016-03-02: qty 5

## 2016-03-02 MED ORDER — POTASSIUM CHLORIDE CRYS ER 10 MEQ PO TBCR
10.0000 meq | EXTENDED_RELEASE_TABLET | Freq: Two times a day (BID) | ORAL | Status: DC
  Administered 2016-03-02 – 2016-03-05 (×6): 10 meq via ORAL
  Filled 2016-03-02 (×8): qty 1

## 2016-03-02 MED ORDER — PROPOFOL 500 MG/50ML IV EMUL
INTRAVENOUS | Status: DC | PRN
  Administered 2016-03-02: 50 ug/kg/min via INTRAVENOUS

## 2016-03-02 MED ORDER — DIPHENHYDRAMINE HCL 12.5 MG/5ML PO ELIX
12.5000 mg | ORAL_SOLUTION | ORAL | Status: DC | PRN
Start: 2016-03-02 — End: 2016-03-05

## 2016-03-02 MED ORDER — TRANEXAMIC ACID 1000 MG/10ML IV SOLN
2000.0000 mg | Freq: Once | INTRAVENOUS | Status: DC
  Filled 2016-03-02: qty 20

## 2016-03-02 MED ORDER — MIDAZOLAM HCL 2 MG/2ML IJ SOLN
INTRAMUSCULAR | Status: DC | PRN
  Administered 2016-03-02 (×2): 1 mg via INTRAVENOUS

## 2016-03-02 MED ORDER — BUPIVACAINE IN DEXTROSE 0.75-8.25 % IT SOLN
INTRATHECAL | Status: DC | PRN
  Administered 2016-03-02: 1.8 mL via INTRATHECAL

## 2016-03-02 MED ORDER — ONDANSETRON HCL 4 MG PO TABS: 4.0000 mg | ORAL_TABLET | Freq: Four times a day (QID) | ORAL | Status: DC | PRN

## 2016-03-02 MED ORDER — HYDROCHLOROTHIAZIDE 25 MG PO TABS: 25.0000 mg | ORAL_TABLET | Freq: Every morning | ORAL | Status: DC

## 2016-03-02 MED ORDER — BUPIVACAINE LIPOSOME 1.3 % IJ SUSP
20.0000 mL | Freq: Once | INTRAMUSCULAR | Status: AC
  Administered 2016-03-02: 20 mL
  Filled 2016-03-02: qty 20

## 2016-03-02 SURGICAL SUPPLY — 52 items
BAG DECANTER FOR FLEXI CONT (MISCELLANEOUS) ×2 IMPLANT
BAG SPEC THK2 15X12 ZIP CLS (MISCELLANEOUS) ×1
BAG ZIPLOCK 12X15 (MISCELLANEOUS) ×2 IMPLANT
BANDAGE ACE 6X5 VEL STRL LF (GAUZE/BANDAGES/DRESSINGS) ×2 IMPLANT
BLADE SAG 18X100X1.27 (BLADE) ×2 IMPLANT
BLADE SAW SGTL 11.0X1.19X90.0M (BLADE) ×2 IMPLANT
BOWL SMART MIX CTS (DISPOSABLE) ×2 IMPLANT
CAP KNEE TOTAL 3 SIGMA ×1 IMPLANT
CEMENT HV SMART SET (Cement) ×4 IMPLANT
CLOSURE STERI-STRIP 1/4X4 (GAUZE/BANDAGES/DRESSINGS) ×2 IMPLANT
CLOTH BEACON ORANGE TIMEOUT ST (SAFETY) ×2 IMPLANT
CUFF TOURN SGL QUICK 34 (TOURNIQUET CUFF) ×2
CUFF TRNQT CYL 34X4X40X1 (TOURNIQUET CUFF) ×1 IMPLANT
DECANTER SPIKE VIAL GLASS SM (MISCELLANEOUS) ×2 IMPLANT
DRAPE U-SHAPE 47X51 STRL (DRAPES) ×2 IMPLANT
DRSG ADAPTIC 3X8 NADH LF (GAUZE/BANDAGES/DRESSINGS) ×2 IMPLANT
DRSG PAD ABDOMINAL 8X10 ST (GAUZE/BANDAGES/DRESSINGS) ×2 IMPLANT
DURAPREP 26ML APPLICATOR (WOUND CARE) ×2 IMPLANT
ELECT REM PT RETURN 9FT ADLT (ELECTROSURGICAL) ×2
ELECTRODE REM PT RTRN 9FT ADLT (ELECTROSURGICAL) ×1 IMPLANT
EVACUATOR 1/8 PVC DRAIN (DRAIN) ×2 IMPLANT
GAUZE SPONGE 4X4 12PLY STRL (GAUZE/BANDAGES/DRESSINGS) ×2 IMPLANT
GLOVE BIO SURGEON STRL SZ7.5 (GLOVE) ×2 IMPLANT
GLOVE BIO SURGEON STRL SZ8 (GLOVE) ×2 IMPLANT
GLOVE BIOGEL PI IND STRL 6.5 (GLOVE) IMPLANT
GLOVE BIOGEL PI IND STRL 8 (GLOVE) ×1 IMPLANT
GLOVE BIOGEL PI INDICATOR 6.5 (GLOVE)
GLOVE BIOGEL PI INDICATOR 8 (GLOVE) ×1
GLOVE SURG SS PI 6.5 STRL IVOR (GLOVE) IMPLANT
GOWN STRL REUS W/TWL LRG LVL3 (GOWN DISPOSABLE) ×2 IMPLANT
GOWN STRL REUS W/TWL XL LVL3 (GOWN DISPOSABLE) ×1 IMPLANT
HANDPIECE INTERPULSE COAX TIP (DISPOSABLE) ×2
IMMOBILIZER KNEE 20 (SOFTGOODS) ×2
IMMOBILIZER KNEE 20 THIGH 36 (SOFTGOODS) ×1 IMPLANT
MANIFOLD NEPTUNE II (INSTRUMENTS) ×2 IMPLANT
NS IRRIG 1000ML POUR BTL (IV SOLUTION) ×2 IMPLANT
PACK TOTAL KNEE CUSTOM (KITS) ×2 IMPLANT
PAD ABD 8X10 STRL (GAUZE/BANDAGES/DRESSINGS) ×1 IMPLANT
PADDING CAST COTTON 6X4 STRL (CAST SUPPLIES) ×5 IMPLANT
POSITIONER SURGICAL ARM (MISCELLANEOUS) ×2 IMPLANT
SET HNDPC FAN SPRY TIP SCT (DISPOSABLE) ×1 IMPLANT
STRIP CLOSURE SKIN 1/2X4 (GAUZE/BANDAGES/DRESSINGS) ×4 IMPLANT
SUT MNCRL AB 4-0 PS2 18 (SUTURE) ×2 IMPLANT
SUT VIC AB 2-0 CT1 27 (SUTURE) ×6
SUT VIC AB 2-0 CT1 TAPERPNT 27 (SUTURE) ×3 IMPLANT
SUT VLOC 180 0 24IN GS25 (SUTURE) ×2 IMPLANT
SYR 50ML LL SCALE MARK (SYRINGE) ×2 IMPLANT
TRAY FOLEY W/METER SILVER 14FR (SET/KITS/TRAYS/PACK) ×2 IMPLANT
TRAY FOLEY W/METER SILVER 16FR (SET/KITS/TRAYS/PACK) ×2 IMPLANT
WATER STERILE IRR 1500ML POUR (IV SOLUTION) ×2 IMPLANT
WRAP KNEE MAXI GEL POST OP (GAUZE/BANDAGES/DRESSINGS) ×2 IMPLANT
YANKAUER SUCT BULB TIP 10FT TU (MISCELLANEOUS) ×2 IMPLANT

## 2016-03-02 NOTE — Anesthesia Preprocedure Evaluation (Signed)
Anesthesia Evaluation  Patient identified by MRN, date of birth, ID band Patient awake    Reviewed: Allergy & Precautions, NPO status , Patient's Chart, lab work & pertinent test results  History of Anesthesia Complications (+) PONV and history of anesthetic complications  Airway Mallampati: II  TM Distance: >3 FB Neck ROM: Full    Dental no notable dental hx. (+) Caps, Partial Upper, Partial Lower, Dental Advisory Given,    Pulmonary asthma , former smoker,    Pulmonary exam normal breath sounds clear to auscultation       Cardiovascular hypertension, Pt. on medications Normal cardiovascular exam Rhythm:Regular Rate:Normal     Neuro/Psych PSYCHIATRIC DISORDERS Anxiety Depression CVA    GI/Hepatic Neg liver ROS, GERD  Medicated,  Endo/Other  negative endocrine ROS  Renal/GU negative Renal ROS  negative genitourinary   Musculoskeletal  (+) Arthritis ,   Abdominal (+) + obese,   Peds negative pediatric ROS (+)  Hematology negative hematology ROS (+)   Anesthesia Other Findings   Reproductive/Obstetrics negative OB ROS                             Anesthesia Physical  Anesthesia Plan  ASA: III  Anesthesia Plan: MAC and Spinal   Post-op Pain Management:    Induction:   Airway Management Planned: Natural Airway and Simple Face Mask  Additional Equipment:   Intra-op Plan:   Post-operative Plan:   Informed Consent:   Plan Discussed with: Anesthesiologist  Anesthesia Plan Comments:         Anesthesia Quick Evaluation

## 2016-03-02 NOTE — Op Note (Signed)
Pre-operative diagnosis- Osteoarthritis  Left knee(s)  Post-operative diagnosis- Osteoarthritis Left knee(s)  Procedure-  Left  Total Knee Arthroplasty  Surgeon- Dione Plover. Maguire Killmer, MD  Assistant- Ardeen Jourdain, PA-C   Anesthesia-  Spinal  EBL-* No blood loss amount entered *   Drains Hemovac  Tourniquet time-  Total Tourniquet Time Documented: Thigh (Left) - 32 minutes Total: Thigh (Left) - 32 minutes     Complications- None  Condition-PACU - hemodynamically stable.   Brief Clinical Note  Marie Harrison is a 75 y.o. year old female with end stage OA of her left knee with progressively worsening pain and dysfunction. She has constant pain, with activity and at rest and significant functional deficits with difficulties even with ADLs. She has had extensive non-op management including analgesics, injections of cortisone and viscosupplements, and home exercise program, but remains in significant pain with significant dysfunction. Radiographs show bone on bone arthritis medial and patellofemoral. She presents now for left Total Knee Arthroplasty.    Procedure in detail---   The patient is brought into the operating room and positioned supine on the operating table. After successful administration of  Spinal,   a tourniquet is placed high on the  Left thigh(s) and the lower extremity is prepped and draped in the usual sterile fashion. Time out is performed by the operating team and then the  Left lower extremity is wrapped in Esmarch, knee flexed and the tourniquet inflated to 300 mmHg.       A midline incision is made with a ten blade through the subcutaneous tissue to the level of the extensor mechanism. A fresh blade is used to make a medial parapatellar arthrotomy. Soft tissue over the proximal medial tibia is subperiosteally elevated to the joint line with a knife and into the semimembranosus bursa with a Cobb elevator. Soft tissue over the proximal lateral tibia is elevated with  attention being paid to avoiding the patellar tendon on the tibial tubercle. The patella is everted, knee flexed 90 degrees and the ACL and PCL are removed. Findings are bone on bone medial and patellofemoral with large global osteophytes.        The drill is used to create a starting hole in the distal femur and the canal is thoroughly irrigated with sterile saline to remove the fatty contents. The 5 degree Left  valgus alignment guide is placed into the femoral canal and the distal femoral cutting block is pinned to remove 10 mm off the distal femur. Resection is made with an oscillating saw.      The tibia is subluxed forward and the menisci are removed. The extramedullary alignment guide is placed referencing proximally at the medial aspect of the tibial tubercle and distally along the second metatarsal axis and tibial crest. The block is pinned to remove 80mm off the more deficient medial  side. Resection is made with an oscillating saw. Size 2.5is the most appropriate size for the tibia and the proximal tibia is prepared with the modular drill and keel punch for that size.      The femoral sizing guide is placed and size 3 is most appropriate. Rotation is marked off the epicondylar axis and confirmed by creating a rectangular flexion gap at 90 degrees. The size 3 cutting block is pinned in this rotation and the anterior, posterior and chamfer cuts are made with the oscillating saw. The intercondylar block is then placed and that cut is made.      Trial size 2.5 tibial component, trial  size 3 posterior stabilized femur and a 12.5  mm posterior stabilized rotating platform insert trial is placed. Full extension is achieved with excellent varus/valgus and anterior/posterior balance throughout full range of motion. The patella is everted and thickness measured to be 22  mm. Free hand resection is taken to 12 mm, a 35 template is placed, lug holes are drilled, trial patella is placed, and it tracks normally.  Osteophytes are removed off the posterior femur with the trial in place. All trials are removed and the cut bone surfaces prepared with pulsatile lavage. Cement is mixed and once ready for implantation, the size 2.5 tibial implant, size  3 posterior stabilized femoral component, and the size 35 patella are cemented in place and the patella is held with the clamp. The trial insert is placed and the knee held in full extension. The Exparel (20 ml mixed with 30 ml saline) and .25% Bupivicaine, are injected into the extensor mechanism, posterior capsule, medial and lateral gutters and subcutaneous tissues.  All extruded cement is removed and once the cement is hard the permanent 12.5 mm posterior stabilized rotating platform insert is placed into the tibial tray.      The wound is copiously irrigated with saline solution and the extensor mechanism closed over a hemovac drain with #1 V-loc suture. The tourniquet is released for a total tourniquet time of 32  minutes. Flexion against gravity is 140 degrees and the patella tracks normally. Subcutaneous tissue is closed with 2.0 vicryl and subcuticular with running 4.0 Monocryl. The incision is cleaned and dried and steri-strips and a bulky sterile dressing are applied. The limb is placed into a knee immobilizer and the patient is awakened and transported to recovery in stable condition.      Please note that a surgical assistant was a medical necessity for this procedure in order to perform it in a safe and expeditious manner. Surgical assistant was necessary to retract the ligaments and vital neurovascular structures to prevent injury to them and also necessary for proper positioning of the limb to allow for anatomic placement of the prosthesis.   Dione Plover Ceri Mayer, MD    03/02/2016, 11:19 AM

## 2016-03-02 NOTE — Evaluation (Signed)
Physical Therapy Evaluation Patient Details Name: Marie Harrison MRN: [id number] DOB: 12-02-1940 Today's Date: 03/02/2016   History of Present Illness  s/p L TKA; PMHx: R TKA  Clinical Impression  Pt is s/p TKA resulting in the deficits listed below (see PT Problem List).  Pt will benefit from skilled PT to increase their independence and safety with mobility to allow discharge to the venue listed below.   Pt is planning at for rehab at [facility]; will continue to follow      Follow Up Recommendations SNF    Equipment Recommendations  None recommended by PT    Recommendations for Other Services       Precautions / Restrictions Precautions Precautions: Knee Required Braces or Orthoses: Knee Immobilizer - Left Knee Immobilizer - Left: Discontinue once straight leg raise with < 10 degree lag Restrictions Other Position/Activity Restrictions: WBAT      Mobility  Bed Mobility Overal bed mobility: Needs Assistance Bed Mobility: Supine to Sit     Supine to sit: Min assist     General bed mobility comments: assist with LLE  Transfers Overall transfer level: Needs assistance Equipment used: Rolling walker (2 wheeled) Transfers: Sit to/from Stand Sit to Stand: Min assist         General transfer comment: cues for hand placement  Ambulation/Gait Ambulation/Gait assistance: Min assist Ambulation Distance (Feet): 12 Feet Assistive device: Rolling walker (2 wheeled) Gait Pattern/deviations: Step-to pattern;Antalgic;Decreased step length - left;Decreased step length - right     General Gait Details: cues for sequence and RW position from self  Stairs            Wheelchair Mobility    Modified Rankin (Stroke Patients Only)       Balance Overall balance assessment: Needs assistance           Standing balance-Leahy Scale: Poor Standing balance comment: reliant on UEs                             Pertinent Vitals/Pain Pain  Assessment: 0-10 Pain Score: 7  Pain Location: left  knee Pain Descriptors / Indicators: Aching;Sore Pain Intervention(s): Limited activity within patient's tolerance;Monitored during session;Repositioned;Ice applied    Home Living Family/patient expects to be discharged to:: Skilled nursing facility Living Arrangements: Spouse/significant other               Additional Comments: plans to go to rehab at [place]"    Prior Function Level of Independence: Independent         Comments: walks only short distances d/t knee pain     Hand Dominance        Extremity/Trunk Assessment   Upper Extremity Assessment: Defer to OT evaluation           Lower Extremity Assessment: LLE deficits/detail   LLE Deficits / Details: hip flexion and knee extension 2+/5; pt reports hx of problems with L foot--grossly WFL for activities tested at this time     Communication   Communication: No difficulties  Cognition Arousal/Alertness: Awake/alert Behavior During Therapy: WFL for tasks assessed/performed Overall Cognitive Status: Within Functional Limits for tasks assessed                      General Comments      Exercises Total Joint Exercises Ankle Circles/Pumps: AROM;Both;5 reps Quad Sets: 5 reps;Both;AROM      Assessment/Plan    PT Assessment Patient needs  continued PT services  PT Diagnosis Difficulty walking   PT Problem List Decreased range of motion;Decreased strength;Decreased activity tolerance;Decreased mobility;Decreased knowledge of use of DME;Decreased knowledge of precautions;Pain  PT Treatment Interventions DME instruction;Gait training;Functional mobility training;Therapeutic activities;Therapeutic exercise;Patient/family education   PT Goals (Current goals can be found in the Care Plan section) Acute Rehab PT Goals Patient Stated Goal: to rehab, more IND PT Goal Formulation: With patient Time For Goal Achievement:  03/06/16 Potential to Achieve Goals: Good    Frequency 7X/week   Barriers to discharge        Co-evaluation               End of Session Equipment Utilized During Treatment: Gait belt Activity Tolerance: Patient tolerated treatment well Patient left: with call bell/phone within reach;in chair;with chair alarm set;with family/visitor present Nurse Communication: Mobility status         Time: [id number] PT Time Calculation (min) (ACUTE ONLY): 19 min   Charges:   PT Evaluation $PT Eval Low Complexity: 1 Procedure     PT G Codes:        Marie Harrison 2016/03/24, 5:00 PM

## 2016-03-02 NOTE — H&P (View-Only) (Signed)
Marie Harrison DOB: Mar 17, 1941 Married / Language: English / Race: White Female Date of Admission:  03/02/2016 CC:  Left Knee Pain History of Present Illness The patient is a 75 year old female who comes in for a preoperative History and Physical. The patient is scheduled for a left total knee arthroplasty to be performed by Dr. Dione Plover. Aluisio, MD at [facility] on 03-02-2016. The patient is being followed for their bilateral knee pain. She is now several month(s) out from knee arthoscopy for patellar clunk on the right total knee that was performed back in august of 2015. The left knee continues to be a problem. The injections did not provide significant releif. Symptoms reported include: pain, swelling (slightly), aching and stiffness. The patient has reported improvement of their symptoms with: viscosupplementation which only helped for a short time. The patient feels that they are doing poorly. The following medication has been used for pain control: antiinflammatory medication (Ibuprofen). Patient states that she has noticed some improvement, but still painful. The right wass more painful in comparison to the left so she had the right knee replaced first. She is now ready to proceed with the left knee repalcement at this time. She has been doing home exercises in preparation for the surgery. They have been treated conservatively in the past for the above stated problem and despite conservative measures, they continue to have progressive pain and severe functional limitations and dysfunction. They have failed non-operative management including home exercise, medications, and injections. It is felt that they would benefit from undergoing total joint replacement. Risks and benefits of the procedure have been discussed with the patient and they elect to proceed with surgery. There are no active contraindications to surgery such as ongoing infection or rapidly progressive neurological  disease.   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 Cipro *Fluoroquinolones**  Itching, Nausea, Vomiting. Sulfa 10 *OPHTHALMIC AGENTS*  Itching, Nausea, Vomiting.  Family History Congestive Heart Failure  Father. father Hypertension  father and grandmother mothers side Heart Disease  father and grandmother mothers side Osteoporosis  mother Depression  grandmother mothers side Cerebrovascular Accident  mother, father and grandmother mothers side Diabetes Mellitus  father and grandmother fathers side Cancer  First Degree Relatives. mother Osteoarthritis  mother and grandmother mothers side  Social History  Current work status  retired Careers information officer  2 Living situation  live with spouse Drug/Alcohol Rehab (Currently)  no Number of flights of stairs before winded  1 Marital status  married Exercise  Exercises rarely; does other Drug/Alcohol Rehab (Previously)  no Alcohol use  current drinker; drinks beer and wine; only occasionally per week Tobacco use  Former smoker. former smoker; smoke(d) less than 1/2 pack(s) per day Tobacco / smoke exposure  no Pain Contract  no Illicit drug use  no Post-Surgical Plans  Home versus Sk[facility]  Medication History  Prolia (60MG /ML Solution, Subcutaneous)  Active. Vitamin D (Oral) Specific strength unknown - Active. Tylenol (325MG  Tablet, Oral) Active. LORazepam (0.5MG  Tablet, QHS PRN Sleep Oral) Active. Triamcinolone Acetonide (0.1% Cream, External) Active. Mucinex (600MG  Tablet ER 12HR, Oral as needed) Active. Claritin (10MG  Capsule, Oral) Active. (loratadine) Aspirin (81MG  Tablet, 1 Oral) Active. Lovastatin (40MG  Tablet, Oral) Active. FLUoxetine HCl (20MG  Capsule, Oral) Active. Lisinopril (40MG  Tablet, Oral) Active. (80 mg per day) Potassium Chloride CR (10MEQ Capsule ER, Oral) Active. Omeprazole (40MG  Capsule DR, Oral) Active. (prn) Meloxicam (7.5MG  Tablet, Oral) Active.   Past Surgical History Foot Surgery  right Dilation and Curettage of Uterus  Arthroscopic Knee Surgery - Right  Arthroscopy of Knee  Left Tonsillectomy  Cataract Surgery  Bilateral Total Knee Replacement - Right    Review of Systems General Not Present- Chills, Fatigue, Fever, Memory Loss, Night Sweats, Weight Gain and Weight Loss. Skin Not Present- Eczema, Hives, Itching, Lesions and Rash. HEENT Present- Tinnitus. Not Present- Dentures, Double Vision, Headache, Hearing Loss and Visual Loss. Respiratory Not Present- Allergies, Chronic Cough, Coughing up blood, Shortness of breath at rest and Shortness of breath with exertion. Cardiovascular Not Present- Chest Pain, Difficulty Breathing Lying Down, Murmur, Palpitations, Racing/skipping heartbeats and Swelling. Gastrointestinal Not Present- Abdominal Pain, Bloody Stool, Constipation, Diarrhea, Difficulty Swallowing, Heartburn, Jaundice, Loss of appetitie, Nausea and Vomiting. Female Genitourinary Present- Urinating at Night. Not Present- Blood in Urine, Discharge, Flank Pain, Incontinence, Painful Urination, Urgency, Urinary frequency, Urinary Retention and Weak urinary stream. Musculoskeletal Present- Back Pain, Joint  Pain, Joint Swelling, Morning Stiffness and Muscle Pain. Not Present- Muscle Weakness and Spasms. Neurological Not Present- Blackout spells, Difficulty with balance, Dizziness, Paralysis, Tremor and Weakness. Psychiatric Not Present- Insomnia.  Vitals Weight: 165 lb Height: 61in Weight was reported by patient. Height was reported by patient. Body Surface Area: 1.74 m Body Mass Index: 31.18 kg/m  Pulse: 72 (Regular)  BP: 128/68 (Sitting, Right Arm, Standard)  Physical Exam  General Mental Status -Alert, cooperative and good historian. General Appearance-pleasant, Not in acute distress. Orientation-Oriented X3. Build & Nutrition-Well nourished and Well developed.  Head and Neck Head-normocephalic, atraumatic . Neck Global Assessment - supple, no bruit auscultated on the right, no bruit auscultated on the left. Note: upper partial and lower partial dentures   Eye Pupil - Bilateral-Regular and Round. Motion - Bilateral-EOMI.  Chest and Lung Exam Auscultation Breath sounds - clear at anterior chest wall and clear at posterior chest wall. Adventitious sounds - No Adventitious sounds.  Cardiovascular Auscultation Rhythm - Regular rate and rhythm. Heart Sounds - S1 WNL and S2 WNL. Murmurs & Other Heart Sounds - Auscultation of the heart reveals - No Murmurs.  Abdomen Palpation/Percussion Tenderness - Abdomen is non-tender to palpation. Rigidity (guarding) - Abdomen is soft. Auscultation Auscultation of the abdomen reveals - Bowel sounds normal.  Female Genitourinary Note: Not done, not pertinent to present illness   Musculoskeletal Note: She is in no acute distress. Alert and oriented x3. Symmetric respiratory excursion. Examination of the left knee reveals no skin wounds or lesions. She has a varus deformity. She does have some swelling. No erythema, no effusion. She has tenderness to palpation of the hamstring tendons and onto the pes anserine  insertion. Range of motion is roughly 10 to 80 degrees. There is no ligamentous instability. She has painless range of motion of the hip distally. No focal motor or sensory deficit. She has palpable pedal pulses.  DIAGNOSTIC TESTS Weightbearing AP, PA, flexion, merchant view of bilateral knees, lateral view of the left knee. She  has a cemented right total knee arthroplasty in excellent alignment. On the left side, she has bone-on-bone medial compartment arthritis, varus deformity. She does have some patellofemoral arthritic change as well.   Assessment & Plan  Localized osteoarthritis of left knee (M17.12)  Note:Surgical Plans: Left Right Total Knee Hip Replacement - Anterior Approach  Disposition: Lives at [facility]  PCP: Merilynn Finland  Topical TXA - History of Stroke  Anesthesia Issues: None  Signed electronically by Ok Edwards, III PA-C

## 2016-03-02 NOTE — Anesthesia Postprocedure Evaluation (Signed)
Anesthesia Post Note  Patient: Marie Harrison  Procedure(s) Performed: Procedure(s) (LRB): TOTAL LEFT KNEE ARTHROPLASTY (Left)  Patient location during evaluation: PACU Anesthesia Type: Spinal and MAC Level of consciousness: awake and alert Pain management: pain level controlled Vital Signs Assessment: post-procedure vital signs reviewed and stable Respiratory status: spontaneous breathing and respiratory function stable Cardiovascular status: blood pressure returned to baseline and stable Postop Assessment: spinal receding Anesthetic complications: no    Last Vitals:  Filed Vitals:   03/02/16 1215 03/02/16 1230  BP: 160/77 151/92  Pulse: 77 71  Temp:  36.4 C  Resp: 21 14    Last Pain:  Filed Vitals:   03/02/16 1233  PainSc: 0-No pain    LLE Motor Response: Purposeful movement (03/02/16 1230) LLE Sensation: Tingling (03/02/16 1230) RLE Motor Response: Purposeful movement (03/02/16 1230) RLE Sensation: Tingling (03/02/16 1230) L Sensory Level: L4-Anterior knee, lower leg (03/02/16 1230) R Sensory Level: L4-Anterior knee, lower leg (03/02/16 1230)  [place]

## 2016-03-02 NOTE — Anesthesia Procedure Notes (Signed)
Spinal Patient location during procedure: OR End time: 03/02/2016 10:21 AM Staffing Resident/CRNA: Cynda Familia Performed by: resident/CRNA  Preanesthetic Checklist Completed: patient identified, site marked, surgical consent, pre-op evaluation, timeout performed, IV checked, risks and benefits discussed and monitors and equipment checked Spinal Block Patient position: sitting Prep: ChloraPrep and site prepped and draped Patient monitoring: heart rate, continuous pulse ox, blood pressure and cardiac monitor Approach: midline Location: L3-4 Injection technique: single-shot Needle Needle type: Sprotte  Needle gauge: 24 G Assessment Sensory level: T6 Additional Notes Expiration date of tray noted and within date.   Patient tolerated procedure well. Good CSF flow prior to injection and after- Tobias Alexander present supervised procedure

## 2016-03-02 NOTE — Transfer of Care (Signed)
Immediate Anesthesia Transfer of Care Note  Patient: Marie Harrison  Procedure(s) Performed: Procedure(s): TOTAL LEFT KNEE ARTHROPLASTY (Left)  Patient Location: PACU  Anesthesia Type:Spinal  Level of Consciousness: awake, alert  and oriented  Airway & Oxygen Therapy: Patient Spontanous Breathing and Patient connected to face mask oxygen  Post-op Assessment: Report given to RN and Post -op Vital signs reviewed and stable  Post vital signs: Reviewed and stable  Last Vitals:  Filed Vitals:   03/02/16 0733  BP: 155/78  Pulse: 80  Temp: 36.9 C  Resp: 18    Last Pain: There were no vitals filed for this visit.       Complications: No apparent anesthesia complications

## 2016-03-02 NOTE — Addendum Note (Signed)
Addendum  created 03/02/16 1249 by Cynda Familia, CRNA   Modules edited: Anesthesia Medication Administration

## 2016-03-02 NOTE — Interval H&P Note (Signed)
History and Physical Interval Note:  03/02/2016 9:25 AM  Marie Harrison  has presented today for surgery, with the diagnosis of LEFT KNEE OSTEOARTHRITIS   The various methods of treatment have been discussed with the patient and family. After consideration of risks, benefits and other options for treatment, the patient has consented to  Procedure(s): TOTAL LEFT KNEE ARTHROPLASTY (Left) as a surgical intervention .  The patient's history has been reviewed, patient examined, no change in status, stable for surgery.  I have reviewed the patient's chart and labs.  Questions were answered to the patient's satisfaction.     Gearlean Alf

## 2016-03-03 LAB — CBC
HEMATOCRIT: 31.1 % — AB (ref 36.0–46.0)
HEMOGLOBIN: 10.5 g/dL — AB (ref 12.0–15.0)
MCH: 32 pg (ref 26.0–34.0)
MCHC: 33.8 g/dL (ref 30.0–36.0)
MCV: 94.8 fL (ref 78.0–100.0)
Platelets: 283 10*3/uL (ref 150–400)
RBC: 3.28 MIL/uL — ABNORMAL LOW (ref 3.87–5.11)
RDW: 15.8 % — ABNORMAL HIGH (ref 11.5–15.5)
WBC: 17.4 10*3/uL — AB (ref 4.0–10.5)

## 2016-03-03 LAB — BASIC METABOLIC PANEL
ANION GAP: 6 (ref 5–15)
BUN: 13 mg/dL (ref 6–20)
CALCIUM: 8.2 mg/dL — AB (ref 8.9–10.3)
CHLORIDE: 95 mmol/L — AB (ref 101–111)
CO2: 28 mmol/L (ref 22–32)
CREATININE: 0.44 mg/dL (ref 0.44–1.00)
GFR calc non Af Amer: >60 mL/min (ref >60)
Glucose, Bld: 127 mg/dL — ABNORMAL HIGH (ref 65–99)
Potassium: 4 mmol/L (ref 3.5–5.1)
SODIUM: 129 mmol/L — AB (ref 135–145)

## 2016-03-03 MED ORDER — TRAMADOL HCL 50 MG PO TABS: 50.0000 mg | ORAL_TABLET | Freq: Four times a day (QID) | ORAL | Status: DC | PRN

## 2016-03-03 MED ORDER — METHOCARBAMOL 500 MG PO TABS: 500.0000 mg | ORAL_TABLET | Freq: Four times a day (QID) | ORAL | Status: DC | PRN

## 2016-03-03 MED ORDER — RIVAROXABAN 10 MG PO TABS: 10.0000 mg | ORAL_TABLET | Freq: Every day | ORAL | Status: DC

## 2016-03-03 MED ORDER — METOCLOPRAMIDE HCL 5 MG PO TABS: 5.0000 mg | ORAL_TABLET | Freq: Three times a day (TID) | ORAL | Status: DC | PRN

## 2016-03-03 MED ORDER — DOCUSATE SODIUM 100 MG PO CAPS: 100.0000 mg | ORAL_CAPSULE | Freq: Two times a day (BID) | ORAL | Status: DC

## 2016-03-03 MED ORDER — FLEET ENEMA 7-19 GM/118ML RE ENEM: 1.0000 | ENEMA | Freq: Once | RECTAL | Status: DC | PRN

## 2016-03-03 MED ORDER — DIPHENHYDRAMINE HCL 12.5 MG/5ML PO ELIX: 12.5000 mg | ORAL_SOLUTION | ORAL | Status: DC | PRN

## 2016-03-03 MED ORDER — POLYETHYLENE GLYCOL 3350 17 G PO PACK: 17.0000 g | PACK | Freq: Every day | ORAL | Status: DC | PRN

## 2016-03-03 MED ORDER — HYDROMORPHONE HCL 2 MG PO TABS: 2.0000 mg | ORAL_TABLET | ORAL | Status: DC | PRN

## 2016-03-03 MED ORDER — ONDANSETRON HCL 4 MG PO TABS: 4.0000 mg | ORAL_TABLET | Freq: Four times a day (QID) | ORAL | Status: DC | PRN

## 2016-03-03 NOTE — Progress Notes (Signed)
Physical Therapy Treatment Patient Details Name: Marie Harrison MRN: [id number] DOB: 1941-08-06 Today's Date: 03/03/2016    History of Present Illness s/p L TKA; PMHx: R TKA    PT Comments    POD # 1 am session OT reports pt's cognition is impaired and required extra instruction.  Applied KI and instructed on use for amb.  Assisted out of recliner to amb a limited distance in hallway due to unsteadiness and feeling "woozy".  Recliner brought to pt.  Returned to room and assisted with TKR TE's.  Several times, pt thought she was still at [facility].  Easily oriented.  Sleepy/Groggy.  ICE applied to knee.  Follow Up Recommendations  SNF ([facility])     Equipment Recommendations       Recommendations for Other Services       Precautions / Restrictions Precautions Precautions: Knee Precaution Comments: instructed on KI use for amb  Knee Immobilizer - Left: Discontinue once straight leg raise with < 10 degree lag Restrictions Weight Bearing Restrictions: No Other Position/Activity Restrictions: WBAT    Mobility  Bed Mobility               General bed mobility comments: Pt OOB in recliner  Transfers Overall transfer level: Needs assistance Equipment used: Rolling walker (2 wheeled) Transfers: Sit to/from Stand Sit to Stand: Mod assist;From elevated surface         General transfer comment: cues for hand placement and safety with turn completion  Ambulation/Gait Ambulation/Gait assistance: Min assist;Mod assist Ambulation Distance (Feet): 8 Feet Assistive device: Rolling walker (2 wheeled) Gait Pattern/deviations: Step-to pattern;Decreased stance time - left Gait velocity: decreased   General Gait Details: cues for sequence and RW position from self.  Unsteady.  drowsy.  Mild c/o feeling "woozy".   Stairs            Wheelchair Mobility    Modified Rankin (Stroke Patients Only)       Balance                                     Cognition Arousal/Alertness: Awake/alert (drowzy/sleepy/disoriented easily)   Overall Cognitive Status: Impaired/Different from baseline                 General Comments: suspect due to medications    Exercises   Total Knee Replacement TE's 10 reps B LE ankle pumps 10 reps towel squeezes 10 reps knee presses 10 reps heel slides  10 reps SAQ's 10 reps SLR's 10 reps ABD Followed by ICE     General Comments        Pertinent Vitals/Pain Pain Assessment: 0-10 Pain Score: 3  Pain Location: L knee Pain Descriptors / Indicators: Aching;Sore Pain Intervention(s): Monitored during session;Premedicated before session;Repositioned;Ice applied    Home Living                      Prior Function            PT Goals (current goals can now be found in the care plan section) Progress towards PT goals: Progressing toward goals    Frequency  7X/week    PT Plan Current plan remains appropriate    Co-evaluation             End of Session Equipment Utilized During Treatment: Gait belt Activity Tolerance: Other (comment) (limited due to level of cognition plus drowsy) Patient left:  in chair;with call bell/phone within reach;with family/visitor present;with chair alarm set     Time: 1110-1135 PT Time Calculation (min) (ACUTE ONLY): 25 min  Charges:  $Gait Training: 8-22 mins $Therapeutic Exercise: 8-22 mins                    G Codes:      Rica Koyanagi  PTA [facility]  Acute  Rehab Pager      [phone]

## 2016-03-03 NOTE — Progress Notes (Signed)
   Subjective: 1 Day Post-Op Procedure(s) (LRB): TOTAL LEFT KNEE ARTHROPLASTY (Left) Patient reports pain as moderate.   Patient seen in rounds with Dr. Wynelle Link.  Rough night.  Biggest complaint was not much sleep. Patient is well, but has had some minor complaints of pain in the knee, requiring pain medications We will resume therapy today. She walked 12 feet day of surgery with PT Plan is to go Home after hospital stay.  Objective: Vital signs in last 24 hours: Temp:  [97.4 F (36.3 C)-97.7 F (36.5 C)] 97.7 F (36.5 C) (06/27 0517) Pulse Rate:  [59-85] 72 (06/27 0620) Resp:  [14-21] 18 (06/27 0620) BP: (120-160)/(47-92) 120/56 mmHg (06/27 0517) SpO2:  [95 %-100 %] 98 % (06/27 0620)  Intake/Output from previous day:  Intake/Output Summary (Last 24 hours) at 03/03/16 0813 Last data filed at 03/03/16 0753  Gross per 24 hour  Intake   4795 ml  Output   4115 ml  Net    680 ml    Intake/Output this shift: Total I/O In: 240 [P.O.:240] Out: -   Labs:  Recent Labs  03/03/16 0426  HGB 10.5*    Recent Labs  03/03/16 0426  WBC 17.4*  RBC 3.28*  HCT 31.1*  PLT 283    Recent Labs  03/03/16 0426  NA 129*  K 4.0  CL 95*  CO2 28  BUN 13  CREATININE 0.44  GLUCOSE 127*  CALCIUM 8.2*   No results for input(s): LABPT, INR in the last 72 hours.  EXAM General - Patient is Alert, Appropriate and Oriented Extremity - Neurovascular intact Sensation intact distally Dorsiflexion/Plantar flexion intact Dressing - dressing C/D/I Motor Function - intact, moving foot and toes well on exam.  Hemovac pulled without difficulty.  Past Medical History  Diagnosis Date  . Depression   . GERD (gastroesophageal reflux disease)   . Hyperlipidemia   . Hypertension   . Arthritis   . Anxiety   . History of CVA (cerebrovascular accident)     Remote CVA per ct 2009    . Allergic rhinitis   . SUI (stress urinary incontinence, female)   . History of basal cell carcinoma  excision     x6  face/ head  . Patellar clunk syndrome of right knee   . History of recurrent UTI (urinary tract infection)   . Wears partial dentures     upper and lower  . IBS (irritable bowel syndrome)   . PONV (postoperative nausea and vomiting)   . Fibromyalgia     Assessment/Plan: 1 Day Post-Op Procedure(s) (LRB): TOTAL LEFT KNEE ARTHROPLASTY (Left) Active Problems:   OA (osteoarthritis) of knee  Estimated body mass index is 31.19 kg/(m^2) as calculated from the following:   Height as of this encounter: 5\' 1"  (1.549 m).   Weight as of this encounter: 74.844 kg (165 lb). Advance diet Up with therapy Plan for discharge tomorrow Discharge home with home health  Lives at [facility].  DVT Prophylaxis - Xarelto Weight-Bearing as tolerated to left leg D/C O2 and Pulse OX and try on Room Air  Arlee Muslim, PA-C Orthopaedic Surgery 03/03/2016, 8:13 AM

## 2016-03-03 NOTE — Progress Notes (Signed)
Occupational Therapy Evaluation Patient Details Name: Marie Harrison MRN: [id number] DOB: Jul 01, 1941 Today's Date: 03/03/2016    History of Present Illness s/p L TKA; PMHx: R TKA   Clinical Impression   Patient presents to OT with decreased ADL independence and safety due to the deficits listed below. She will benefit from skilled OT to maximize function and to facilitate a safe discharge. OT will follow.    Follow Up Recommendations  SNF    Equipment Recommendations  Other (comment) (tbd next venue)    Recommendations for Other Services       Precautions / Restrictions Precautions Precautions: Knee Required Braces or Orthoses: Knee Immobilizer - Left Knee Immobilizer - Left: Discontinue once straight leg raise with < 10 degree lag Restrictions Weight Bearing Restrictions: No Other Position/Activity Restrictions: WBAT      Mobility Bed Mobility Overal bed mobility: Needs Assistance Bed Mobility: Supine to Sit     Supine to sit: Min assist     General bed mobility comments: assist with LLE  Transfers Overall transfer level: Needs assistance Equipment used: Rolling walker (2 wheeled) Transfers: Sit to/from Stand Sit to Stand: Mod assist;From elevated surface         General transfer comment: cues for hand placement    Balance                                            ADL Overall ADL's : Needs assistance/impaired     Grooming: Wash/dry hands;Set up;Sitting                   Toilet Transfer: Moderate assistance;Stand-pivot;BSC;RW   Toileting- Clothing Manipulation and Hygiene: Moderate assistance;Sit to/from stand       Functional mobility during ADLs: Moderate assistance;Minimal assistance;Rolling walker General ADL Comments: Patient practiced bed mobility, transfer to BSC/toileting, then up to recliner during session. Patient lost her balance posteriorly multiple times and required constant cues on how to perform basic  mobility tasks.      Vision     Perception     Praxis      Pertinent Vitals/Pain Pain Assessment: 0-10 Pain Score: 8  Pain Location: L knee Pain Descriptors / Indicators: Aching;Sore     Hand Dominance     Extremity/Trunk Assessment Upper Extremity Assessment Upper Extremity Assessment: Overall WFL for tasks assessed   Lower Extremity Assessment Lower Extremity Assessment: Defer to PT evaluation       Communication Communication Communication: No difficulties   Cognition Arousal/Alertness: Awake/alert Behavior During Therapy: WFL for tasks assessed/performed Overall Cognitive Status: Impaired/Different from baseline Area of Impairment: Memory;Safety/judgement     Memory: Decreased recall of precautions;Decreased short-term memory   Safety/Judgement: Decreased awareness of safety     General Comments: suspect due to medications   General Comments       Exercises       Shoulder Instructions      Home Living                                   Additional Comments: plans to go to rehab at [place]"      Prior Functioning/Environment Level of Independence: Independent        Comments: walks only short distances d/t knee pain    OT Diagnosis: Acute pain   OT  Problem List: Decreased strength;Decreased range of motion;Decreased activity tolerance;Impaired balance (sitting and/or standing);Decreased safety awareness;Decreased knowledge of use of DME or AE;Pain   OT Treatment/Interventions: Self-care/ADL training;DME and/or AE instruction;Therapeutic activities;Patient/family education    OT Goals(Current goals can be found in the care plan section) Acute Rehab OT Goals Patient Stated Goal: to rehab, more IND OT Goal Formulation: With patient Time For Goal Achievement: 03/17/16 Potential to Achieve Goals: Good ADL Goals Pt Will Perform Upper Body Bathing: with set-up;with supervision;sitting Pt Will Perform Lower Body  Bathing: with supervision;sit to/from stand Pt Will Perform Upper Body Dressing: with set-up;with supervision;sitting Pt Will Perform Lower Body Dressing: with supervision;sit to/from stand Pt Will Transfer to Toilet: with supervision;ambulating;bedside commode Pt Will Perform Toileting - Clothing Manipulation and hygiene: with supervision;sit to/from stand  OT Frequency: Min 2X/week   Barriers to D/C:            Co-evaluation              End of Session Equipment Utilized During Treatment: Rolling walker;Left knee immobilizer  Activity Tolerance: Patient tolerated treatment well Patient left: in chair;with call bell/phone within reach;with chair alarm set   Time: [id number] OT Time Calculation (min): 28 min Charges:  OT General Charges $OT Visit: 1 Procedure OT Evaluation $OT Eval Moderate Complexity: 1 Procedure OT Treatments $Self Care/Home Management : 8-22 mins G-Codes:    Marie Harrison A 2016/03/08, 12:45 PM

## 2016-03-03 NOTE — Progress Notes (Signed)
Physical Therapy Treatment Patient Details Name: Marie Harrison MRN: [id number] DOB: 1941/06/03 Today's Date: 03/03/2016    History of Present Illness s/p L TKA; PMHx: R TKA    PT Comments    POD # 1 pm session. RN and NT in room pt in bed on 2 lts.  Applied KI and assisted OOB to [facility] to void.  Assisted with hygiene as pt was unable to self perform and maintain balance.  Assisted with amb a greater distance but still limited by c/o feeling "woozy".  Assisted back to bed and applied ICE.   Pt will need ST Rehab at SNF prior to returning to [facility].   Follow Up Recommendations  SNF [facility])     Equipment Recommendations       Recommendations for Other Services       Precautions / Restrictions Precautions Precautions: Knee Precaution Comments: instructed on KI use for amb  Knee Immobilizer - Left: Discontinue once straight leg raise with < 10 degree lag Restrictions Weight Bearing Restrictions: No Other Position/Activity Restrictions: WBAT    Mobility  Bed Mobility Overal bed mobility: Needs Assistance Bed Mobility: Supine to Sit;Sit to Supine     Supine to sit: Min assist Sit to supine: Min assist   General bed mobility comments: Min Assist in/OOB with increased time.  Transfers Overall transfer level: Needs assistance Equipment used: Rolling walker (2 wheeled) Transfers: Sit to/from Stand Sit to Stand: Mod assist;From elevated surface         General transfer comment: cues for hand placement and safety with turn completion  Ambulation/Gait Ambulation/Gait assistance: Min assist;Mod assist Ambulation Distance (Feet): 18 Feet Assistive device: Rolling walker (2 wheeled) Gait Pattern/deviations: Step-to pattern;Decreased stance time - left Gait velocity: decreased   General Gait Details: Remained on 2 lts to achieve sats avg 94%.  Still "woozy".  BP 134/78 and HR 78.  Used 2nd assist for safety.  Unsteady.  50% VC's for direction  and asafety with turn completion.     Stairs            Wheelchair Mobility    Modified Rankin (Stroke Patients Only)       Balance                                    Cognition Arousal/Alertness: Awake/alert (drowzy/sleepy/disoriented easily)   Overall Cognitive Status: Impaired/Different from baseline                 General Comments: suspect due to medications    Exercises      General Comments        Pertinent Vitals/Pain Pain Assessment: 0-10 Pain Score: 3  Pain Location: L knee Pain Descriptors / Indicators: Aching;Sore Pain Intervention(s): Monitored during session;Premedicated before session;Repositioned;Ice applied    Home Living                      Prior Function            PT Goals (current goals can now be found in the care plan section) Progress towards PT goals: Progressing toward goals    Frequency  7X/week    PT Plan Current plan remains appropriate    Co-evaluation             End of Session Equipment Utilized During Treatment: Gait belt Activity Tolerance: Other (comment) (limited due to level of  cognition plus drowsy) Patient left: in chair;with call bell/phone within reach;with family/visitor present;with chair alarm set     Time: [id number] PT Time Calculation (min) (ACUTE ONLY): 35 min  Charges:  $Gait Training: 8-22 mins $Therapeutic Activity: 8-22 mins                    G Codes:      Rica Koyanagi  PTA [facility]  Acute  Rehab Pager      [phone]

## 2016-03-03 NOTE — NC FL2 (Signed)
[place] LEVEL OF CARE SCREENING TOOL     IDENTIFICATION  Patient Name: Marie Harrison Birthdate: 07/23/1941 Sex: female Admission Date (Current Location): 03/02/2016  [facility] [place] Number:  Herbalist and Address:  [facility],  [place] [address] [place]      Provider Number: [id number]  Attending Physician Name and Address:  Gaynelle Arabian, MD  Relative Name and Phone Number:       Current Level of Care: Hospital Recommended Level of Care: [place] Prior Approval Number:    Date Approved/Denied:   PASRR Number: [id number]  Discharge Plan: SNF    Current Diagnoses: Patient Active Problem List   Diagnosis Date Noted  . Pain in joint, ankle and foot 11/26/2015  . 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 Hyponatremia 05/01/2014  . Hypokalemia 05/01/2014  . OA (osteoarthritis) of knee 04/30/2014  . Dyshidrotic eczema 01/23/2014  . Acute medial meniscal tear 12/06/2012  . Allergic rhinitis due to pollen 02/11/2012  . Osteoarthritis 02/11/2012  . Viral URI 11/02/2011  . Asthma, mild 11/02/2011  . LEUKOPLAKIA, ORAL MUCOSA 03/18/2010  . CONTACT DERMATITIS&OTHER ECZEMA DUE DETERGENTS 10/01/2009  . Hyperlipidemia 08/06/2009  . Depression 08/06/2009  . Essential hypertension 08/06/2009  . GERD 08/06/2009  . URINARY INCONTINENCE 08/06/2009  . CEREBROVASCULAR ACCIDENT, HX OF 08/06/2009    Orientation RESPIRATION BLADDER Height & Weight     Self, Time, Situation, Place  O2 Continent Weight: 74.844 kg (165 lb) Height:  5\' 1"  (154.9 cm)  BEHAVIORAL SYMPTOMS/MOOD NEUROLOGICAL BOWEL NUTRITION STATUS  Other (Comment) (no behaviors)   Continent Diet  AMBULATORY STATUS COMMUNICATION OF NEEDS Skin   Limited Assist Verbally Surgical wounds                       Personal Care Assistance Level of Assistance  Bathing, Feeding, Dressing Bathing Assistance: Limited  assistance Feeding assistance: Independent Dressing Assistance: Limited assistance     Functional Limitations Info  Sight, Hearing, Speech Sight Info: Adequate Hearing Info: Adequate Speech Info: Adequate    SPECIAL CARE FACTORS FREQUENCY  PT (By licensed PT), OT (By licensed OT)     PT Frequency: 5-6 x wk OT Frequency: 5-6 x wk            Contractures Contractures Info: Not present    Additional Factors Info  Code Status Code Status Info: FULL             Current Medications (03/03/2016):  This is the current hospital active medication list Current Facility-Administered Medications  Medication Dose Route Frequency Provider Last Rate Last Dose  . 0.9 %  sodium chloride infusion   Intravenous Continuous Arlee Muslim, PA-C   Stopped at 03/03/16 1020  . acetaminophen (TYLENOL) tablet 650 mg  650 mg Oral Q6H PRN Gaynelle Arabian, MD       Or  . acetaminophen (TYLENOL) suppository 650 mg  650 mg Rectal Q6H PRN Gaynelle Arabian, MD      . acetaminophen (TYLENOL) tablet 1,000 mg  1,000 mg Oral Q6H Gaynelle Arabian, MD   1,000 mg at 03/03/16 0620  . bisacodyl (DULCOLAX) suppository 10 mg  10 mg Rectal Daily PRN Gaynelle Arabian, MD      . diphenhydrAMINE (BENADRYL) 12.5 MG/5ML elixir 12.5-25 mg  12.5-25 mg Oral Q4H PRN Gaynelle Arabian, MD      . docusate sodium (COLACE) capsule 100 mg  100 mg Oral BID Gaynelle Arabian,  MD   100 mg at 03/03/16 0808  . FLUoxetine (PROZAC) capsule 20 mg  20 mg Oral q morning - 10a Gaynelle Arabian, MD   20 mg at 03/03/16 0807  . HYDROmorphone (DILAUDID) injection 0.5 mg  0.5 mg Intravenous Q2H PRN Gaynelle Arabian, MD   0.5 mg at 03/02/16 2037  . HYDROmorphone (DILAUDID) tablet 2-4 mg  2-4 mg Oral Q4H PRN Gaynelle Arabian, MD   4 mg at 03/03/16 0620  . ipratropium (ATROVENT) 0.03 % nasal spray 2 spray  2 spray Each Nare BID Gaynelle Arabian, MD   2 spray at 03/02/16 1646  . loratadine (CLARITIN) tablet 10 mg  10 mg Oral Daily PRN Gaynelle Arabian, MD      . LORazepam (ATIVAN)  tablet 0.25 mg  0.25 mg Oral QHS Gaynelle Arabian, MD   0.25 mg at 03/02/16 2118  . menthol-cetylpyridinium (CEPACOL) lozenge 3 mg  1 lozenge Oral PRN Gaynelle Arabian, MD       Or  . phenol (CHLORASEPTIC) mouth spray 1 spray  1 spray Mouth/Throat PRN Gaynelle Arabian, MD      . methocarbamol (ROBAXIN) tablet 500 mg  500 mg Oral Q6H PRN Gaynelle Arabian, MD       Or  . methocarbamol (ROBAXIN) 500 mg in dextrose 5 % 50 mL IVPB  500 mg Intravenous Q6H PRN Gaynelle Arabian, MD   500 mg at 03/02/16 2036  . metoCLOPramide (REGLAN) tablet 5-10 mg  5-10 mg Oral Q8H PRN Gaynelle Arabian, MD       Or  . metoCLOPramide (REGLAN) injection 5-10 mg  5-10 mg Intravenous Q8H PRN Gaynelle Arabian, MD      . ondansetron [facility]) tablet 4 mg  4 mg Oral Q6H PRN Gaynelle Arabian, MD       Or  . ondansetron [facility]) injection 4 mg  4 mg Intravenous Q6H PRN Gaynelle Arabian, MD   4 mg at 03/03/16 0546  . pantoprazole (PROTONIX) EC tablet 80 mg  80 mg Oral Daily Gaynelle Arabian, MD   80 mg at 03/03/16 0807  . polyethylene glycol (MIRALAX / GLYCOLAX) packet 17 g  17 g Oral Daily PRN Gaynelle Arabian, MD      . potassium chloride (K-DUR,KLOR-CON) CR tablet 10 mEq  10 mEq Oral BID Gaynelle Arabian, MD   10 mEq at 03/03/16 0807  . pravastatin (PRAVACHOL) tablet 40 mg  40 mg Oral q1800 Gaynelle Arabian, MD   40 mg at 03/02/16 1646  . rivaroxaban (XARELTO) tablet 10 mg  10 mg Oral Q breakfast Gaynelle Arabian, MD   10 mg at 03/03/16 0807  . sodium phosphate (FLEET) 7-19 GM/118ML enema 1 enema  1 enema Rectal Once PRN Gaynelle Arabian, MD      . traMADol [facility]at[facility]) tablet 50-100 mg  50-100 mg Oral Q6H PRN Gaynelle Arabian, MD   100 mg at 03/02/16 2036     Discharge Medications: Please see discharge summary for a list of discharge medications.  Relevant Imaging Results:  Relevant Lab Results:   Additional Information ss # [id number]  Tye Vigo, Randall An, LCSW

## 2016-03-03 NOTE — Clinical Social Work Note (Signed)
Clinical Social Work Assessment  Patient Details  Name: Marie Harrison MRN: [id number] Date of Birth: August 20, 1941  Date of referral:  03/03/16               Reason for consult:  Discharge Planning                Permission sought to share information with:  Chartered certified accountant granted to share information::  Yes, Verbal Permission Granted  Name::        Agency::     Relationship::     Contact Information:     Housing/Transportation Living arrangements for the past 2 months:  Apartment, [place] of Information:  Patient, Spouse, Facility Patient Interpreter Needed:  None Criminal Activity/Legal Involvement Pertinent to Current Situation/Hospitalization:  No - Comment as needed Significant Relationships:    Lives with:  Spouse Do you feel safe going back to the place where you live?  No (Pt feels SNF is needed.) Need for family participation in patient care:  Yes (Comment)  Care giving concerns:  Pt feels she needs more care than is available at home following hospital d/c.   Social Worker assessment / plan:  Pt hospitalized on 03/02/16 for pre planned left total knee arthroplasty. Pt is part of medicare bundle program. CSW met with pt/spouse and spoke with [facility] from [place] coordinator from [place]. Pt is from [place] and would like to have ST Rehab at facility prior to returning to her independent apt. If pt doesn't qualify for ST Rehab at d/c, and does not feel comfortable returning home with [facility] services, pt can use her " grace days " to cover cost of rehab stay. MD will see pt in the am and disposition will be determined at that time. CSW will continue to follow to assist with d/c planning needs.  Employment status:  Retired Forensic scientist:  Medicare PT Recommendations:  [place] / Referral to community resources:  [place]  Patient/Family's Response to care:  Disposition has not yet been determined : home vs ST Rehab vs grace days at [employer]  Patient/Family's Understanding of and Emotional Response to Diagnosis, Current Treatment, and Prognosis:  Pt / spouse are aware of pt's medical status. PT is hoping she will qualify for ST Rehab at [facility].  Emotional Assessment Appearance:  Appears stated age Attitude/Demeanor/Rapport:  Other (cooperative) Affect (typically observed):  Calm, Appropriate Orientation:  Oriented to Self, Oriented to Place, Oriented to  Time, Oriented to Situation Alcohol / Substance use:  Not Applicable Psych involvement (Current and /or in the community):  No (Comment)  Discharge Needs  Concerns to be addressed:  Discharge Planning Concerns Readmission within the last 30 days:  No Current discharge risk:  None Barriers to Discharge:  No Barriers Identified   [name], Gamal[place] 03/03/2016, 1:48 PM

## 2016-03-03 NOTE — Discharge Instructions (Addendum)
° °Dr. Frank Aluisio °Total Joint Specialist °[name] °[address] °[place], [place] [address] °[phone] ° °TOTAL KNEE REPLACEMENT POSTOPERATIVE DIRECTIONS ° °Knee Rehabilitation, Guidelines Following Surgery  °Results after knee surgery are often greatly improved when you follow the exercise, range of motion and muscle strengthening exercises prescribed by your doctor. Safety measures are also important to protect the knee from further injury. Any time any of these exercises cause you to have increased pain or swelling in your knee joint, decrease the amount until you are comfortable again and slowly increase them. If you have problems or questions, call your caregiver or physical therapist for advice.  ° °HOME CARE INSTRUCTIONS  °Remove items at home which could result in a fall. This includes throw rugs or furniture in walking pathways.  °· ICE to the affected knee every three hours for 30 minutes at a time and then as needed for pain and swelling.  Continue to use ice on the knee for pain and swelling from surgery. You may notice swelling that will progress down to the foot and ankle.  This is normal after surgery.  Elevate the leg when you are not up walking on it.   °· Continue to use the breathing machine which will help keep your temperature down.  It is common for your temperature to cycle up and down following surgery, especially at night when you are not up moving around and exerting yourself.  The breathing machine keeps your lungs expanded and your temperature down. °· Do not place pillow under knee, focus on keeping the knee straight while resting ° °DIET °You may resume your previous home diet once your are discharged from the hospital. ° °DRESSING / WOUND CARE / SHOWERING °You may shower 3 days after surgery, but keep the wounds dry during showering.  You may use an occlusive plastic wrap (Press'n Seal for example), NO SOAKING/SUBMERGING IN THE BATHTUB.  If the  bandage gets wet, change with a clean dry gauze.  If the incision gets wet, pat the wound dry with a clean towel. °You may start showering once you are discharged home but do not submerge the incision under water. Just pat the incision dry and apply a dry gauze dressing on daily. °Change the surgical dressing daily and reapply a dry dressing each time. ° °ACTIVITY °Walk with your walker as instructed. °Use walker as long as suggested by your caregivers. °Avoid periods of inactivity such as sitting longer than an hour when not asleep. This helps prevent blood clots.  °You may resume a sexual relationship in one month or when given the OK by your doctor.  °You may return to work once you are cleared by your doctor.  °Do not drive a car for 6 weeks or until released by you surgeon.  °Do not drive while taking narcotics. ° °WEIGHT BEARING °Weight bearing as tolerated with assist device (walker, cane, etc) as directed, use it as long as suggested by your surgeon or therapist, typically at least 4-6 weeks. ° °POSTOPERATIVE CONSTIPATION PROTOCOL °Constipation - defined medically as fewer than three stools per week and severe constipation as less than one stool per week. ° °One of the most common issues patients have following surgery is constipation.  Even if you have a regular bowel pattern at home, your normal regimen is likely to be disrupted due to multiple reasons following surgery.  Combination of anesthesia, postoperative narcotics, change in appetite and fluid intake all can affect your bowels.    In order to avoid complications following surgery, here are some recommendations in order to help you during your recovery period. ° °Colace (docusate) - Pick up an over-the-counter form of Colace or another stool softener and take twice a day as long as you are requiring postoperative pain medications.  Take with a full glass of water daily.  If you experience loose stools or diarrhea, hold the colace until you stool forms  back up.  If your symptoms do not get better within 1 week or if they get worse, check with your doctor. ° °Dulcolax (bisacodyl) - Pick up over-the-counter and take as directed by the product packaging as needed to assist with the movement of your bowels.  Take with a full glass of water.  Use this product as needed if not relieved by Colace only.  ° °MiraLax (polyethylene glycol) - Pick up over-the-counter to have on hand.  MiraLax is a solution that will increase the amount of water in your bowels to assist with bowel movements.  Take as directed and can mix with a glass of water, juice, soda, coffee, or tea.  Take if you go more than two days without a movement. °Do not use MiraLax more than once per day. Call your doctor if you are still constipated or irregular after using this medication for 7 days in a row. ° °If you continue to have problems with postoperative constipation, please contact the office for further assistance and recommendations.  If you experience "the worst abdominal pain ever" or develop nausea or vomiting, please contact the office immediatly for further recommendations for treatment. ° °ITCHING ° If you experience itching with your medications, try taking only a single pain pill, or even half a pain pill at a time.  You can also use Benadryl over the counter for itching or also to help with sleep.  ° °TED HOSE STOCKINGS °Wear the elastic stockings on both legs for three weeks following surgery during the day but you may remove then at night for sleeping. ° °MEDICATIONS °See your medication summary on the “After Visit Summary” that the nursing staff will review with you prior to discharge.  You may have some home medications which will be placed on hold until you complete the course of blood thinner medication.  It is important for you to complete the blood thinner medication as prescribed by your surgeon.  Continue your approved medications as instructed at time of  discharge. ° °PRECAUTIONS °If you experience chest pain or shortness of breath - call 911 immediately for transfer to the hospital emergency department.  °If you develop a fever greater that 101 F, purulent drainage from wound, increased redness or drainage from wound, foul odor from the wound/dressing, or calf pain - CONTACT YOUR SURGEON.   °                                                °FOLLOW-UP APPOINTMENTS °Make sure you keep all of your appointments after your operation with your surgeon and caregivers. You should call the office at the above phone number and make an appointment for approximately two weeks after the date of your surgery or on the date instructed by your surgeon outlined in the "After Visit Summary". ° ° °RANGE OF MOTION AND STRENGTHENING EXERCISES  °Rehabilitation of the knee is important following a knee injury or   an operation. After just a few days of immobilization, the muscles of the thigh which control the knee become weakened and shrink (atrophy). Knee exercises are designed to build up the tone and strength of the thigh muscles and to improve knee motion. Often times heat used for twenty to thirty minutes before working out will loosen up your tissues and help with improving the range of motion but do not use heat for the first two weeks following surgery. These exercises can be done on a training (exercise) mat, on the floor, on a table or on a bed. Use what ever works the best and is most comfortable for you Knee exercises include:  °Leg Lifts - While your knee is still immobilized in a splint or cast, you can do straight leg raises. Lift the leg to 60 degrees, hold for 3 sec, and slowly lower the leg. Repeat 10-20 times 2-3 times daily. Perform this exercise against resistance later as your knee gets better.  °Quad and Hamstring Sets - Tighten up the muscle on the front of the thigh (Quad) and hold for 5-10 sec. Repeat this 10-20 times hourly. Hamstring sets are done by pushing the  foot backward against an object and holding for 5-10 sec. Repeat as with quad sets.  °· Leg Slides: Lying on your back, slowly slide your foot toward your buttocks, bending your knee up off the floor (only go as far as is comfortable). Then slowly slide your foot back down until your leg is flat on the floor again. °· Angel Wings: Lying on your back spread your legs to the side as far apart as you can without causing discomfort.  °A rehabilitation program following serious knee injuries can speed recovery and prevent re-injury in the future due to weakened muscles. Contact your doctor or a physical therapist for more information on knee rehabilitation.  ° °IF YOU ARE TRANSFERRED TO A SKILLED REHAB FACILITY °If the patient is transferred to a skilled rehab facility following release from the hospital, a list of the current medications will be sent to the facility for the patient to continue.  When discharged from the skilled rehab facility, please have the facility set up the patient's Home Health Physical Therapy prior to being released. Also, the skilled facility will be responsible for providing the patient with their medications at time of release from the facility to include their pain medication, the muscle relaxants, and their blood thinner medication. If the patient is still at the rehab facility at time of the two week follow up appointment, the skilled rehab facility will also need to assist the patient in arranging follow up appointment in our office and any transportation needs. ° °MAKE SURE YOU:  °Understand these instructions.  °Get help right away if you are not doing well or get worse.  ° ° °Pick up stool softner and laxative for home use following surgery while on pain medications. °Do not submerge incision under water. °Please use good hand washing techniques while changing dressing each day. °May shower starting three days after surgery. °Please use a clean towel to pat the incision dry following  showers. °Continue to use ice for pain and swelling after surgery. °Do not use any lotions or creams on the incision until instructed by your surgeon. ° °Take Xarelto for two and a half more weeks, then discontinue Xarelto. °Once the patient has completed the Xarelto, they may resume the 81 mg Aspirin. ° ° °Information on my medicine - XARELTO® (Rivaroxaban) ° °  This medication education was reviewed with me or my healthcare representative as part of my discharge preparation.   ° °Why was Xarelto® prescribed for you? °Xarelto® was prescribed for you to reduce the risk of blood clots forming after orthopedic surgery. The medical term for these abnormal blood clots is venous thromboembolism (VTE). ° °What do you need to know about xarelto® ? °Take your Xarelto® ONCE DAILY at the same time every day. °You may take it either with or without food. ° °If you have difficulty swallowing the tablet whole, you may crush it and mix in applesauce just prior to taking your dose. ° °Take Xarelto® exactly as prescribed by your doctor and DO NOT stop taking Xarelto® without talking to the doctor who prescribed the medication.  Stopping without other VTE prevention medication to take the place of Xarelto® may increase your risk of developing a clot. ° °After discharge, you should have regular check-up appointments with your healthcare provider that is prescribing your Xarelto®.   ° °What do you do if you miss a dose? °If you miss a dose, take it as soon as you remember on the same day then continue your regularly scheduled once daily regimen the next day. Do not take two doses of Xarelto® on the same day.  ° °Important Safety Information °A possible side effect of Xarelto® is bleeding. You should call your healthcare provider right away if you experience any of the following: °? Bleeding from an injury or your nose that does not stop. °? Unusual colored urine (red or dark brown) or unusual colored stools (red or black). °? Unusual  bruising for unknown reasons. °? A serious fall or if you hit your head (even if there is no bleeding). ° °Some medicines may interact with Xarelto® and might increase your risk of bleeding while on Xarelto®. To help avoid this, consult your healthcare provider or pharmacist prior to using any new prescription or non-prescription medications, including herbals, vitamins, non-steroidal anti-inflammatory drugs (NSAIDs) and supplements. ° °This website has more information on Xarelto®: [email or web address]. ° ° °

## 2016-03-03 NOTE — Discharge Summary (Signed)
Physician Discharge Summary   Patient ID: Marie Harrison MRN: [id number] DOB/AGE: 04/20/1941 75 y.o.  Admit date: 03/02/2016 Discharge date: 03-05-2016  Primary Diagnosis:  Osteoarthritis Left knee(s)  Admission Diagnoses:  Past Medical History  Diagnosis Date  . Depression   . GERD (gastroesophageal reflux disease)   . Hyperlipidemia   . Hypertension   . Arthritis   . Anxiety   . History of CVA (cerebrovascular accident)     Remote CVA per ct 2009    . Allergic rhinitis   . SUI (stress urinary incontinence, female)   . History of basal cell carcinoma excision     x6  face/ head  . Patellar clunk syndrome of right knee   . History of recurrent UTI (urinary tract infection)   . Wears partial dentures     upper and lower  . IBS (irritable bowel syndrome)   . PONV (postoperative nausea and vomiting)   . Fibromyalgia    Discharge Diagnoses:   Active Problems:   OA (osteoarthritis) of knee  Estimated body mass index is 31.19 kg/(m^2) as calculated from the following:   Height as of this encounter: '5\' 1"'$  (1.549 m).   Weight as of this encounter: 74.844 kg (165 lb).  Procedure:  Procedure(s) (LRB): TOTAL LEFT KNEE ARTHROPLASTY (Left)   Consults: None  HPI: Marie Harrison is a 75 y.o. year old female with end stage OA of her left knee with progressively worsening pain and dysfunction. She has constant pain, with activity and at rest and significant functional deficits with difficulties even with ADLs. She has had extensive non-op management including analgesics, injections of cortisone and viscosupplements, and home exercise program, but remains in significant pain with significant dysfunction. Radiographs show bone on bone arthritis medial and patellofemoral. She presents now for left Total Knee Arthroplasty.   Laboratory Data: Admission on 03/02/2016  Component Date Value Ref Range Status  . Color, Urine 02/24/2016 YELLOW  YELLOW Final  . APPearance 02/24/2016 CLEAR   CLEAR Final  . Specific Gravity, Urine 02/24/2016 1.011  1.005 - 1.030 Final  . pH 02/24/2016 6.0  5.0 - 8.0 Final  . Glucose, UA 02/24/2016 NEGATIVE  NEGATIVE mg/dL Final  . Hgb urine dipstick 02/24/2016 NEGATIVE  NEGATIVE Final  . Bilirubin Urine 02/24/2016 NEGATIVE  NEGATIVE Final  . Ketones, ur 02/24/2016 NEGATIVE  NEGATIVE mg/dL Final  . Protein, ur 02/24/2016 NEGATIVE  NEGATIVE mg/dL Final  . Nitrite 02/24/2016 NEGATIVE  NEGATIVE Final  . Leukocytes, UA 02/24/2016 NEGATIVE  NEGATIVE Final   MICROSCOPIC NOT DONE ON URINES WITH NEGATIVE PROTEIN, BLOOD, LEUKOCYTES, NITRITE, OR GLUCOSE <1000 mg/dL.  . WBC 03/03/2016 17.4* 4.0 - 10.5 K/uL Final  . RBC 03/03/2016 3.28* 3.87 - 5.11 MIL/uL Final  . Hemoglobin 03/03/2016 10.5* 12.0 - 15.0 g/dL Final  . HCT 03/03/2016 31.1* 36.0 - 46.0 % Final  . MCV 03/03/2016 94.8  78.0 - 100.0 fL Final  . MCH 03/03/2016 32.0  26.0 - 34.0 pg Final  . MCHC 03/03/2016 33.8  30.0 - 36.0 g/dL Final  . RDW 03/03/2016 15.8* 11.5 - 15.5 % Final  . Platelets 03/03/2016 283  150 - 400 K/uL Final  . Sodium 03/03/2016 129* 135 - 145 mmol/L Final  . Potassium 03/03/2016 4.0  3.5 - 5.1 mmol/L Final  . Chloride 03/03/2016 95* 101 - 111 mmol/L Final  . CO2 03/03/2016 28  22 - 32 mmol/L Final  . Glucose, Bld 03/03/2016 127* 65 - 99 mg/dL Final  . BUN 03/03/2016 13  6 - 20 mg/dL Final  . Creatinine, Ser 03/03/2016 0.44  0.44 - 1.00 mg/dL Final  . Calcium 88/09/3270 8.2* 8.9 - 10.3 mg/dL Final  . GFR calc non Af Amer 03/03/2016 >60  >60 mL/min Final  . GFR calc Af Amer 03/03/2016 >60  >60 mL/min Final   Comment: (NOTE) The eGFR has been calculated using the CKD EPI equation. This calculation has not been validated in all clinical situations. eGFR's persistently <60 mL/min signify possible Chronic Kidney Disease.   Eustaquio Boyden gap 03/03/2016 6  5 - 15 Final  Hospital Outpatient Visit on 02/24/2016  Component Date Value Ref Range Status  . ABO/RH(D) 02/24/2016 A POS    Final  . Antibody Screen 02/24/2016 NEG   Final  . Sample Expiration 02/24/2016 03/05/2016   Final  . Extend sample reason 02/24/2016 NO TRANSFUSIONS OR PREGNANCY IN THE PAST 3 MONTHS   Final  . aPTT 02/24/2016 30  24 - 37 seconds Final  . WBC 02/24/2016 8.7  4.0 - 10.5 K/uL Final  . RBC 02/24/2016 3.84* 3.87 - 5.11 MIL/uL Final  . Hemoglobin 02/24/2016 12.1  12.0 - 15.0 g/dL Final  . HCT 01/13/4616 36.0  36.0 - 46.0 % Final  . MCV 02/24/2016 93.8  78.0 - 100.0 fL Final  . MCH 02/24/2016 31.5  26.0 - 34.0 pg Final  . MCHC 02/24/2016 33.6  30.0 - 36.0 g/dL Final  . RDW 82/05/3885 15.6* 11.5 - 15.5 % Final  . Platelets 02/24/2016 313  150 - 400 K/uL Final  . Neutrophils Relative % 02/24/2016 49   Final  . Neutro Abs 02/24/2016 4.3  1.7 - 7.7 K/uL Final  . Lymphocytes Relative 02/24/2016 35   Final  . Lymphs Abs 02/24/2016 3.0  0.7 - 4.0 K/uL Final  . Monocytes Relative 02/24/2016 9   Final  . Monocytes Absolute 02/24/2016 0.8  0.1 - 1.0 K/uL Final  . Eosinophils Relative 02/24/2016 6   Final  . Eosinophils Absolute 02/24/2016 0.5  0.0 - 0.7 K/uL Final  . Basophils Relative 02/24/2016 1   Final  . Basophils Absolute 02/24/2016 0.1  0.0 - 0.1 K/uL Final  . Sodium 02/24/2016 132* 135 - 145 mmol/L Final  . Potassium 02/24/2016 3.8  3.5 - 5.1 mmol/L Final  . Chloride 02/24/2016 97* 101 - 111 mmol/L Final  . CO2 02/24/2016 28  22 - 32 mmol/L Final  . Glucose, Bld 02/24/2016 97  65 - 99 mg/dL Final  . BUN 38/15/1655 13  6 - 20 mg/dL Final  . Creatinine, Ser 02/24/2016 0.55  0.44 - 1.00 mg/dL Final  . Calcium 71/32/2738 9.0  8.9 - 10.3 mg/dL Final  . Total Protein 02/24/2016 7.0  6.5 - 8.1 g/dL Final  . Albumin 92/60/1815 4.6  3.5 - 5.0 g/dL Final  . AST 19/12/1429 23  15 - 41 U/L Final  . ALT 02/24/2016 20  14 - 54 U/L Final  . Alkaline Phosphatase 02/24/2016 64  38 - 126 U/L Final  . Total Bilirubin 02/24/2016 0.2* 0.3 - 1.2 mg/dL Final  . GFR calc non Af Amer 02/24/2016 >60  >60 mL/min  Final  . GFR calc Af Amer 02/24/2016 >60  >60 mL/min Final   Comment: (NOTE) The eGFR has been calculated using the CKD EPI equation. This calculation has not been validated in all clinical situations. eGFR's persistently <60 mL/min signify possible Chronic Kidney Disease.   . Anion gap 02/24/2016 7  5 - 15 Final  . Prothrombin  Time 02/24/2016 13.4  11.6 - 15.2 seconds Final  . INR 02/24/2016 1.04  0.00 - 1.49 Final  . MRSA, PCR 02/24/2016 NEGATIVE  NEGATIVE Final  . Staphylococcus aureus 02/24/2016 NEGATIVE  NEGATIVE Final   Comment:        The Xpert SA Assay (FDA approved for NASAL specimens in patients over 74 years of age), is one component of a comprehensive surveillance program.  Test performance has been validated by [facility] for patients greater than or equal to 43 year old. It is not intended to diagnose infection nor to guide or monitor treatment.      X-Rays:Dg Chest 2 View  02/24/2016  CLINICAL DATA:  Pre op total knee - previous smoker- hx hypertension - pt states no other chest hx EXAM: CHEST  2 VIEW COMPARISON:  04/23/2014 FINDINGS: Cardiac silhouette is normal in size and configuration. The aorta is mildly uncoiled. No mediastinal or hilar masses or evidence of adenopathy. Mild anterior lung base scarring or chronic subsegmental atelectasis. Minor apical scarring. Lungs otherwise clear. No pleural effusion or pneumothorax. Bony thorax demineralized intact. IMPRESSION: No active cardiopulmonary disease. Electronically Signed   By: Lajean Manes M.D.   On: 02/24/2016 15:30    EKG: Orders placed or performed during the hospital encounter of 10/02/15  . EKG     Hospital Course: Pearlene Teat is a 75 y.o. who was admitted to [facility]. They were brought to the operating room on 03/02/2016 and underwent Procedure(s): TOTAL LEFT KNEE ARTHROPLASTY.  Patient tolerated the procedure well and was later transferred to the recovery room and then to the  orthopaedic floor for postoperative care.  They were given PO and IV analgesics for pain control following their surgery.  They were given 24 hours of postoperative antibiotics of  Anti-infectives    Start     Dose/Rate Route Frequency Ordered Stop   03/02/16 1600  ceFAZolin (ANCEF) IVPB 2g/100 mL premix     2 g 200 mL/hr over 30 Minutes Intravenous Every 6 hours 03/02/16 1250 03/02/16 2147   03/02/16 0739  ceFAZolin (ANCEF) IVPB 2g/100 mL premix     2 g 200 mL/hr over 30 Minutes Intravenous On call to O.R. 03/02/16 4696 03/02/16 1021     and started on DVT prophylaxis in the form of Xarelto.   PT and OT were ordered for total joint protocol.  Discharge planning consulted to help with postop disposition and equipment needs.  Patient had a tough night on the evening of surgery, not much sleep.  They started to get up OOB with therapy on day one. Hemovac drain was pulled without difficulty.  Became sedated on the meds and weened down to Tramadol. Continued to work with therapy into day two.  Discontinued the Dilaudid.  Dressing was changed on day two and the incision was healing well. She dropped her O2 sats when up with therapy.  CXR was checked and was normal.  Placed back onto [place] O2 with good response.  By day three, she was doing a little better.  She was walking over 30 feet , only limited by fatigue.  Her stats stayed up with therapy so she was setup to transfer to the SNF.  Diet - Cardiac diet Follow up - in 2 weeks Activity - WBAT Disposition - Skilled nursing facility Condition Upon Discharge - slowly improving D/C Meds - See DC Summary DVT Prophylaxis - Xarelto  Discharge Instructions    Call MD / Call 911  Complete by:  As directed   If you experience chest pain or shortness of breath, CALL 911 and be transported to the hospital emergency room.  If you develope a fever above 101 F, pus (white drainage) or increased drainage or redness at the wound, or calf pain, call your surgeon's  office.     Change dressing    Complete by:  As directed   Change dressing daily with sterile 4 x 4 inch gauze dressing and apply TED hose. Do not submerge the incision under water.     Constipation Prevention    Complete by:  As directed   Drink plenty of fluids.  Prune juice may be helpful.  You may use a stool softener, such as Colace (over the counter) 100 mg twice a day.  Use MiraLax (over the counter) for constipation as needed.     Diet - low sodium heart healthy    Complete by:  As directed      Discharge instructions    Complete by:  As directed   Pick up stool softner and laxative for home use following surgery while on pain medications. Do not submerge incision under water. Please use good hand washing techniques while changing dressing each day. May shower starting three days after surgery. Please use a clean towel to pat the incision dry following showers. Continue to use ice for pain and swelling after surgery. Do not use any lotions or creams on the incision until instructed by your surgeon.  Take Xarelto for two and a half more weeks, then discontinue Xarelto. Once the patient has completed the Xarelto, they may resume the 81 mg Aspirin.  Postoperative Constipation Protocol  Constipation - defined medically as fewer than three stools per week and severe constipation as less than one stool per week.  One of the most common issues patients have following surgery is constipation.  Even if you have a regular bowel pattern at home, your normal regimen is likely to be disrupted due to multiple reasons following surgery.  Combination of anesthesia, postoperative narcotics, change in appetite and fluid intake all can affect your bowels.  In order to avoid complications following surgery, here are some recommendations in order to help you during your recovery period.  Colace (docusate) - Pick up an over-the-counter form of Colace or another stool softener and take twice a day as  long as you are requiring postoperative pain medications.  Take with a full glass of water daily.  If you experience loose stools or diarrhea, hold the colace until you stool forms back up.  If your symptoms do not get better within 1 week or if they get worse, check with your doctor.  Dulcolax (bisacodyl) - Pick up over-the-counter and take as directed by the product packaging as needed to assist with the movement of your bowels.  Take with a full glass of water.  Use this product as needed if not relieved by Colace only.   MiraLax (polyethylene glycol) - Pick up over-the-counter to have on hand.  MiraLax is a solution that will increase the amount of water in your bowels to assist with bowel movements.  Take as directed and can mix with a glass of water, juice, soda, coffee, or tea.  Take if you go more than two days without a movement. Do not use MiraLax more than once per day. Call your doctor if you are still constipated or irregular after using this medication for 7 days in a row.  If  you continue to have problems with postoperative constipation, please contact the office for further assistance and recommendations.  If you experience "the worst abdominal pain ever" or develop nausea or vomiting, please contact the office immediatly for further recommendations for treatment.  When discharged from the skilled rehab facility, please have the facility set up the patient's [place] prior to being released.  Please make sure this gets set up prior to release in order to avoid any lapse of therapy following the rehab stay.  Also provide the patient with their medications at time of release from the facility to include their pain medication, the muscle relaxants, and their blood thinner medication.  If the patient is still at the rehab facility at time of follow up appointment, please also assist the patient in arranging follow up appointment in our office and any transportation  needs. ICE to the affected knee or hip every three hours for 30 minutes at a time and then as needed for pain and swelling.      Do not put a pillow under the knee. Place it under the heel.    Complete by:  As directed      Do not sit on low chairs, stoools or toilet seats, as it may be difficult to get up from low surfaces    Complete by:  As directed      Driving restrictions    Complete by:  As directed   No driving until released by the physician.     Increase activity slowly as tolerated    Complete by:  As directed      Lifting restrictions    Complete by:  As directed   No lifting until released by the physician.     Patient may shower    Complete by:  As directed   You may shower without a dressing once there is no drainage.  Do not wash over the wound.  If drainage remains, do not shower until drainage stops.     TED hose    Complete by:  As directed   Use stockings (TED hose) for 3 weeks on both leg(s).  You may remove them at night for sleeping.     Weight bearing as tolerated    Complete by:  As directed   Laterality:  left  Extremity:  Lower            Medication List    STOP taking these medications        aspirin EC 81 MG tablet     D 10000 10000 units Caps  Generic drug:  Cholecalciferol     meloxicam 7.5 MG tablet  Commonly known as:  MOBIC      TAKE these medications        acetaminophen 325 MG tablet  Commonly known as:  TYLENOL  Take 325-650 mg by mouth every 6 (six) hours as needed (Pain).     diphenhydrAMINE 12.5 MG/5ML elixir  Commonly known as:  BENADRYL  Take 5-10 mLs (12.5-25 mg total) by mouth every 4 (four) hours as needed for itching.     docusate sodium 100 MG capsule  Commonly known as:  COLACE  Take 1 capsule (100 mg total) by mouth 2 (two) times daily.     FLUoxetine 20 MG capsule  Commonly known as:  PROZAC  Take 1 capsule (20 mg total) by mouth every morning.     hydrochlorothiazide 25 MG tablet  Commonly known as:  HYDRODIURIL  Take 1 tablet (25 mg total) by mouth every morning.     HYDROmorphone 2 MG tablet  Commonly known as:  DILAUDID  Take 1-2 tablets (2-4 mg total) by mouth every 4 (four) hours as needed for moderate pain or severe pain.     ipratropium 0.03 % nasal spray  Commonly known as:  ATROVENT  Place 2 sprays into both nostrils 2 (two) times daily.     lisinopril 40 MG tablet  Commonly known as:  PRINIVIL,ZESTRIL  Take 80 mg by mouth every morning.     loratadine 10 MG tablet  Commonly known as:  CLARITIN  Take 10 mg by mouth daily as needed for allergies.     LORazepam 0.5 MG tablet  Commonly known as:  ATIVAN  TAKE 0.5 TABLET BY MOUTH AT BEDTIME AS NEEDED FOR SLEEP     lovastatin 40 MG tablet  Commonly known as:  MEVACOR  Take 1 tablet (40 mg total) by mouth daily with supper.     methocarbamol 500 MG tablet  Commonly known as:  ROBAXIN  Take 1 tablet (500 mg total) by mouth every 6 (six) hours as needed for muscle spasms.     metoCLOPramide 5 MG tablet  Commonly known as:  REGLAN  Take 1 tablet (5 mg total) by mouth every 8 (eight) hours as needed for nausea (if ondansetron (ZOFRAN) ineffective.).     omeprazole 40 MG capsule  Commonly known as:  PRILOSEC  Take 40 mg by mouth daily.     ondansetron 4 MG tablet  Commonly known as:  ZOFRAN  Take 1 tablet (4 mg total) by mouth every 6 (six) hours as needed for nausea.     polyethylene glycol packet  Commonly known as:  MIRALAX / GLYCOLAX  Take 17 g by mouth daily as needed for mild constipation.     potassium chloride 10 MEQ tablet  Commonly known as:  K-DUR,KLOR-CON  Take 1 tablet (10 mEq total) by mouth 2 (two) times daily.     rivaroxaban 10 MG Tabs tablet  Commonly known as:  XARELTO  Take 1 tablet (10 mg total) by mouth daily with breakfast. Take Xarelto for two and a half more weeks, then discontinue Xarelto. Once the patient has completed the Xarelto, they may resume the 81 mg Aspirin.     sodium  phosphate 7-19 GM/118ML Enem  Place 133 mLs (1 enema total) rectally once as needed for severe constipation.     traMADol 50 MG tablet  Commonly known as:  ULTRAM  Take 1-2 tablets (50-100 mg total) by mouth every 6 (six) hours as needed (mild pain).           Follow-up Information    Follow up with Gearlean Alf, MD On 03/17/2016.   Specialty:  Orthopedic Surgery   Why:  Call office ASAP at [phone] to setup appointment on Tuesday 03/17/2016 with Dr. Wynelle Link.   Contact information:   [address] [place] [address] [phone]       Signed: Arlee Muslim, PA-C Orthopaedic Surgery 03/03/2016, 9:37 PM

## 2016-03-03 NOTE — Progress Notes (Signed)
CM notes pt to go to [facility] with outpt PT; CSW aware and arranging.  No other CM needs were communicated.

## 2016-03-04 ENCOUNTER — Inpatient Hospital Stay (HOSPITAL_COMMUNITY): Payer: Medicare Other

## 2016-03-04 LAB — BASIC METABOLIC PANEL
ANION GAP: 6 (ref 5–15)
BUN: 11 mg/dL (ref 6–20)
CHLORIDE: 94 mmol/L — AB (ref 101–111)
CO2: 29 mmol/L (ref 22–32)
Calcium: 8.4 mg/dL — ABNORMAL LOW (ref 8.9–10.3)
Creatinine, Ser: 0.46 mg/dL (ref 0.44–1.00)
GFR calc Af Amer: >60 mL/min (ref >60)
GLUCOSE: 112 mg/dL — AB (ref 65–99)
POTASSIUM: 4.2 mmol/L (ref 3.5–5.1)
Sodium: 129 mmol/L — ABNORMAL LOW (ref 135–145)

## 2016-03-04 LAB — CBC
HCT: 28.4 % — ABNORMAL LOW (ref 36.0–46.0)
HEMOGLOBIN: 9.8 g/dL — AB (ref 12.0–15.0)
MCH: 31.8 pg (ref 26.0–34.0)
MCHC: 34.5 g/dL (ref 30.0–36.0)
MCV: 92.2 fL (ref 78.0–100.0)
PLATELETS: 255 10*3/uL (ref 150–400)
RBC: 3.08 MIL/uL — AB (ref 3.87–5.11)
RDW: 15.7 % — ABNORMAL HIGH (ref 11.5–15.5)
WBC: 16.1 10*3/uL — AB (ref 4.0–10.5)

## 2016-03-04 NOTE — Progress Notes (Addendum)
PT notified this nurse that pt became hypoxic while trying to ambulate pt. Pt noted to have shortness of breath at this time as well. Notified [name], [place] about pt symptoms while in PT. New orders received.

## 2016-03-04 NOTE — Progress Notes (Signed)
Physical Therapy Treatment Patient Details Name: Marie Harrison MRN: [id number] DOB: 03-20-1941 Today's Date: 03/04/2016    History of Present Illness s/p L TKA; PMHx: R TKA    PT Comments    POD # 2 am session Pt OOB in recliner slightly impaired cognition with difficulty remembering short term events.  KI no longer in room (missing).  Assisted out of recliner to amb to bathroom then a short distance in hallway.  Noted 3/4 DOE and max c/o fatigue.  Pt progressing slowly and will need ST Rehab at SNF.   Follow Up Recommendations  SNF [facility])     Equipment Recommendations  None recommended by PT    Recommendations for Other Services       Precautions / Restrictions Precautions Precautions: Knee Restrictions Weight Bearing Restrictions: No Other Position/Activity Restrictions: WBAT    Mobility  Bed Mobility               General bed mobility comments: Pt OOB in recliner  Transfers Overall transfer level: Needs assistance Equipment used: Rolling walker (2 wheeled) Transfers: Sit to/from Stand Sit to Stand: Mod assist;From elevated surface         General transfer comment: cues for hand placement and safety with turn completion  Ambulation/Gait Ambulation/Gait assistance: Min assist;Mod assist Ambulation Distance (Feet): 14 Feet Assistive device: Rolling walker (2 wheeled) Gait Pattern/deviations: Step-to pattern;Decreased stance time - left Gait velocity: decreased   General Gait Details: unsteady gait with impaired safety cognition.  Amb on RA sats decreased from 90 to 85% with noted dyspnea and c/o SOB.  Limited actvity tolerance.     Stairs            Wheelchair Mobility    Modified Rankin (Stroke Patients Only)       Balance                                    Cognition Arousal/Alertness: Awake/alert Behavior During Therapy: WFL for tasks assessed/performed Overall Cognitive Status: Impaired/Different from  baseline Area of Impairment: Memory;Safety/judgement                    Exercises      General Comments        Pertinent Vitals/Pain Pain Assessment: 0-10 Pain Score: 3  Pain Location: L knee Pain Descriptors / Indicators: Aching;Sore Pain Intervention(s): Monitored during session;Repositioned;Ice applied    Home Living                      Prior Function            PT Goals (current goals can now be found in the care plan section) Progress towards PT goals: Progressing toward goals    Frequency       PT Plan Current plan remains appropriate    Co-evaluation             End of Session Equipment Utilized During Treatment: Gait belt Activity Tolerance: Patient limited by fatigue Patient left: in chair;with call bell/phone within reach;with family/visitor present;with chair alarm set     Time: [id number] PT Time Calculation (min) (ACUTE ONLY): 29 min  Charges:  $Gait Training: 8-22 mins $Therapeutic Activity: 8-22 mins                    G Codes:      Rica Koyanagi  PTA [facility]  Acute  Rehab Pager      [phone]

## 2016-03-04 NOTE — Progress Notes (Signed)
PHYSICAL THERAPY 15:00  PM session pt still c/o SOB with amb.  RA at rest is 90% and decreases to 84% with excursion with noted 3/4 DOE in which activity needs to cease.  With sitting rest break, RA increases to 96%.  HR 88.  Reported to RN.  amb distance limited to 15 feet.     Rica Koyanagi  PTA [facility]  Acute  Rehab Pager      [phone]

## 2016-03-04 NOTE — Progress Notes (Signed)
Physical Therapy Treatment Patient Details Name: Marie Harrison MRN: [id number] DOB: Feb 20, 1941 Today's Date: 03/04/2016    History of Present Illness s/p L TKA; PMHx: R TKA    PT Comments    POD # 2 pm session Pt in recliner on 2 lts sats 94%.  Removed O2 for trial with amb.  RA decreased from 90 to 84% with noted 3/4 DOE and c/o SOB/fatigue in which chair was brought to pt after only amb 14 feet.  Reported to RN.  After 5 min sitting rest break, RA increased to 96% with HR 88.  Max c/o fatigue by pt.  Returned to room then assisted back to bed.   Follow Up Recommendations  SNF [facility])     Equipment Recommendations  None recommended by PT    Recommendations for Other Services       Precautions / Restrictions Precautions Precautions: Knee Restrictions Weight Bearing Restrictions: No Other Position/Activity Restrictions: WBAT    Mobility  Bed Mobility               General bed mobility comments: Pt OOB in recliner  Transfers Overall transfer level: Needs assistance Equipment used: Rolling walker (2 wheeled) Transfers: Sit to/from Stand Sit to Stand: Mod assist;From elevated surface         General transfer comment: cues for hand placement and safety with turn completion  Ambulation/Gait Ambulation/Gait assistance: Min assist;Mod assist Ambulation Distance (Feet): 14 Feet Assistive device: Rolling walker (2 wheeled) Gait Pattern/deviations: Step-to pattern;Decreased stance time - left Gait velocity: decreased   General Gait Details: unsteady gait with impaired safety cognition.  Amb on RA sats decreased from 90 to 84% with noted dyspnea and c/o SOB.  Limited actvity tolerance.  Reported to Photographer Rankin (Stroke Patients Only)       Balance                                    Cognition Arousal/Alertness: Awake/alert Behavior During Therapy: WFL for tasks  assessed/performed Overall Cognitive Status: Impaired/Different from baseline Area of Impairment: Memory;Safety/judgement                    Exercises      General Comments        Pertinent Vitals/Pain Pain Assessment: 0-10 Pain Score: 3  Pain Location: L knee Pain Descriptors / Indicators: Aching;Sore Pain Intervention(s): Monitored during session;Repositioned;Ice applied    Home Living                      Prior Function            PT Goals (current goals can now be found in the care plan section) Progress towards PT goals: Progressing toward goals    Frequency       PT Plan Current plan remains appropriate    Co-evaluation             End of Session Equipment Utilized During Treatment: Gait belt Activity Tolerance: Patient limited by fatigue Patient left: in chair;with call bell/phone within reach;with family/visitor present;with chair alarm set     Time: 1450-1520 PT Time Calculation (min) (ACUTE ONLY): 30 min  Charges:  $Gait Training: 8-22 mins $Therapeutic Activity: 8-22 mins  G Codes:      Rica Koyanagi  PTA [facility]  Acute  Rehab Pager      [phone]

## 2016-03-04 NOTE — Progress Notes (Signed)
   Subjective: 2 Days Post-Op Procedure(s) (LRB): TOTAL LEFT KNEE ARTHROPLASTY (Left) Patient reports pain as mild.   Patient seen in rounds by Dr. Wynelle Link.  Sitting up in chair. Patient is well, but has had some minor complaints of pain in the knee, requiring pain medications.  Encourage to use the Tramadol. Plan is to go Skilled nursing facility after hospital stay.  Lives at [facility].  Objective: Vital signs in last 24 hours: Temp:  [97.6 F (36.4 C)-98.7 F (37.1 C)] 97.6 F (36.4 C) (06/28 0503) Pulse Rate:  [72-93] 78 (06/28 0503) Resp:  [16] 16 (06/28 0503) BP: (112-154)/(48-81) 135/58 mmHg (06/28 0503) SpO2:  [92 %-95 %] 94 % (06/28 0503)  Intake/Output from previous day:  Intake/Output Summary (Last 24 hours) at 03/04/16 0703 Last data filed at 03/04/16 0603  Gross per 24 hour  Intake 1500.83 ml  Output   1525 ml  Net -24.17 ml    Labs:  Recent Labs  03/03/16 0426 03/04/16 0408  HGB 10.5* 9.8*    Recent Labs  03/03/16 0426 03/04/16 0408  WBC 17.4* 16.1*  RBC 3.28* 3.08*  HCT 31.1* 28.4*  PLT 283 255    Recent Labs  03/03/16 0426 03/04/16 0408  NA 129* 129*  K 4.0 4.2  CL 95* 94*  CO2 28 29  BUN 13 11  CREATININE 0.44 0.46  GLUCOSE 127* 112*  CALCIUM 8.2* 8.4*   No results for input(s): LABPT, INR in the last 72 hours.  EXAM General - Patient is Alert, Oriented and but sedated Extremity - Neurovascular intact Sensation intact distally Dorsiflexion/Plantar flexion intact Dressing/Incision - clean, dry Motor Function - intact, moving foot and toes well on exam.   Past Medical History  Diagnosis Date  . Depression   . GERD (gastroesophageal reflux disease)   . Hyperlipidemia   . Hypertension   . Arthritis   . Anxiety   . History of CVA (cerebrovascular accident)     Remote CVA per ct 2009    . Allergic rhinitis   . SUI (stress urinary incontinence, female)   . History of basal cell carcinoma excision     x6  face/ head  .  Patellar clunk syndrome of right knee   . History of recurrent UTI (urinary tract infection)   . Wears partial dentures     upper and lower  . IBS (irritable bowel syndrome)   . PONV (postoperative nausea and vomiting)   . Fibromyalgia     Assessment/Plan: 2 Days Post-Op Procedure(s) (LRB): TOTAL LEFT KNEE ARTHROPLASTY (Left) Active Problems:   OA (osteoarthritis) of knee  Estimated body mass index is 31.19 kg/(m^2) as calculated from the following:   Height as of this encounter: 5\' 1"  (1.549 m).   Weight as of this encounter: 74.844 kg (165 lb). Up with therapy  Encourage Tramadol and hold on Dilaudid for now  DVT Prophylaxis - Xarelto Weight-Bearing as tolerated to left leg Encourage mobility and activity Plan for the skilled unit at [facility] landing tomorrow.  Arlee Muslim, PA-C Orthopaedic Surgery 03/04/2016, 7:03 AM

## 2016-03-05 LAB — CBC
HEMATOCRIT: 27.3 % — AB (ref 36.0–46.0)
HEMOGLOBIN: 9.6 g/dL — AB (ref 12.0–15.0)
MCH: 32.8 pg (ref 26.0–34.0)
MCHC: 35.2 g/dL (ref 30.0–36.0)
MCV: 93.2 fL (ref 78.0–100.0)
Platelets: 236 10*3/uL (ref 150–400)
RBC: 2.93 MIL/uL — AB (ref 3.87–5.11)
RDW: 15.8 % — ABNORMAL HIGH (ref 11.5–15.5)
WBC: 13.4 10*3/uL — AB (ref 4.0–10.5)

## 2016-03-05 LAB — BASIC METABOLIC PANEL
ANION GAP: 5 (ref 5–15)
BUN: 9 mg/dL (ref 6–20)
CALCIUM: 8.3 mg/dL — AB (ref 8.9–10.3)
CHLORIDE: 96 mmol/L — AB (ref 101–111)
CO2: 29 mmol/L (ref 22–32)
Creatinine, Ser: 0.38 mg/dL — ABNORMAL LOW (ref 0.44–1.00)
GFR calc non Af Amer: >60 mL/min (ref >60)
Glucose, Bld: 119 mg/dL — ABNORMAL HIGH (ref 65–99)
Potassium: 4.2 mmol/L (ref 3.5–5.1)
SODIUM: 130 mmol/L — AB (ref 135–145)

## 2016-03-05 NOTE — Progress Notes (Signed)
Physical Therapy Treatment Patient Details Name: Marie Harrison MRN: [id number] DOB: [birth year] Today's Date: 03/05/2016    History of Present Illness s/p L TKA; PMHx: R TKA    PT Comments    POD # 3 Pt OOB in recliner on RA sats 92% with resting HR 100.  Feeling "better" but still "foggy".  Demonstrates increased cognition/awareness.  Assisted out of recliner to amb a greater distance.  Slow but steady.  Avg RA 94% with HR slightly tachy.  98 - 121.  Limited amb distance due to fatigue but appears close to base. Pt progressing better with pain med adjustment.  Ready for D/C to next level.  Follow Up Recommendations  SNF [facility])     Equipment Recommendations  None recommended by PT    Recommendations for Other Services       Precautions / Restrictions Precautions Precautions: Knee Precaution Comments: instructed on KI use for amb  Required Braces or Orthoses: Knee Immobilizer - Left Knee Immobilizer - Left: Discontinue once straight leg raise with < 10 degree lag Restrictions Weight Bearing Restrictions: No Other Position/Activity Restrictions: WBAT    Mobility  Bed Mobility               General bed mobility comments: Pt OOB in recliner  Transfers Overall transfer level: Needs assistance Equipment used: Rolling walker (2 wheeled) Transfers: Sit to/from Stand Sit to Stand: Min guard         General transfer comment: 25% VC's for proper hand placement with increased ability to self rise  Ambulation/Gait Ambulation/Gait assistance: Min guard;Min assist Ambulation Distance (Feet): 32 Feet Assistive device: Rolling walker (2 wheeled) Gait Pattern/deviations: Step-to pattern;Decreased stance time - left     General Gait Details: slow but staedy gait.  Avg RA 94% with HR range from 121 to 98.  Not as much c/o SOB.  Limited activity distance due to mild fatigue.  dry cough   Stairs            Wheelchair Mobility    Modified Rankin (Stroke  Patients Only)       Balance                                    Cognition Arousal/Alertness: Awake/alert (improved cognition)   Overall Cognitive Status: Within Functional Limits for tasks assessed                 General Comments: improved cognition     Exercises   Total Knee Replacement TE's 10 reps B LE ankle pumps 10 reps towel squeezes 10 reps knee presses 10 reps heel slides AAROM 10 reps SLR's AAROM 10 reps ABD Followed by ICE     General Comments        Pertinent Vitals/Pain Pain Assessment: 0-10 Pain Score: 3  Pain Location: L knee Pain Descriptors / Indicators: Aching;Sore Pain Intervention(s): Monitored during session;Premedicated before session;Repositioned;Ice applied    Home Living                      Prior Function            PT Goals (current goals can now be found in the care plan section) Progress towards PT goals: Progressing toward goals    Frequency  7X/week    PT Plan Current plan remains appropriate    Co-evaluation  End of Session Equipment Utilized During Treatment: Gait belt Activity Tolerance: Patient tolerated treatment well Patient left: in chair;with call bell/phone within reach;with family/visitor present;with chair alarm set     Time: [id number] PT Time Calculation (min) (ACUTE ONLY): 32 min  Charges:  $Gait Training: 8-22 mins $Therapeutic Exercise: 8-22 mins                    G Codes:      Rica Koyanagi  PTA [facility]  Acute  Rehab Pager      [phone]

## 2016-03-05 NOTE — Progress Notes (Signed)
Pt / spouse are in agreement with d/c to [facility] today. [facility] transport is required. Pt / spouse are aware out of pocket costs may be associated with [facility] transport. D/C Summary sent to SNF for review. # for report provided to [place].  Werner Lean LCSW [phone]

## 2016-03-05 NOTE — Progress Notes (Signed)
Attempted to call report to [facility] transferred call no answer  D Mateo Flow RN

## 2016-03-05 NOTE — Care Management Important Message (Signed)
Important Message  Patient Details  Name: Marie Harrison MRN: [id number] Date of Birth: Aug 22, 1941   Medicare Important Message Given:  Yes    [name] 03/05/2016, 9:05 AMImportant Message  Patient Details  Name: Marie Harrison MRN: [id number] Date of Birth: Jan 31, 1941   Medicare Important Message Given:  Yes    [name] 03/05/2016, 9:05 AM

## 2016-03-05 NOTE — Progress Notes (Signed)
Subjective: 3 Days Post-Op Procedure(s) (LRB): TOTAL LEFT KNEE ARTHROPLASTY (Left) Patient reports pain as mild.   Patient seen in rounds by Dr. Wynelle Link. Events of yesterday afternoon reviewed.  Drop in sats with therapy.  CXR was normal.  Back on O2 and stas are 97% on 2 L.  She was arounf 90% on room air yesterday bur dropped with therapy.  Will attempt to ween off O2 again.  If she drops below 90% again, will hold on transfer and get CT scan to R/O other pathology. Patient is well, but has had some minor complaints of pain in the knee, requiring pain medications Will see how she does today before making any decision on transfer.  Possible transfer if improved.  Objective: Vital signs in last 24 hours: Temp:  [98 F (36.7 C)-98.8 F (37.1 C)] 98.2 F (36.8 C) (06/29 0505) Pulse Rate:  [71-93] 93 (06/29 0505) Resp:  [18] 18 (06/29 0505) BP: (142-153)/(64-72) 144/72 mmHg (06/29 0505) SpO2:  [96 %-97 %] 97 % (06/29 0505)  Intake/Output from previous day:  Intake/Output Summary (Last 24 hours) at 03/05/16 0749 Last data filed at 03/05/16 0505  Gross per 24 hour  Intake    600 ml  Output      0 ml  Net    600 ml    Labs:  Recent Labs  03/03/16 0426 03/04/16 0408 03/05/16 0429  HGB 10.5* 9.8* 9.6*    Recent Labs  03/04/16 0408 03/05/16 0429  WBC 16.1* 13.4*  RBC 3.08* 2.93*  HCT 28.4* 27.3*  PLT 255 236    Recent Labs  03/04/16 0408 03/05/16 0429  NA 129* 130*  K 4.2 4.2  CL 94* 96*  CO2 29 29  BUN 11 9  CREATININE 0.46 0.38*  GLUCOSE 112* 119*  CALCIUM 8.4* 8.3*   No results for input(s): LABPT, INR in the last 72 hours.  EXAM: General - Patient is Alert and Appropriate Extremity - Neurovascular intact Sensation intact distally Dorsiflexion/Plantar flexion intact Incision - clean, dry, no drainage Motor Function - intact, moving foot and toes well on exam.   Assessment/Plan: 3 Days Post-Op Procedure(s) (LRB): TOTAL LEFT KNEE ARTHROPLASTY  (Left) Procedure(s) (LRB): TOTAL LEFT KNEE ARTHROPLASTY (Left) Past Medical History  Diagnosis Date  . Depression   . GERD (gastroesophageal reflux disease)   . Hyperlipidemia   . Hypertension   . Arthritis   . Anxiety   . History of CVA (cerebrovascular accident)     Remote CVA per ct 2009    . Allergic rhinitis   . SUI (stress urinary incontinence, female)   . History of basal cell carcinoma excision     x6  face/ head  . Patellar clunk syndrome of right knee   . History of recurrent UTI (urinary tract infection)   . Wears partial dentures     upper and lower  . IBS (irritable bowel syndrome)   . PONV (postoperative nausea and vomiting)   . Fibromyalgia    Active Problems:   OA (osteoarthritis) of knee  Estimated body mass index is 31.19 kg/(m^2) as calculated from the following:   Height as of this encounter: 5\' 1"  (1.549 m).   Weight as of this encounter: 74.844 kg (165 lb). Up with therapy Diet - Cardiac diet Follow up - in 2 weeks Activity - WBAT Disposition - Skilled nursing facility Condition Upon Discharge - pending, possible transfer if improves D/C Meds - See DC Summary DVT Prophylaxis - Xarelto  Arlee Muslim,  PA-C Orthopaedic Surgery 03/05/2016, 7:49 AM

## 2016-03-14 ENCOUNTER — Encounter (HOSPITAL_BASED_OUTPATIENT_CLINIC_OR_DEPARTMENT_OTHER): Payer: Self-pay | Admitting: *Deleted

## 2016-03-14 ENCOUNTER — Emergency Department (HOSPITAL_BASED_OUTPATIENT_CLINIC_OR_DEPARTMENT_OTHER): Payer: Medicare Other

## 2016-03-14 ENCOUNTER — Emergency Department (HOSPITAL_BASED_OUTPATIENT_CLINIC_OR_DEPARTMENT_OTHER)
Admission: EM | Admit: 2016-03-14 | Discharge: 2016-03-14 | Disposition: A | Discharge status: Home / Self Care | Payer: Medicare Other | Attending: Emergency Medicine | Admitting: Emergency Medicine

## 2016-03-14 DIAGNOSIS — S20211A Contusion of right front wall of thorax, initial encounter: Secondary | ICD-10-CM | POA: Insufficient documentation

## 2016-03-14 DIAGNOSIS — I10 Essential (primary) hypertension: Secondary | ICD-10-CM | POA: Diagnosis not present

## 2016-03-14 DIAGNOSIS — F329 Major depressive disorder, single episode, unspecified: Secondary | ICD-10-CM | POA: Insufficient documentation

## 2016-03-14 DIAGNOSIS — W06XXXA Fall from bed, initial encounter: Secondary | ICD-10-CM | POA: Insufficient documentation

## 2016-03-14 DIAGNOSIS — Y929 Unspecified place or not applicable: Secondary | ICD-10-CM | POA: Insufficient documentation

## 2016-03-14 DIAGNOSIS — Y9389 Activity, other specified: Secondary | ICD-10-CM | POA: Insufficient documentation

## 2016-03-14 DIAGNOSIS — E785 Hyperlipidemia, unspecified: Secondary | ICD-10-CM | POA: Diagnosis not present

## 2016-03-14 DIAGNOSIS — Z79899 Other long term (current) drug therapy: Secondary | ICD-10-CM | POA: Insufficient documentation

## 2016-03-14 DIAGNOSIS — Y999 Unspecified external cause status: Secondary | ICD-10-CM | POA: Insufficient documentation

## 2016-03-14 DIAGNOSIS — Z85828 Personal history of other malignant neoplasm of skin: Secondary | ICD-10-CM | POA: Insufficient documentation

## 2016-03-14 DIAGNOSIS — Z87891 Personal history of nicotine dependence: Secondary | ICD-10-CM | POA: Diagnosis not present

## 2016-03-14 DIAGNOSIS — Z7901 Long term (current) use of anticoagulants: Secondary | ICD-10-CM | POA: Diagnosis not present

## 2016-03-14 DIAGNOSIS — Z96653 Presence of artificial knee joint, bilateral: Secondary | ICD-10-CM | POA: Insufficient documentation

## 2016-03-14 DIAGNOSIS — S299XXA Unspecified injury of thorax, initial encounter: Secondary | ICD-10-CM | POA: Diagnosis present

## 2016-03-14 NOTE — Discharge Instructions (Signed)
Please read and follow all provided instructions.  Your diagnoses today include:  1. Chest wall contusion, right, initial encounter     Tests performed today include:  Vital signs. See below for your results today.   X-ray of your chest - no broken bones or problems with the lungs  Medications prescribed:   None   Home care instructions:  Follow any educational materials contained in this packet.  Follow-up instructions: Please follow-up with your primary care provider as needed for further evaluation of your symptoms.  Return instructions:   Please return to the Emergency Department if you experience worsening symptoms.   Return with worsening uncontrolled pain, difficulty breathing.  Please return if you have any other emergent concerns.  Additional Information:  Your vital signs today were: BP 139/65 mmHg   Pulse 93   Temp(Src) 98.1 F (36.7 C) (Oral)   Resp 18   Ht 5\' 1"  (1.549 m)   Wt 74.844 kg   BMI 31.19 kg/m2   SpO2 95% If your blood pressure (BP) was elevated above 135/85 this visit, please have this repeated by your doctor within one month. ---------------

## 2016-03-14 NOTE — ED Provider Notes (Signed)
CSN: [id number]     Arrival date & time 03/14/16  1119 History   First MD Initiated Contact with Patient 03/14/16 1124     Chief Complaint  Patient presents with  . Fall     (Consider location/radiation/quality/duration/timing/severity/associated sxs/prior Treatment) HPI Comments: Patient presents with complaint of right anterior chest wall pain ongoing for approximately 1-1/2 weeks. Patient was hospitalized at the end of June for a left knee replacement surgery. Three days after the surgery, while at rehabilitation, patient states that she fell into a piece of furniture after trying to stand up from the edge of her bed. Since that time she has had pain in her right upper chest that hurts worse with certain positions, movement, and deep breathing. She is not short of breath. She has not had fever. Coughing can sometimes make her symptoms feel little better. She has been weaning off of pain medication and muscle relaxers from her surgery. She states that she has been forced to sleep sitting up for comfort. Onset of symptoms acute. Course is constant. Nothing makes symptoms better.  Patient is a 75 y.o. female presenting with fall. The history is provided by the patient.  Fall Associated symptoms include chest pain. Pertinent negatives include no abdominal pain, coughing, fever, headaches, myalgias, nausea, rash, sore throat or vomiting.    Past Medical History  Diagnosis Date  . Depression   . GERD (gastroesophageal reflux disease)   . Hyperlipidemia   . Hypertension   . Arthritis   . Anxiety   . History of CVA (cerebrovascular accident)     Remote CVA per ct 2009    . Allergic rhinitis   . SUI (stress urinary incontinence, female)   . History of basal cell carcinoma excision     x6  face/ head  . Patellar clunk syndrome of right knee   . History of recurrent UTI (urinary tract infection)   . Wears partial dentures     upper and lower  . IBS (irritable bowel syndrome)   . PONV  (postoperative nausea and vomiting)   . Fibromyalgia    Past Surgical History  Procedure Laterality Date  . Knee arthroscopy Right 12/07/2012    Procedure: RIGHT KNEE ARTHROSCOPY WITH DEBRIDEMENT AND PARTIAL MEDIAL MENISECTOMY  AND CHONDROPLASTY;  Surgeon: Gearlean Alf, MD;  Location: [facility];  Service: Orthopedics;  Laterality: Right;  . Knee arthroscopy Left 2001  . Total knee arthroplasty Right 04/30/2014    Procedure: RIGHT TOTAL KNEE ARTHROPLASTY;  Surgeon: Gearlean Alf, MD;  Location: [facility];  Service: Orthopedics;  Laterality: Right;  . Dilatation & currettage/hysteroscopy with resectocope N/A 11/28/2014    Procedure: DILATATION & CURETTAGE/HYSTEROSCOPY WITH RESECTOCOPE;  Surgeon: Servando Salina, MD;  Location: [facility];  Service: Gynecology;  Laterality: N/A;  . Tonsillectomy  child  . Foot surgery Right 1955    removal of bone spur  . Cataract extraction w/ intraocular lens  implant, bilateral  2001  &  1999  . Knee arthroscopy Right 10/02/2015    Procedure: ARTHROSCOPY RIGHT KNEE WITH SYNOVECTOMY;  Surgeon: Gaynelle Arabian, MD;  Location: [facility];  Service: Orthopedics;  Laterality: Right;  . Total knee arthroplasty Left 03/02/2016    Procedure: TOTAL LEFT KNEE ARTHROPLASTY;  Surgeon: Gaynelle Arabian, MD;  Location: [facility];  Service: Orthopedics;  Laterality: Left;   Family History  Problem Relation Age of Onset  . Cancer Mother     pancreatic   . Diabetes Father   . Heart  disease Father   . Stroke Father    Social History  Substance Use Topics  . Smoking status: Former Smoker -- 1.00 packs/day for 6 years    Types: Cigarettes    Quit date: 09/23/1978  . Smokeless tobacco: Never Used  . Alcohol Use: Yes     Comment: OCCASIONAL   OB History    No data available     Review of Systems  Constitutional: Negative for fever.  HENT: Negative for rhinorrhea and sore throat.   Eyes: Negative for redness.  Respiratory: Negative for cough and chest  tightness.   Cardiovascular: Positive for chest pain. Negative for leg swelling.  Gastrointestinal: Negative for nausea, vomiting, abdominal pain and diarrhea.  Genitourinary: Negative for dysuria.  Musculoskeletal: Negative for myalgias.  Skin: Negative for rash.  Neurological: Negative for headaches.    Allergies  Ciprofloxacin; Codeine; and Sulfa antibiotics  Home Medications   Prior to Admission medications   Medication Sig Start Date End Date Taking? Authorizing Provider  acetaminophen (TYLENOL) 325 MG tablet Take 325-650 mg by mouth every 6 (six) hours as needed (Pain).   Yes Historical Provider, MD  docusate sodium (COLACE) 100 MG capsule Take 1 capsule (100 mg total) by mouth 2 (two) times daily. 03/03/16  Yes Avel Peacerew Perkins, PA-C  FLUoxetine (PROZAC) 20 MG capsule Take 1 capsule (20 mg total) by mouth every morning. 04/17/14  Yes Roderick PeeJeffrey A Todd, MD  hydrochlorothiazide (HYDRODIURIL) 25 MG tablet Take 1 tablet (25 mg total) by mouth every morning. 04/17/14  Yes Roderick PeeJeffrey A Todd, MD  lisinopril (PRINIVIL,ZESTRIL) 40 MG tablet Take 80 mg by mouth every morning.   Yes Historical Provider, MD  loratadine (CLARITIN) 10 MG tablet Take 10 mg by mouth daily as needed for allergies.    Yes Historical Provider, MD  LORazepam (ATIVAN) 0.5 MG tablet TAKE 0.5 TABLET BY MOUTH AT BEDTIME AS NEEDED FOR SLEEP 11/22/14  Yes Roderick PeeJeffrey A Todd, MD  lovastatin (MEVACOR) 40 MG tablet Take 1 tablet (40 mg total) by mouth daily with supper. 04/17/14  Yes Roderick PeeJeffrey A Todd, MD  methocarbamol (ROBAXIN) 500 MG tablet Take 1 tablet (500 mg total) by mouth every 6 (six) hours as needed for muscle spasms. 03/03/16  Yes Avel Peacerew Perkins, PA-C  omeprazole (PRILOSEC) 40 MG capsule Take 40 mg by mouth daily.    Yes Historical Provider, MD  potassium chloride (K-DUR,KLOR-CON) 10 MEQ tablet Take 1 tablet (10 mEq total) by mouth 2 (two) times daily. 04/17/14  Yes Roderick PeeJeffrey A Todd, MD  rivaroxaban (XARELTO) 10 MG TABS tablet Take 1 tablet  (10 mg total) by mouth daily with breakfast. Take Xarelto for two and a half more weeks, then discontinue Xarelto. Once the patient has completed the Xarelto, they may resume the 81 mg Aspirin. 03/03/16  Yes Avel Peacerew Perkins, PA-C  traMADol (ULTRAM) 50 MG tablet Take 1-2 tablets (50-100 mg total) by mouth every 6 (six) hours as needed (mild pain). 03/03/16  Yes Avel Peacerew Perkins, PA-C   BP 139/65 mmHg  Pulse 93  Temp(Src) 98.1 F (36.7 C) (Oral)  Resp 18  Ht 5\' 1"  (1.549 m)  Wt 74.844 kg  BMI 31.19 kg/m2  SpO2 95%   Physical Exam  Constitutional: She appears well-developed and well-nourished.  HENT:  Head: Normocephalic and atraumatic.  Eyes: Conjunctivae are normal. Right eye exhibits no discharge. Left eye exhibits no discharge.  Neck: Normal range of motion. Neck supple.  Cardiovascular: Normal rate, regular rhythm and normal heart sounds.   No murmur  heard. Pulmonary/Chest: Effort normal and breath sounds normal. No respiratory distress. She exhibits tenderness (Right anterior chest wall, no bruising or ecchymosis).  Abdominal: Soft. There is no tenderness.  Musculoskeletal:  Patient with healing left lower extremity arthroplasty surgical wounds with Steri-Strips in place. Expected swelling and bruising noted. No signs of infection.  Neurological: She is alert.  Skin: Skin is warm and dry.  Psychiatric: She has a normal mood and affect.  Nursing note and vitals reviewed.   ED Course  Procedures (including critical care time) Imaging Review Dg Ribs Unilateral W/chest Right  03/14/2016  CLINICAL DATA:  Pain following fall 1 week prior EXAM: RIGHT RIBS AND CHEST - 3+ VIEW COMPARISON:  Chest radiograph March 04, 2016 FINDINGS: Frontal chest as well as oblique and cone-down lower rib images were obtained hip. There is no edema or consolidation. Heart size and pulmonary vascularity are normal. No adenopathy. There is atherosclerotic calcification in the aortic arch region. There is no  demonstrable pneumothorax or pleural effusion. There is no demonstrable rib fracture. IMPRESSION: No demonstrable rib fracture. No edema or consolidation. No pneumothorax. There is aortic atherosclerosis. Electronically Signed   By: Lowella Grip III M.D.   On: 03/14/2016 12:48   I have personally reviewed and evaluated these images and lab results as part of my medical decision-making.   12:05 PM Patient seen and examined. Work-up initiated.   Vital signs reviewed and are as follows: BP 139/65 mmHg  Pulse 93  Temp(Src) 98.1 F (36.7 C) (Oral)  Resp 18  Ht 5\' 1"  (1.549 m)  Wt 74.844 kg  BMI 31.19 kg/m2  SpO2 95%  Patient discussed with and seen by Dr. Billy Fischer. D/c to home with conservative therapy. Follow-up with PCP if not improved.   MDM   Final diagnoses:  Chest wall contusion, right, initial encounter   Patient with reproducible chest wall tenderness after falling onto a piece of furniture greater than one week ago. Imaging is negative. Patient is not having any shortness of breath. Vital signs are stable. Do not suspect ischemic chest pain, infection, other emergent etiology given injury at onset with reproducible chest wall tenderness that is changed with position. Patient otherwise appears well.  Carlisle Cater, PA-C 03/14/16 [place], MD 03/15/16 [phone]

## 2016-03-14 NOTE — ED Notes (Signed)
Pt fell 1 week ago landing on a piece of furniture. Pt c/o pain to R breast area, no bruising noted. Pt denies SOB. Reports pain increases with movement and coughing.

## 2016-03-14 NOTE — ED Notes (Signed)
Per pt report has recent lt knee replacement was at [facility] to recover, tried to get OOB while attempting to got to the bathroom, to stop fall hit chest into a wooden railing on the rt side. This all occurred on the 28 th of June . Reports soreness and tightness pain when getting up using walker.

## 2016-04-28 ENCOUNTER — Encounter (HOSPITAL_COMMUNITY): Payer: Self-pay

## 2016-04-28 ENCOUNTER — Emergency Department (HOSPITAL_COMMUNITY): Payer: Medicare Other

## 2016-04-28 ENCOUNTER — Emergency Department (HOSPITAL_COMMUNITY)
Admission: EM | Admit: 2016-04-28 | Discharge: 2016-04-28 | Disposition: A | Discharge status: Home / Self Care | Payer: Medicare Other | Attending: Emergency Medicine | Admitting: Emergency Medicine

## 2016-04-28 DIAGNOSIS — Z87891 Personal history of nicotine dependence: Secondary | ICD-10-CM | POA: Insufficient documentation

## 2016-04-28 DIAGNOSIS — I10 Essential (primary) hypertension: Secondary | ICD-10-CM | POA: Diagnosis not present

## 2016-04-28 DIAGNOSIS — R1013 Epigastric pain: Secondary | ICD-10-CM | POA: Diagnosis present

## 2016-04-28 DIAGNOSIS — K219 Gastro-esophageal reflux disease without esophagitis: Secondary | ICD-10-CM | POA: Insufficient documentation

## 2016-04-28 DIAGNOSIS — Z79899 Other long term (current) drug therapy: Secondary | ICD-10-CM | POA: Diagnosis not present

## 2016-04-28 LAB — CBC WITH DIFFERENTIAL/PLATELET
BASOS ABS: 0 10*3/uL (ref 0.0–0.1)
Basophils Relative: 0 %
EOS ABS: 0.1 10*3/uL (ref 0.0–0.7)
EOS PCT: 1 %
HCT: 38.2 % (ref 36.0–46.0)
Hemoglobin: 13 g/dL (ref 12.0–15.0)
LYMPHS PCT: 11 %
Lymphs Abs: 1.9 10*3/uL (ref 0.7–4.0)
MCH: 31.3 pg (ref 26.0–34.0)
MCHC: 34 g/dL (ref 30.0–36.0)
MCV: 92 fL (ref 78.0–100.0)
MONO ABS: 1.5 10*3/uL — AB (ref 0.1–1.0)
Monocytes Relative: 8 %
Neutro Abs: 14.3 10*3/uL — ABNORMAL HIGH (ref 1.7–7.7)
Neutrophils Relative %: 80 %
PLATELETS: 394 10*3/uL (ref 150–400)
RBC: 4.15 MIL/uL (ref 3.87–5.11)
RDW: 16.2 % — AB (ref 11.5–15.5)
WBC: 17.9 10*3/uL — ABNORMAL HIGH (ref 4.0–10.5)

## 2016-04-28 LAB — COMPREHENSIVE METABOLIC PANEL
ALT: 36 U/L (ref 14–54)
AST: 49 U/L — AB (ref 15–41)
Albumin: 4.3 g/dL (ref 3.5–5.0)
Alkaline Phosphatase: 56 U/L (ref 38–126)
Anion gap: 8 (ref 5–15)
BUN: 13 mg/dL (ref 6–20)
CHLORIDE: 92 mmol/L — AB (ref 101–111)
CO2: 30 mmol/L (ref 22–32)
Calcium: 9.3 mg/dL (ref 8.9–10.3)
Creatinine, Ser: 0.55 mg/dL (ref 0.44–1.00)
Glucose, Bld: 127 mg/dL — ABNORMAL HIGH (ref 65–99)
POTASSIUM: 3.4 mmol/L — AB (ref 3.5–5.1)
Sodium: 130 mmol/L — ABNORMAL LOW (ref 135–145)
Total Bilirubin: 1.1 mg/dL (ref 0.3–1.2)
Total Protein: 7.6 g/dL (ref 6.5–8.1)

## 2016-04-28 LAB — LIPASE, BLOOD: LIPASE: 37 U/L (ref 11–51)

## 2016-04-28 LAB — TROPONIN I

## 2016-04-28 MED ORDER — SODIUM CHLORIDE 0.9 % IV SOLN
INTRAVENOUS | Status: DC
  Administered 2016-04-28: 10 mL/h via INTRAVENOUS

## 2016-04-28 MED ORDER — GI COCKTAIL ~~LOC~~
30.0000 mL | Freq: Once | ORAL | Status: AC
  Administered 2016-04-28: 30 mL via ORAL
  Filled 2016-04-28: qty 30

## 2016-04-28 MED ORDER — PANTOPRAZOLE SODIUM 20 MG PO TBEC: 20.0000 mg | DELAYED_RELEASE_TABLET | Freq: Every day | ORAL | 0 refills | Status: DC

## 2016-04-28 MED ORDER — SUCRALFATE 1 G PO TABS: 1.0000 g | ORAL_TABLET | Freq: Four times a day (QID) | ORAL | 0 refills | Status: DC

## 2016-04-28 MED ORDER — SUCRALFATE 1 G PO TABS
1.0000 g | ORAL_TABLET | Freq: Once | ORAL | Status: AC
  Administered 2016-04-28: 1 g via ORAL
  Filled 2016-04-28: qty 1

## 2016-04-28 MED ORDER — FAMOTIDINE 20 MG PO TABS
40.0000 mg | ORAL_TABLET | Freq: Once | ORAL | Status: AC
  Administered 2016-04-28: 40 mg via ORAL
  Filled 2016-04-28: qty 2

## 2016-04-28 NOTE — ED Notes (Signed)
Bed: WA13 Expected date:  Expected time:  Means of arrival:  Comments: Triage 1 

## 2016-04-28 NOTE — ED Provider Notes (Signed)
[facility] DEPT Provider Note   CSN: [id number] Arrival date & time: 04/28/16  1237     History   Chief Complaint Chief Complaint  Patient presents with  . Abdominal Pain  . Nausea    HPI Marie Harrison is a 75 y.o. female.  75 year old female presents with sudden onset of epigastric pain that began 2 hours after she had a large breakfast. Pain characterizes burning and nonradiating. Some associated nausea but no emesis. No diaphoresis noted. Has had increased constipation recently. Does have a history of GERD as well as hiatal hernia. Patient did use Tums without relief. Denies any palpitations, syncope. No dark or bloody stools. Symptoms have been persistent for the past 4 hours but gradually improved. No prior history of same. Denies any history of coronary disease      Past Medical History:  Diagnosis Date  . Allergic rhinitis   . Anxiety   . Arthritis   . Depression   . Fibromyalgia   . GERD (gastroesophageal reflux disease)   . History of basal cell carcinoma excision    x6  face/ head  . History of CVA (cerebrovascular accident)    Remote CVA per ct 2009    . History of recurrent UTI (urinary tract infection)   . Hyperlipidemia   . Hypertension   . IBS (irritable bowel syndrome)   . Patellar clunk syndrome of right knee   . PONV (postoperative nausea and vomiting)   . SUI (stress urinary incontinence, female)   . Wears partial dentures    upper and lower    Patient Active Problem List   Diagnosis Date Noted  . Pain in joint, ankle and foot 11/26/2015  . 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 Hyponatremia 05/01/2014  . Hypokalemia 05/01/2014  . OA (osteoarthritis) of knee 04/30/2014  . Dyshidrotic eczema 01/23/2014  . Acute medial meniscal tear 12/06/2012  . Allergic rhinitis due to pollen 02/11/2012  . Osteoarthritis 02/11/2012  . Viral URI 11/02/2011  . Asthma, mild 11/02/2011  . LEUKOPLAKIA, ORAL MUCOSA  03/18/2010  . CONTACT DERMATITIS&OTHER ECZEMA DUE DETERGENTS 10/01/2009  . Hyperlipidemia 08/06/2009  . Depression 08/06/2009  . Essential hypertension 08/06/2009  . GERD 08/06/2009  . URINARY INCONTINENCE 08/06/2009  . CEREBROVASCULAR ACCIDENT, HX OF 08/06/2009    Past Surgical History:  Procedure Laterality Date  . CATARACT EXTRACTION W/ INTRAOCULAR LENS  IMPLANT, BILATERAL  2001  &  1999  . DILATATION & CURRETTAGE/HYSTEROSCOPY WITH RESECTOCOPE N/A 11/28/2014   Procedure: DILATATION & CURETTAGE/HYSTEROSCOPY WITH RESECTOCOPE;  Surgeon: Servando Salina, MD;  Location: [facility];  Service: Gynecology;  Laterality: N/A;  . FOOT SURGERY Right 1955   removal of bone spur  . JOINT REPLACEMENT    . KNEE ARTHROSCOPY Right 12/07/2012   Procedure: RIGHT KNEE ARTHROSCOPY WITH DEBRIDEMENT AND PARTIAL MEDIAL MENISECTOMY  AND CHONDROPLASTY;  Surgeon: Gearlean Alf, MD;  Location: [facility];  Service: Orthopedics;  Laterality: Right;  . KNEE ARTHROSCOPY Left 2001  . KNEE ARTHROSCOPY Right 10/02/2015   Procedure: ARTHROSCOPY RIGHT KNEE WITH SYNOVECTOMY;  Surgeon: Gaynelle Arabian, MD;  Location: [facility];  Service: Orthopedics;  Laterality: Right;  . TONSILLECTOMY  child  . TOTAL KNEE ARTHROPLASTY Right 04/30/2014   Procedure: RIGHT TOTAL KNEE ARTHROPLASTY;  Surgeon: Gearlean Alf, MD;  Location: [facility];  Service: Orthopedics;  Laterality: Right;  . TOTAL KNEE ARTHROPLASTY Left 03/02/2016   Procedure: TOTAL LEFT KNEE ARTHROPLASTY;  Surgeon: Gaynelle Arabian, MD;  Location: [facility];  Service: Orthopedics;  Laterality: Left;    OB History    No data available       Home Medications    Prior to Admission medications   Medication Sig Start Date End Date Taking? Authorizing Provider  acetaminophen (TYLENOL) 325 MG tablet Take 325-650 mg by mouth every 6 (six) hours as needed (Pain).    Historical Provider, MD  docusate sodium (COLACE) 100 MG capsule Take 1 capsule (100 mg total) by mouth 2  (two) times daily. 03/03/16   Arlee Muslim, PA-C  FLUoxetine (PROZAC) 20 MG capsule Take 1 capsule (20 mg total) by mouth every morning. 04/17/14   Dorena Cookey, MD  hydrochlorothiazide (HYDRODIURIL) 25 MG tablet Take 1 tablet (25 mg total) by mouth every morning. 04/17/14   Dorena Cookey, MD  lisinopril (PRINIVIL,ZESTRIL) 40 MG tablet Take 80 mg by mouth every morning.    Historical Provider, MD  loratadine (CLARITIN) 10 MG tablet Take 10 mg by mouth daily as needed for allergies.     Historical Provider, MD  LORazepam (ATIVAN) 0.5 MG tablet TAKE 0.5 TABLET BY MOUTH AT BEDTIME AS NEEDED FOR SLEEP 11/22/14   Dorena Cookey, MD  lovastatin (MEVACOR) 40 MG tablet Take 1 tablet (40 mg total) by mouth daily with supper. 04/17/14   Dorena Cookey, MD  methocarbamol (ROBAXIN) 500 MG tablet Take 1 tablet (500 mg total) by mouth every 6 (six) hours as needed for muscle spasms. 03/03/16   Arlee Muslim, PA-C  omeprazole (PRILOSEC) 40 MG capsule Take 40 mg by mouth daily.     Historical Provider, MD  potassium chloride (K-DUR,KLOR-CON) 10 MEQ tablet Take 1 tablet (10 mEq total) by mouth 2 (two) times daily. 04/17/14   Dorena Cookey, MD  rivaroxaban (XARELTO) 10 MG TABS tablet Take 1 tablet (10 mg total) by mouth daily with breakfast. Take Xarelto for two and a half more weeks, then discontinue Xarelto. Once the patient has completed the Xarelto, they may resume the 81 mg Aspirin. 03/03/16   Arlee Muslim, PA-C  traMADol (ULTRAM) 50 MG tablet Take 1-2 tablets (50-100 mg total) by mouth every 6 (six) hours as needed (mild pain). 03/03/16   Arlee Muslim, PA-C    Family History Family History  Problem Relation Age of Onset  . Cancer Mother     pancreatic   . Diabetes Father   . Heart disease Father   . Stroke Father     Social History Social History  Substance Use Topics  . Smoking status: Former Smoker    Packs/day: 1.00    Years: 6.00    Types: Cigarettes    Quit date: 09/23/1978  . Smokeless tobacco:  Never Used  . Alcohol use Yes     Comment: OCCASIONAL     Allergies   Ciprofloxacin; Codeine; and Sulfa antibiotics   Review of Systems Review of Systems  All other systems reviewed and are negative.    Physical Exam Updated Vital Signs BP 172/69   Pulse 65   Temp 98.8 F (37.1 C) (Oral)   Resp 20   SpO2 98%   Physical Exam  Constitutional: She is oriented to person, place, and time. She appears well-developed and well-nourished.  Non-toxic appearance. No distress.  HENT:  Head: Normocephalic and atraumatic.  Eyes: Conjunctivae, EOM and lids are normal. Pupils are equal, round, and reactive to light.  Neck: Normal range of motion. Neck supple. No tracheal deviation present. No thyroid mass present.  Cardiovascular:  Normal rate, regular rhythm and normal heart sounds.  Exam reveals no gallop.   No murmur heard. Pulmonary/Chest: Effort normal and breath sounds normal. No stridor. No respiratory distress. She has no decreased breath sounds. She has no wheezes. She has no rhonchi. She has no rales.  Abdominal: Soft. Normal appearance and bowel sounds are normal. She exhibits no distension. There is no hepatosplenomegaly. There is tenderness in the epigastric area. There is no rigidity, no rebound, no guarding and no CVA tenderness. No hernia.    Musculoskeletal: Normal range of motion. She exhibits no edema or tenderness.  Neurological: She is alert and oriented to person, place, and time. She has normal strength. No cranial nerve deficit or sensory deficit. GCS eye subscore is 4. GCS verbal subscore is 5. GCS motor subscore is 6.  Skin: Skin is warm and dry. No abrasion and no rash noted.  Psychiatric: She has a normal mood and affect. Her speech is normal and behavior is normal.  Nursing note and vitals reviewed.    ED Treatments / Results  Labs (all labs ordered are listed, but only abnormal results are displayed) Labs Reviewed  CBC WITH DIFFERENTIAL/PLATELET    COMPREHENSIVE METABOLIC PANEL  LIPASE, BLOOD  TROPONIN I    EKG  EKG Interpretation None       Radiology No results found.  Procedures Procedures (including critical care time)  Medications Ordered in ED Medications  0.9 %  sodium chloride infusion (not administered)  gi cocktail (Maalox,Lidocaine,Donnatal) (not administered)  famotidine (PEPCID) tablet 40 mg (not administered)  sucralfate (CARAFATE) tablet 1 g (not administered)     Initial Impression / Assessment and Plan / ED Course  I have reviewed the triage vital signs and the nursing notes.  Pertinent labs & imaging results that were available during my care of the patient were reviewed by me and considered in my medical decision making (see chart for details).  Clinical Course    ED ECG REPORT   Date: 04/28/2016  Rate: 76  Rhythm: normal sinus rhythm  QRS Axis: normal  Intervals: normal  ST/T Wave abnormalities: normal  Conduction Disutrbances:none  Narrative Interpretation:   Old EKG Reviewed: unchanged  I have personally reviewed the EKG tracing and agree with the computerized printout as noted.  Final Clinical Impressions(s) / ED Diagnoses   Final diagnoses:  None   Patient treated here with Carafate and GI cocktail and feels better. Suspect that her current symptoms of her GERD and doubt that this is ACS. Will prescribe meds and return precautions given New Prescriptions New Prescriptions   No medications on file     Lacretia Leigh, MD 04/28/16 [phone]

## 2016-04-28 NOTE — ED Notes (Signed)
Patient was alert, oriented and stable upon discharge. RN went over AVS and patient had no further questions.  

## 2016-04-28 NOTE — ED Triage Notes (Addendum)
Pt c/o epigastric pain and nausea starting around 1000.  Sts tightness in epigastric region and mid back x 1 episode earlier which resolved.  Pain score 8/10.  Pt reports taking Tums w/o relief.  Denies diarrhea.  Hx of GERD.     Pt reports total knee replacement x 2 months ago.

## 2016-07-24 ENCOUNTER — Other Ambulatory Visit: Payer: Self-pay | Admitting: Nurse Practitioner

## 2016-07-24 ENCOUNTER — Other Ambulatory Visit: Payer: Self-pay | Admitting: Internal Medicine

## 2016-07-24 DIAGNOSIS — Z1231 Encounter for screening mammogram for malignant neoplasm of breast: Secondary | ICD-10-CM

## 2016-09-10 ENCOUNTER — Ambulatory Visit: Payer: Medicare Other

## 2016-10-01 ENCOUNTER — Ambulatory Visit
Admission: RE | Admit: 2016-10-01 | Discharge: 2016-10-01 | Disposition: A | Discharge status: Home / Self Care | Payer: Medicare Other | Source: Ambulatory Visit | Attending: Nurse Practitioner | Admitting: Nurse Practitioner

## 2016-10-01 DIAGNOSIS — Z1231 Encounter for screening mammogram for malignant neoplasm of breast: Secondary | ICD-10-CM

## 2017-06-04 ENCOUNTER — Emergency Department (HOSPITAL_BASED_OUTPATIENT_CLINIC_OR_DEPARTMENT_OTHER)
Admission: EM | Admit: 2017-06-04 | Discharge: 2017-06-04 | Disposition: A | Discharge status: Home / Self Care | Payer: Medicare Other | Attending: Emergency Medicine | Admitting: Emergency Medicine

## 2017-06-04 ENCOUNTER — Encounter (HOSPITAL_BASED_OUTPATIENT_CLINIC_OR_DEPARTMENT_OTHER): Payer: Self-pay | Admitting: Emergency Medicine

## 2017-06-04 DIAGNOSIS — R030 Elevated blood-pressure reading, without diagnosis of hypertension: Secondary | ICD-10-CM | POA: Insufficient documentation

## 2017-06-04 DIAGNOSIS — W19XXXA Unspecified fall, initial encounter: Secondary | ICD-10-CM | POA: Diagnosis not present

## 2017-06-04 DIAGNOSIS — Z5321 Procedure and treatment not carried out due to patient leaving prior to being seen by health care provider: Secondary | ICD-10-CM | POA: Insufficient documentation

## 2017-06-04 NOTE — ED Triage Notes (Signed)
Pt tripped and fell while trying to get on the elevator where she lives. Pt c/o L hip pain initially but states it has improved. Pt states EMS was called and found her to be hypertensive and told her she should be evaluated. EMS got a BP of 180/100.

## 2017-09-16 ENCOUNTER — Other Ambulatory Visit: Payer: Self-pay | Admitting: Nurse Practitioner

## 2017-09-16 DIAGNOSIS — Z1231 Encounter for screening mammogram for malignant neoplasm of breast: Secondary | ICD-10-CM

## 2017-10-06 ENCOUNTER — Ambulatory Visit
Admission: RE | Admit: 2017-10-06 | Discharge: 2017-10-06 | Disposition: A | Discharge status: Home / Self Care | Payer: Medicare Other | Source: Ambulatory Visit | Attending: Nurse Practitioner | Admitting: Nurse Practitioner

## 2017-10-06 DIAGNOSIS — Z1231 Encounter for screening mammogram for malignant neoplasm of breast: Secondary | ICD-10-CM

## 2020-04-04 ENCOUNTER — Other Ambulatory Visit: Payer: Self-pay | Admitting: Urology

## 2020-04-20 ENCOUNTER — Other Ambulatory Visit (HOSPITAL_COMMUNITY): Payer: Medicare Other

## 2020-05-09 ENCOUNTER — Encounter (HOSPITAL_BASED_OUTPATIENT_CLINIC_OR_DEPARTMENT_OTHER): Payer: Self-pay | Admitting: Urology

## 2020-05-10 ENCOUNTER — Other Ambulatory Visit: Payer: Self-pay

## 2020-05-10 ENCOUNTER — Encounter (HOSPITAL_BASED_OUTPATIENT_CLINIC_OR_DEPARTMENT_OTHER): Payer: Self-pay | Admitting: Urology

## 2020-05-10 NOTE — Progress Notes (Addendum)
Spoke w/ via phone for pre-op interview---Pt Lab needs dos----   I stat 8, ekg            Lab results------none COVID test ------05-11-2020 Arrive at -------630 am 05-15-2020 NPO after MN NO Solid Food.  Clear liquids from MN until---530 am then npo Medications to take morning of surgery -----flonase prn, famotidine, fexofexadine Diabetic medication -----n/a Patient Special Instructions -----pt instructed to stop 81 mg aspirin 3 days before surgery per dr bell Pre-Op special Istructions -----none Patient verbalized understanding of instructions that were given at this phone interview. Patient denies shortness of breath, chest pain, fever, cough at this phone interview.  Pt lives at [facility] with spouse

## 2020-05-11 ENCOUNTER — Encounter (HOSPITAL_COMMUNITY): Payer: Medicare Other

## 2020-05-11 ENCOUNTER — Other Ambulatory Visit (HOSPITAL_COMMUNITY)
Admission: RE | Admit: 2020-05-11 | Discharge: 2020-05-11 | Disposition: A | Discharge status: Home / Self Care | Payer: Medicare Other | Source: Ambulatory Visit | Attending: Urology | Admitting: Urology

## 2020-05-11 DIAGNOSIS — Z20822 Contact with and (suspected) exposure to covid-19: Secondary | ICD-10-CM | POA: Diagnosis not present

## 2020-05-11 DIAGNOSIS — Z01812 Encounter for preprocedural laboratory examination: Secondary | ICD-10-CM | POA: Diagnosis present

## 2020-05-11 LAB — SARS CORONAVIRUS 2 (TAT 6-24 HRS): SARS Coronavirus 2: NEGATIVE

## 2020-05-14 NOTE — Progress Notes (Signed)
Patient reports temperature 99.0 but is feeling better now, patient instructed to call [facility] if temperature 100.0 or above tomorrow or symptoms worsen, and to also call dr bell.

## 2020-05-15 ENCOUNTER — Ambulatory Visit (HOSPITAL_BASED_OUTPATIENT_CLINIC_OR_DEPARTMENT_OTHER): Payer: Medicare Other | Admitting: Anesthesiology

## 2020-05-15 ENCOUNTER — Ambulatory Visit (HOSPITAL_BASED_OUTPATIENT_CLINIC_OR_DEPARTMENT_OTHER)
Admission: RE | Admit: 2020-05-15 | Discharge: 2020-05-15 | Disposition: A | Discharge status: Home / Self Care | Payer: Medicare Other | Attending: Urology | Admitting: Urology

## 2020-05-15 ENCOUNTER — Encounter (HOSPITAL_BASED_OUTPATIENT_CLINIC_OR_DEPARTMENT_OTHER): Payer: Self-pay | Admitting: Urology

## 2020-05-15 ENCOUNTER — Encounter (HOSPITAL_BASED_OUTPATIENT_CLINIC_OR_DEPARTMENT_OTHER)
Admission: RE | Disposition: A | Discharge status: Home / Self Care | Payer: Self-pay | Source: Home / Self Care | Attending: Urology

## 2020-05-15 DIAGNOSIS — J45909 Unspecified asthma, uncomplicated: Secondary | ICD-10-CM | POA: Insufficient documentation

## 2020-05-15 DIAGNOSIS — E785 Hyperlipidemia, unspecified: Secondary | ICD-10-CM | POA: Diagnosis not present

## 2020-05-15 DIAGNOSIS — Z87891 Personal history of nicotine dependence: Secondary | ICD-10-CM | POA: Insufficient documentation

## 2020-05-15 DIAGNOSIS — Z8673 Personal history of transient ischemic attack (TIA), and cerebral infarction without residual deficits: Secondary | ICD-10-CM | POA: Insufficient documentation

## 2020-05-15 DIAGNOSIS — Z792 Long term (current) use of antibiotics: Secondary | ICD-10-CM | POA: Diagnosis not present

## 2020-05-15 DIAGNOSIS — Z96653 Presence of artificial knee joint, bilateral: Secondary | ICD-10-CM | POA: Insufficient documentation

## 2020-05-15 DIAGNOSIS — Z79899 Other long term (current) drug therapy: Secondary | ICD-10-CM | POA: Diagnosis not present

## 2020-05-15 DIAGNOSIS — F419 Anxiety disorder, unspecified: Secondary | ICD-10-CM | POA: Insufficient documentation

## 2020-05-15 DIAGNOSIS — M1990 Unspecified osteoarthritis, unspecified site: Secondary | ICD-10-CM | POA: Insufficient documentation

## 2020-05-15 DIAGNOSIS — Z7982 Long term (current) use of aspirin: Secondary | ICD-10-CM | POA: Diagnosis not present

## 2020-05-15 DIAGNOSIS — I10 Essential (primary) hypertension: Secondary | ICD-10-CM | POA: Diagnosis not present

## 2020-05-15 DIAGNOSIS — F329 Major depressive disorder, single episode, unspecified: Secondary | ICD-10-CM | POA: Insufficient documentation

## 2020-05-15 DIAGNOSIS — K219 Gastro-esophageal reflux disease without esophagitis: Secondary | ICD-10-CM | POA: Insufficient documentation

## 2020-05-15 DIAGNOSIS — C674 Malignant neoplasm of posterior wall of bladder: Secondary | ICD-10-CM | POA: Insufficient documentation

## 2020-05-15 DIAGNOSIS — D494 Neoplasm of unspecified behavior of bladder: Secondary | ICD-10-CM | POA: Diagnosis present

## 2020-05-15 HISTORY — PX: TRANSURETHRAL RESECTION OF BLADDER TUMOR WITH MITOMYCIN-C: SHX6459

## 2020-05-15 HISTORY — DX: Neoplasm of unspecified behavior of bladder: D49.4

## 2020-05-15 HISTORY — DX: Age-related osteoporosis without current pathological fracture: M81.0

## 2020-05-15 HISTORY — DX: Dermatitis, unspecified: L30.9

## 2020-05-15 HISTORY — DX: Other allergy status, other than to drugs and biological substances: Z91.09

## 2020-05-15 HISTORY — DX: Unspecified asthma, uncomplicated: J45.909

## 2020-05-15 LAB — POCT I-STAT, CHEM 8
BUN: 10 mg/dL (ref 8–23)
Calcium, Ion: 1.25 mmol/L (ref 1.15–1.40)
Chloride: 95 mmol/L — ABNORMAL LOW (ref 98–111)
Creatinine, Ser: 0.5 mg/dL (ref 0.44–1.00)
Glucose, Bld: 106 mg/dL — ABNORMAL HIGH (ref 70–99)
HCT: 38 % (ref 36.0–46.0)
Hemoglobin: 12.9 g/dL (ref 12.0–15.0)
Potassium: 3.6 mmol/L (ref 3.5–5.1)
Sodium: 135 mmol/L (ref 135–145)
TCO2: 27 mmol/L (ref 22–32)

## 2020-05-15 SURGERY — TRANSURETHRAL RESECTION OF BLADDER TUMOR WITH MITOMYCIN-C
Anesthesia: General | Site: Bladder

## 2020-05-15 MED ORDER — LIDOCAINE 2% (20 MG/ML) 5 ML SYRINGE
INTRAMUSCULAR | Status: DC | PRN
  Administered 2020-05-15: 60 mg via INTRAVENOUS

## 2020-05-15 MED ORDER — FENTANYL CITRATE (PF) 100 MCG/2ML IJ SOLN
INTRAMUSCULAR | Status: AC
  Filled 2020-05-15: qty 2

## 2020-05-15 MED ORDER — SODIUM CHLORIDE 0.9 % IR SOLN
Status: DC | PRN
  Administered 2020-05-15: 3000 mL

## 2020-05-15 MED ORDER — ONDANSETRON HCL 4 MG/2ML IJ SOLN
INTRAMUSCULAR | Status: DC | PRN
  Administered 2020-05-15: 4 mg via INTRAVENOUS

## 2020-05-15 MED ORDER — PROPOFOL 10 MG/ML IV BOLUS
INTRAVENOUS | Status: DC | PRN
  Administered 2020-05-15: 130 mg via INTRAVENOUS

## 2020-05-15 MED ORDER — PROPOFOL 500 MG/50ML IV EMUL
INTRAVENOUS | Status: AC
  Filled 2020-05-15: qty 50

## 2020-05-15 MED ORDER — LIDOCAINE 2% (20 MG/ML) 5 ML SYRINGE
INTRAMUSCULAR | Status: AC
  Filled 2020-05-15: qty 5

## 2020-05-15 MED ORDER — CEFAZOLIN SODIUM-DEXTROSE 2-4 GM/100ML-% IV SOLN
2.0000 g | INTRAVENOUS | Status: AC
  Administered 2020-05-15: 2 g via INTRAVENOUS

## 2020-05-15 MED ORDER — FENTANYL CITRATE (PF) 100 MCG/2ML IJ SOLN
INTRAMUSCULAR | Status: DC | PRN
Start: 2020-05-15 — End: 2020-05-15
  Administered 2020-05-15: 100 ug via INTRAVENOUS

## 2020-05-15 MED ORDER — LACTATED RINGERS IV SOLN: INTRAVENOUS | Status: DC

## 2020-05-15 MED ORDER — ROCURONIUM BROMIDE 10 MG/ML (PF) SYRINGE
PREFILLED_SYRINGE | INTRAVENOUS | Status: DC | PRN
  Administered 2020-05-15: 70 mg via INTRAVENOUS

## 2020-05-15 MED ORDER — ROCURONIUM BROMIDE 10 MG/ML (PF) SYRINGE
PREFILLED_SYRINGE | INTRAVENOUS | Status: AC
  Filled 2020-05-15: qty 10

## 2020-05-15 MED ORDER — PHENYLEPHRINE 40 MCG/ML (10ML) SYRINGE FOR IV PUSH (FOR BLOOD PRESSURE SUPPORT)
PREFILLED_SYRINGE | INTRAVENOUS | Status: AC
  Filled 2020-05-15: qty 10

## 2020-05-15 MED ORDER — HYDROCODONE-ACETAMINOPHEN 5-325 MG PO TABS
1.0000 | ORAL_TABLET | ORAL | 0 refills | Status: DC | PRN
Start: 2020-05-15 — End: 2024-03-03

## 2020-05-15 MED ORDER — ACETAMINOPHEN 500 MG PO TABS
ORAL_TABLET | ORAL | Status: AC
  Filled 2020-05-15: qty 2

## 2020-05-15 MED ORDER — FENTANYL CITRATE (PF) 100 MCG/2ML IJ SOLN
25.0000 ug | INTRAMUSCULAR | Status: DC | PRN
  Administered 2020-05-15 (×4): 25 ug via INTRAVENOUS

## 2020-05-15 MED ORDER — GEMCITABINE CHEMO FOR BLADDER INSTILLATION 2000 MG
2000.0000 mg | Freq: Once | INTRAVENOUS | Status: AC
  Administered 2020-05-15: 2000 mg via INTRAVESICAL
  Filled 2020-05-15: qty 2000

## 2020-05-15 MED ORDER — DEXAMETHASONE SODIUM PHOSPHATE 10 MG/ML IJ SOLN
INTRAMUSCULAR | Status: AC
  Filled 2020-05-15: qty 1

## 2020-05-15 MED ORDER — ACETAMINOPHEN 500 MG PO TABS
1000.0000 mg | ORAL_TABLET | Freq: Once | ORAL | Status: AC
  Administered 2020-05-15: 1000 mg via ORAL

## 2020-05-15 MED ORDER — DEXAMETHASONE SODIUM PHOSPHATE 4 MG/ML IJ SOLN
INTRAMUSCULAR | Status: DC | PRN
  Administered 2020-05-15: 10 mg via INTRAVENOUS

## 2020-05-15 MED ORDER — CEFAZOLIN SODIUM-DEXTROSE 2-4 GM/100ML-% IV SOLN
INTRAVENOUS | Status: AC
  Filled 2020-05-15: qty 100

## 2020-05-15 MED ORDER — SUGAMMADEX SODIUM 200 MG/2ML IV SOLN
INTRAVENOUS | Status: DC | PRN
  Administered 2020-05-15: 200 mg via INTRAVENOUS

## 2020-05-15 MED ORDER — PHENYLEPHRINE 40 MCG/ML (10ML) SYRINGE FOR IV PUSH (FOR BLOOD PRESSURE SUPPORT)
PREFILLED_SYRINGE | INTRAVENOUS | Status: DC | PRN
  Administered 2020-05-15: 80 ug via INTRAVENOUS

## 2020-05-15 MED ORDER — ONDANSETRON HCL 4 MG/2ML IJ SOLN
INTRAMUSCULAR | Status: AC
  Filled 2020-05-15: qty 2

## 2020-05-15 SURGICAL SUPPLY — 29 items
BAG DRAIN URO-CYSTO SKYTR STRL (DRAIN) ×2 IMPLANT
BAG DRN RND TRDRP ANRFLXCHMBR (UROLOGICAL SUPPLIES) ×1
BAG DRN UROCATH (DRAIN) ×1
BAG URINE DRAIN 2000ML AR STRL (UROLOGICAL SUPPLIES) ×1 IMPLANT
BAG URINE LEG 500ML (DRAIN) IMPLANT
CATH FOLEY 2WAY SLVR  5CC 18FR (CATHETERS) ×2
CATH FOLEY 2WAY SLVR  5CC 22FR (CATHETERS)
CATH FOLEY 2WAY SLVR 30CC 20FR (CATHETERS) IMPLANT
CATH FOLEY 2WAY SLVR 5CC 18FR (CATHETERS) IMPLANT
CATH FOLEY 2WAY SLVR 5CC 22FR (CATHETERS) IMPLANT
CLOTH BEACON ORANGE TIMEOUT ST (SAFETY) ×2 IMPLANT
ELECT REM PT RETURN 9FT ADLT (ELECTROSURGICAL) ×2
ELECTRODE REM PT RTRN 9FT ADLT (ELECTROSURGICAL) IMPLANT
EVACUATOR MICROVAS BLADDER (UROLOGICAL SUPPLIES) IMPLANT
GLOVE BIO SURGEON STRL SZ7.5 (GLOVE) ×2 IMPLANT
GLOVE BIOGEL PI IND STRL 7.0 (GLOVE) IMPLANT
GLOVE BIOGEL PI INDICATOR 7.0 (GLOVE) ×2
GOWN STRL REUS W/ TWL LRG LVL3 (GOWN DISPOSABLE) ×1 IMPLANT
GOWN STRL REUS W/TWL LRG LVL3 (GOWN DISPOSABLE) ×3 IMPLANT
HOLDER FOLEY CATH W/STRAP (MISCELLANEOUS) ×1 IMPLANT
IV NS IRRIG 3000ML ARTHROMATIC (IV SOLUTION) ×2 IMPLANT
KIT TURNOVER CYSTO (KITS) ×2 IMPLANT
LOOP CUT BIPOLAR 24F LRG (ELECTROSURGICAL) ×1 IMPLANT
MANIFOLD NEPTUNE II (INSTRUMENTS) ×2 IMPLANT
PACK CYSTO (CUSTOM PROCEDURE TRAY) ×2 IMPLANT
SYR TOOMEY IRRIG 70ML (MISCELLANEOUS)
SYRINGE TOOMEY IRRIG 70ML (MISCELLANEOUS) IMPLANT
TUBE CONNECTING 12X1/4 (SUCTIONS) IMPLANT
WATER STERILE IRR 500ML POUR (IV SOLUTION) ×1 IMPLANT

## 2020-05-15 NOTE — Anesthesia Preprocedure Evaluation (Addendum)
Anesthesia Evaluation  Patient identified by MRN, date of birth, ID band Patient awake    Reviewed: Allergy & Precautions, NPO status , Patient's Chart, lab work & pertinent test results  History of Anesthesia Complications (+) PONV  Airway Mallampati: II  TM Distance: >3 FB Neck ROM: Full    Dental no notable dental hx. (+) Partial Upper, Partial Lower,  Upper palate large tori:   Pulmonary asthma , former smoker,    Pulmonary exam normal breath sounds clear to auscultation       Cardiovascular hypertension, Pt. on medications negative cardio ROS Normal cardiovascular exam Rhythm:Regular Rate:Normal  HLD   Neuro/Psych PSYCHIATRIC DISORDERS Anxiety Depression CVA (2009), No Residual Symptoms    GI/Hepatic Neg liver ROS, GERD  Medicated,  Endo/Other  negative endocrine ROS  Renal/GU negative Renal ROS  negative genitourinary   Musculoskeletal  (+) Arthritis , Fibromyalgia -  Abdominal   Peds  Hematology negative hematology ROS (+)   Anesthesia Other Findings Bladder mass  Reproductive/Obstetrics                           Anesthesia Physical Anesthesia Plan  ASA: III  Anesthesia Plan: General   Post-op Pain Management:    Induction: Intravenous  PONV Risk Score and Plan: 4 or greater and Ondansetron, Dexamethasone and Treatment may vary due to age or medical condition  Airway Management Planned: LMA  Additional Equipment:   Intra-op Plan:   Post-operative Plan: Extubation in OR  Informed Consent: I have reviewed the patients History and Physical, chart, labs and discussed the procedure including the risks, benefits and alternatives for the proposed anesthesia with the patient or authorized representative who has indicated his/her understanding and acceptance.     Dental advisory given  Plan Discussed with: CRNA  Anesthesia Plan Comments:         Anesthesia Quick  Evaluation

## 2020-05-15 NOTE — Discharge Instructions (Addendum)
Transurethral Resection of Bladder Tumor (TURBT) or Bladder Biopsy ° ° °Definition: ° Transurethral Resection of the Bladder Tumor is a surgical procedure used to diagnose and remove tumors within the bladder. TURBT is the most common treatment for early stage bladder cancer. ° °General instructions: °   ° Your recent bladder surgery requires very little post hospital care but some definite precautions. ° °Despite the fact that no skin incisions were used, the area around the bladder incisions are raw and covered with scabs to promote healing and prevent bleeding. Certain precautions are needed to insure that the scabs are not disturbed over the next 2-4 weeks while the healing proceeds. ° °Because the raw surface inside your bladder and the irritating effects of urine you may expect frequency of urination and/or urgency (a stronger desire to urinate) and perhaps even getting up at night more often. This will usually resolve or improve slowly over the healing period. You may see some blood in your urine over the first 6 weeks. Do not be alarmed, even if the urine was clear for a while. Get off your feet and drink lots of fluids until clearing occurs. If you start to pass clots or don't improve call us. ° °Diet: ° °You may return to your normal diet immediately. Because of the raw surface of your bladder, alcohol, spicy foods, foods high in acid and drinks with caffeine may cause irritation or frequency and should be used in moderation. To keep your urine flowing freely and avoid constipation, drink plenty of fluids during the day (8-10 glasses). Tip: Avoid cranberry juice because it is very acidic. ° °Activity: ° °Your physical activity doesn't need to be restricted. However, if you are very active, you may see some blood in the urine. We suggest that you reduce your activity under the circumstances until the bleeding has stopped. ° °Bowels: ° °It is important to keep your bowels regular during the postoperative  period. Straining with bowel movements can cause bleeding. A bowel movement every other day is reasonable. Use a mild laxative if needed, such as milk of magnesia 2-3 tablespoons, or 2 Dulcolax tablets. Call if you continue to have problems. If you had been taking narcotics for pain, before, during or after your surgery, you may be constipated. Take a laxative if necessary. ° ° ° °Medication: ° °You should resume your pre-surgery medications unless told not to. In addition you may be given an antibiotic to prevent or treat infection. Antibiotics are not always necessary. All medication should be taken as prescribed until the bottles are finished unless you are having an unusual reaction to one of the drugs. ° ° ° ° ° °Post Anesthesia Home Care Instructions ° °Activity: °Get plenty of rest for the remainder of the day. A responsible individual must stay with you for 24 hours following the procedure.  °For the next 24 hours, DO NOT: °-Drive a car °-Operate machinery °-Drink alcoholic beverages °-Take any medication unless instructed by your physician °-Make any legal decisions or sign important papers. ° °Meals: °Start with liquid foods such as gelatin or soup. Progress to regular foods as tolerated. Avoid greasy, spicy, heavy foods. If nausea and/or vomiting occur, drink only clear liquids until the nausea and/or vomiting subsides. Call your physician if vomiting continues. ° °Special Instructions/Symptoms: °Your throat may feel dry or sore from the anesthesia or the breathing tube placed in your throat during surgery. If this causes discomfort, gargle with warm salt water. The discomfort should disappear within 24   hours. ° °If you had a scopolamine patch placed behind your ear for the management of post- operative nausea and/or vomiting: ° °1. The medication in the patch is effective for 72 hours, after which it should be removed.  Wrap patch in a tissue and discard in the trash. Wash hands thoroughly with soap and  water. °2. You may remove the patch earlier than 72 hours if you experience unpleasant side effects which may include dry mouth, dizziness or visual disturbances. °3. Avoid touching the patch. Wash your hands with soap and water after contact with the patch. °   ° ° ° °

## 2020-05-15 NOTE — Anesthesia Postprocedure Evaluation (Signed)
Anesthesia Post Note  Patient: Marie Harrison  Procedure(s) Performed: TRANSURETHRAL RESECTION OF BLADDER TUMOR WITH GEMCITABINE (N/A Bladder)     Patient location during evaluation: PACU Anesthesia Type: General Level of consciousness: awake and alert Pain management: pain level controlled Vital Signs Assessment: post-procedure vital signs reviewed and stable Respiratory status: spontaneous breathing, nonlabored ventilation, respiratory function stable and patient connected to nasal cannula oxygen Cardiovascular status: blood pressure returned to baseline and stable Postop Assessment: no apparent nausea or vomiting Anesthetic complications: no   No complications documented.  Last Vitals:  Vitals:   05/15/20 1008 05/15/20 1015  BP:  (!) 95/56  Pulse: 84 88  Resp: 11 15  Temp:    SpO2:  100%    Last Pain:  Vitals:   05/15/20 0945  TempSrc:   PainSc: 8                  Nakina Spatz L Yaphet Smethurst

## 2020-05-15 NOTE — H&P (Signed)
H&P  Chief Complaint: Bladder tumor  History of Present Illness: 79 year old female evaluated for hematuria was found to have bladder tumors. She presents for transurethral resection with instillation of gemcitabine  Past Medical History:  Diagnosis Date  . Allergic rhinitis   . Anxiety   . Arthritis   . Asthma    mild no inhaler use  . Bladder tumor   . Depression   . Eczema    none currently  . Fibromyalgia   . GERD (gastroesophageal reflux disease)   . History of basal cell carcinoma excision    x6  face/ head  . History of CVA (cerebrovascular accident)    Remote CVA per ct 2009   no residual deficits  . History of recurrent UTI (urinary tract infection)   . Hyperlipidemia   . Hypertension   . IBS (irritable bowel syndrome)   . Osteoporosis   . Patellar clunk syndrome of right knee   . Pollen allergies   . PONV (postoperative nausea and vomiting)   . SUI (stress urinary incontinence, female)   . Wears partial dentures    upper and lower   Past Surgical History:  Procedure Laterality Date  . CATARACT EXTRACTION W/ INTRAOCULAR LENS  IMPLANT, BILATERAL  2001  &  1999  . DILATATION & CURRETTAGE/HYSTEROSCOPY WITH RESECTOCOPE N/A 11/28/2014   Procedure: DILATATION & CURETTAGE/HYSTEROSCOPY WITH RESECTOCOPE;  Surgeon: Servando Salina, MD;  Location: [facility];  Service: Gynecology;  Laterality: N/A;  . FOOT SURGERY Right 1955   removal of bone spur  . JOINT REPLACEMENT    . KNEE ARTHROSCOPY Right 12/07/2012   Procedure: RIGHT KNEE ARTHROSCOPY WITH DEBRIDEMENT AND PARTIAL MEDIAL MENISECTOMY  AND CHONDROPLASTY;  Surgeon: Gearlean Alf, MD;  Location: [facility];  Service: Orthopedics;  Laterality: Right;  . KNEE ARTHROSCOPY Left 2001  . KNEE ARTHROSCOPY Right 10/02/2015   Procedure: ARTHROSCOPY RIGHT KNEE WITH SYNOVECTOMY;  Surgeon: Gaynelle Arabian, MD;  Location: [facility];  Service: Orthopedics;  Laterality: Right;  . TONSILLECTOMY  child  . TOTAL KNEE  ARTHROPLASTY Right 04/30/2014   Procedure: RIGHT TOTAL KNEE ARTHROPLASTY;  Surgeon: Gearlean Alf, MD;  Location: [facility];  Service: Orthopedics;  Laterality: Right;  . TOTAL KNEE ARTHROPLASTY Left 03/02/2016   Procedure: TOTAL LEFT KNEE ARTHROPLASTY;  Surgeon: Gaynelle Arabian, MD;  Location: [facility];  Service: Orthopedics;  Laterality: Left;    Home Medications:  Medications Prior to Admission  Medication Sig Dispense Refill Last Dose  . acetaminophen (TYLENOL) 500 MG tablet Take 1,000 mg by mouth every 6 (six) hours as needed.   05/14/2020 at Unknown time  . aspirin EC 81 MG tablet Take 81 mg by mouth daily. Swallow whole.   Past Week at Unknown time  . celecoxib (CELEBREX) 100 MG capsule Take 100 mg by mouth 2 (two) times daily as needed.   05/12/2020  . famotidine (PEPCID) 20 MG tablet Take 20 mg by mouth 2 (two) times daily.   05/15/2020 at [place]  . fexofenadine (ALLEGRA ALLERGY) 180 MG tablet Take 180 mg by mouth daily.   05/15/2020 at [place]  . FLUoxetine (PROZAC) 40 MG capsule Take 40 mg by mouth daily.   05/14/2020 at Unknown time  . fluticasone (FLONASE) 50 MCG/ACT nasal spray Place 1 spray into both nostrils daily as needed.    Past Week at Unknown time  . hydrochlorothiazide (HYDRODIURIL) 25 MG tablet Take 1 tablet (25 mg total) by mouth every morning. 90 tablet 3 05/14/2020 at Unknown time  .  lisinopril (ZESTRIL) 40 MG tablet Take 40 mg by mouth daily.   05/14/2020 at Unknown time  . LORazepam (ATIVAN) 0.5 MG tablet TAKE 0.5 TABLET BY MOUTH AT BEDTIME AS NEEDED FOR SLEEP (Patient taking differently: TAKE 0.25-0.5mg  TABLET BY MOUTH AT BEDTIME AS NEEDED FOR SLEEP) 30 tablet 3 05/14/2020 at Unknown time  . lovastatin (MEVACOR) 40 MG tablet Take 1 tablet (40 mg total) by mouth daily with supper. 90 tablet 3 05/14/2020 at Unknown time  . nitrofurantoin (MACRODANTIN) 100 MG capsule Take 100 mg by mouth at bedtime.   05/14/2020 at Unknown time  . potassium chloride (KLOR-CON) 10 MEQ tablet Take 10 mEq by mouth 2  (two) times daily.   05/14/2020 at Unknown time  . UNABLE TO FIND viatmin b12 sl 2500 mg daily   Past Week at Unknown time  . triamcinolone cream (KENALOG) 0.1 % Apply 1 application topically daily as needed.    More than a month at Unknown time   Allergies:  Allergies  Allergen Reactions  . Ciprofloxacin Itching and Nausea And Vomiting    AND Light headed  . Codeine Itching and Nausea And Vomiting    Can take in small l doses with doof  . Sulfa Antibiotics Itching and Nausea And Vomiting    AND LIGHT HEADED    Family History  Problem Relation Age of Onset  . Cancer Mother        pancreatic   . Diabetes Father   . Heart disease Father   . Stroke Father    Social History:  reports that she quit smoking about 41 years ago. Her smoking use included cigarettes. She has a 6.00 pack-year smoking history. She has never used smokeless tobacco. She reports current alcohol use. She reports that she does not use drugs.  ROS: A complete review of systems was performed.  All systems are negative except for pertinent findings as noted. ROS   Physical Exam:  Vital signs in last 24 hours: Temp:  [98.2 F (36.8 C)] 98.2 F (36.8 C) (09/08 0702) Pulse Rate:  [82] 82 (09/08 0702) Resp:  [15] 15 (09/08 0702) BP: (154)/(77) 154/77 (09/08 0702) SpO2:  [96 %] 96 % (09/08 0702) Weight:  [77.4 kg] 77.4 kg (09/08 0702) General:  Alert and oriented, No acute distress HEENT: Normocephalic, atraumatic Neck: No JVD or lymphadenopathy Cardiovascular: Regular rate and rhythm Lungs: Regular rate and effort Abdomen: Soft, nontender, nondistended, no abdominal masses Back: No CVA tenderness Extremities: No edema Neurologic: Grossly intact  Laboratory Data:  Results for orders placed or performed during the hospital encounter of 05/15/20 (from the past 24 hour(s))  I-STAT, chem 8     Status: Abnormal   Collection Time: 05/15/20  7:17 AM  Result Value Ref Range   Sodium 135 135 - 145 mmol/L    Potassium 3.6 3.5 - 5.1 mmol/L   Chloride 95 (L) 98 - 111 mmol/L   BUN 10 8 - 23 mg/dL   Creatinine, Ser 0.50 0.44 - 1.00 mg/dL   Glucose, Bld 106 (H) 70 - 99 mg/dL   Calcium, Ion 1.25 1.15 - 1.40 mmol/L   TCO2 27 22 - 32 mmol/L   Hemoglobin 12.9 12.0 - 15.0 g/dL   HCT 38.0 36 - 46 %   Recent Results (from the past 240 hour(s))  SARS CORONAVIRUS 2 (TAT 6-24 HRS) Nasopharyngeal Nasopharyngeal Swab     Status: None   Collection Time: 05/11/20 11:02 AM   Specimen: Nasopharyngeal Swab  Result Value Ref  Range Status   SARS Coronavirus 2 NEGATIVE NEGATIVE Final    Comment: (NOTE) SARS-CoV-2 target nucleic acids are NOT DETECTED.  The SARS-CoV-2 RNA is generally detectable in upper and lower respiratory specimens during the acute phase of infection. Negative results do not preclude SARS-CoV-2 infection, do not rule out co-infections with other pathogens, and should not be used as the sole basis for treatment or other patient management decisions. Negative results must be combined with clinical observations, patient history, and epidemiological information. The expected result is Negative.  Fact Sheet for Patients: SugarRoll.be  Fact Sheet for Healthcare Providers: https://www.woods-mathews.com/  This test is not yet approved or cleared by the [place] FDA and  has been authorized for detection and/or diagnosis of SARS-CoV-2 by FDA under an Emergency Use Authorization (EUA). This EUA will remain  in effect (meaning this test can be used) for the duration of the COVID-19 declaration under Se ction 564(b)(1) of the Act, 21 U.S.C. section 360bbb-3(b)(1), unless the authorization is terminated or revoked sooner.  Performed at [facility], [place] [address], [place], [place] [address]    Creatinine: Recent Labs    05/15/20 3606  CREATININE 0.50    Impression/Assessment:  Bladder tumor  Plan:  Proceed with TURBT with  instillation of gemcitabine. Risk and benefits discussed as outlined in previous notes.  Marton Redwood, III 05/15/2020, 8:30 AM

## 2020-05-15 NOTE — Op Note (Signed)
Operative Note  Preoperative diagnosis:  1. Bladder tumor  Postoperative diagnosis: 1. Bladder tumor--medium  Procedure(s): 1. Transurethral resection of bladder tumor--medium 2. Intravesical instillation of gemcitabine  Surgeon: Link Snuffer, MD  Assistants: None  Anesthesia: General  Complications: None immediate  EBL: Minimal  Specimens: 1. Bladder tumor  Drains/Catheters: 1. 18 French Foley catheter  Intraoperative findings: 1. Normal urethra 2. Bilateral ureteral orifices were normal. Along the posterior bladder wall there was a 1 cm tumor. Just lateral to this on the left there was an approximately 2 cm bladder tumor. There was also another 1 cm papillary bladder tumor. Surrounding this was an area of erythema. All of this was completely resected and fulgurated.  Indication: 79 year old female with a history of hematuria that was found to have bladder tumors presents for the previously mentioned operation.  Description of procedure:  The patient was identified and consent was obtained.  The patient was taken to the operating room and placed in the supine position.  The patient was placed under general anesthesia.  Perioperative antibiotics were administered.  The patient was placed in dorsal lithotomy.  Patient was prepped and draped in a standard sterile fashion and a timeout was performed.  A 26 French resectoscope with the blunt obturator in place was inserted into the urethra and into the bladder. This was exchanged for the working element. On bipolar settings, I proceeded to resect the bladder tumors of interest. I then fulgurated the bladder tumor beds as well as the surrounding areas of erythema which I did also resected. I collected all of the bladder tumor for specimen and sent this off for permanent pathology. I reinspected the bladder mucosa and there was no active bleeding noted. No other bladder tumors noted. I therefore withdrew the scope and placed an 52 French  Foley catheter. Patient tolerated the procedure well and was stable postoperative.  In the PACU, intravesical gemcitabine was instilled into the bladder which remained for proxy 1 hour prior to proper disposal.  Plan: Follow-up in 1 week for pathology review

## 2020-05-15 NOTE — Anesthesia Procedure Notes (Signed)
Procedure Name: Intubation Date/Time: 05/15/2020 8:40 AM Performed by: Eulas Post, Zakaree Mcclenahan W, CRNA Pre-anesthesia Checklist: Patient identified, Emergency Drugs available, Suction available and Patient being monitored Patient Re-evaluated:Patient Re-evaluated prior to induction Oxygen Delivery Method: Circle system utilized Preoxygenation: Pre-oxygenation with 100% oxygen Induction Type: IV induction Ventilation: Mask ventilation without difficulty Laryngoscope Size: Glidescope Grade View: Grade I Tube type: Oral Tube size: 7.0 mm Number of attempts: 1 Airway Equipment and Method: Stylet and Video-laryngoscopy Placement Confirmation: ETT inserted through vocal cords under direct vision,  positive ETCO2 and breath sounds checked- equal and bilateral Secured at: 21 cm Tube secured with: Tape Dental Injury: Teeth and Oropharynx as per pre-operative assessment  Comments: Used glide scope due to prior trauma to tumors located on hard palate per patient. With generous lub, gently placed glide scope to get view, lubricated ETT placed with great care into oral cavity and into view of oropharynx on glide scope screen. No trauma noted. =BBS,  Pos eTCO2

## 2020-05-15 NOTE — Transfer of Care (Signed)
Immediate Anesthesia Transfer of Care Note  Patient: Marie Harrison  Procedure(s) Performed: TRANSURETHRAL RESECTION OF BLADDER TUMOR WITH GEMCITABINE (N/A Bladder)  Patient Location: PACU  Anesthesia Type:General  Level of Consciousness: awake  Airway & Oxygen Therapy: Patient Spontanous Breathing and Patient connected to face mask oxygen  Post-op Assessment: Report given to RN and Post -op Vital signs reviewed and stable  Post vital signs: Reviewed and stable  Last Vitals:  Vitals Value Taken Time  BP    Temp    Pulse 72 05/15/20 0915  Resp 15 05/15/20 0917  SpO2 100 % 05/15/20 0915  Vitals shown include unvalidated device data.  Last Pain:  Vitals:   05/15/20 0702  TempSrc: Oral  PainSc: 0-No pain      Patients Stated Pain Goal: 5 (86/16/83 7290)  Complications: No complications documented.

## 2020-05-16 ENCOUNTER — Encounter (HOSPITAL_BASED_OUTPATIENT_CLINIC_OR_DEPARTMENT_OTHER): Payer: Self-pay | Admitting: Urology

## 2020-05-16 LAB — SURGICAL PATHOLOGY

## 2020-06-19 ENCOUNTER — Other Ambulatory Visit: Payer: Self-pay | Admitting: Urology

## 2021-08-27 ENCOUNTER — Other Ambulatory Visit: Payer: Self-pay

## 2021-08-27 ENCOUNTER — Emergency Department (HOSPITAL_BASED_OUTPATIENT_CLINIC_OR_DEPARTMENT_OTHER)
Admission: EM | Admit: 2021-08-27 | Discharge: 2021-08-27 | Disposition: A | Discharge status: Home / Self Care | Payer: Medicare Other | Attending: Emergency Medicine | Admitting: Emergency Medicine

## 2021-08-27 ENCOUNTER — Encounter (HOSPITAL_BASED_OUTPATIENT_CLINIC_OR_DEPARTMENT_OTHER): Payer: Self-pay | Admitting: Radiology

## 2021-08-27 ENCOUNTER — Emergency Department (HOSPITAL_BASED_OUTPATIENT_CLINIC_OR_DEPARTMENT_OTHER): Payer: Medicare Other

## 2021-08-27 DIAGNOSIS — Z96651 Presence of right artificial knee joint: Secondary | ICD-10-CM | POA: Diagnosis not present

## 2021-08-27 DIAGNOSIS — M25551 Pain in right hip: Secondary | ICD-10-CM | POA: Insufficient documentation

## 2021-08-27 DIAGNOSIS — Z87891 Personal history of nicotine dependence: Secondary | ICD-10-CM | POA: Diagnosis not present

## 2021-08-27 DIAGNOSIS — Z96652 Presence of left artificial knee joint: Secondary | ICD-10-CM | POA: Insufficient documentation

## 2021-08-27 DIAGNOSIS — I10 Essential (primary) hypertension: Secondary | ICD-10-CM | POA: Insufficient documentation

## 2021-08-27 DIAGNOSIS — W19XXXA Unspecified fall, initial encounter: Secondary | ICD-10-CM

## 2021-08-27 DIAGNOSIS — W01198A Fall on same level from slipping, tripping and stumbling with subsequent striking against other object, initial encounter: Secondary | ICD-10-CM | POA: Insufficient documentation

## 2021-08-27 DIAGNOSIS — J45909 Unspecified asthma, uncomplicated: Secondary | ICD-10-CM | POA: Diagnosis not present

## 2021-08-27 DIAGNOSIS — Z7982 Long term (current) use of aspirin: Secondary | ICD-10-CM | POA: Insufficient documentation

## 2021-08-27 DIAGNOSIS — Z79899 Other long term (current) drug therapy: Secondary | ICD-10-CM | POA: Insufficient documentation

## 2021-08-27 DIAGNOSIS — M25511 Pain in right shoulder: Secondary | ICD-10-CM | POA: Diagnosis not present

## 2021-08-27 DIAGNOSIS — Z85828 Personal history of other malignant neoplasm of skin: Secondary | ICD-10-CM | POA: Insufficient documentation

## 2021-08-27 MED ORDER — TRAMADOL HCL 50 MG PO TABS: 50.0000 mg | ORAL_TABLET | Freq: Four times a day (QID) | ORAL | 0 refills | Status: DC | PRN

## 2021-08-27 MED ORDER — FENTANYL CITRATE PF 50 MCG/ML IJ SOSY
50.0000 ug | PREFILLED_SYRINGE | Freq: Once | INTRAMUSCULAR | Status: AC
  Administered 2021-08-27: 16:00:00 50 ug via INTRAMUSCULAR
  Filled 2021-08-27: qty 1

## 2021-08-27 NOTE — ED Triage Notes (Signed)
Right hip pain after tripping over her christmas tree. Pt landed on her butt but stats more on her right side. Pt was able to get up and put ice on that hip but it continued to hurt. No shortening or rotation.

## 2021-08-27 NOTE — Discharge Instructions (Signed)
Please pick up pain medication and take as needed  While at home please rest, ice, and elevate your leg to help reduce pain/inflammation  Follow up with your PCP for further evaluation  Return to the ED for any new/worsening symptoms

## 2021-08-27 NOTE — ED Notes (Signed)
Pt ambulatory to restroom without difficulty. Able to sit on commode without assistance

## 2021-08-27 NOTE — ED Provider Notes (Signed)
[facility] EMERGENCY DEPARTMENT Provider Note   CSN: [id number] Arrival date & time: 08/27/21  1540     History Chief Complaint  Patient presents with   Hip Pain    Marie Harrison is a 80 y.o. female who presents to the ED today via EMS with complaint of gradual Harrison, Marie Harrison, Marie Harrison, Marie Harrison when a part of her shoelace got stuck on it.  She states that she tried to kick it off however this caused her to fall backwards.  She was able to brace her fall somewhat on the edge of a lazy boy chair however fell back onto her bottom.  She states that she was able to get herself up and sit down on the couch however when she attempted to ambulate she noticed pain in her Marie hip.  She also complains of pain mildly to her Marie shoulder.  She has not taking anything for pain prior to arrival.  She denies any head injury or loss of consciousness and family can corroborate this at bedside.   The history is provided by the patient and medical records.      Past Medical History:  Diagnosis Date   Allergic rhinitis    Anxiety    Arthritis    Asthma    mild no inhaler use   Bladder tumor    Depression    Eczema    none currently   Fibromyalgia    GERD (gastroesophageal reflux disease)    History of basal cell carcinoma excision    x6  face/ head   History of CVA (cerebrovascular accident)    Remote CVA per ct 2009   no residual deficits   History of recurrent UTI (urinary tract infection)    Hyperlipidemia    Hypertension    IBS (irritable bowel syndrome)    Osteoporosis    Patellar clunk syndrome of Marie knee    Pollen allergies    PONV (postoperative nausea and vomiting)    SUI (stress urinary incontinence, female)    Wears partial dentures    upper and lower    Patient Active Problem List   Diagnosis Date Noted   Pain in  joint, ankle and foot 11/26/2015   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   Hyponatremia 05/01/2014   Hypokalemia 05/01/2014   OA (osteoarthritis) of knee 04/30/2014   Dyshidrotic eczema 01/23/2014   Acute medial meniscal tear 12/06/2012   Allergic rhinitis due to pollen 02/11/2012   Osteoarthritis 02/11/2012   Viral URI 11/02/2011   Asthma, mild 11/02/2011   LEUKOPLAKIA, ORAL MUCOSA 03/18/2010   CONTACT DERMATITIS&OTHER ECZEMA DUE DETERGENTS 10/01/2009   Hyperlipidemia 08/06/2009   Depression 08/06/2009   Essential hypertension 08/06/2009   GERD 08/06/2009   URINARY INCONTINENCE 08/06/2009   CEREBROVASCULAR ACCIDENT, HX OF 08/06/2009    Past Surgical History:  Procedure Laterality Date   CATARACT EXTRACTION W/ INTRAOCULAR LENS  IMPLANT, BILATERAL  2001  &  1999   DILATATION & CURRETTAGE/HYSTEROSCOPY WITH RESECTOCOPE N/A 11/28/2014   Procedure: DILATATION & CURETTAGE/HYSTEROSCOPY WITH RESECTOCOPE;  Surgeon: Servando Salina, MD;  Location: [facility];  Service: Gynecology;  Laterality: N/A;   FOOT SURGERY Marie 1955   removal of bone spur   JOINT REPLACEMENT     KNEE ARTHROSCOPY Marie 12/07/2012   Procedure: Marie KNEE ARTHROSCOPY WITH DEBRIDEMENT AND PARTIAL MEDIAL MENISECTOMY  AND  CHONDROPLASTY;  Surgeon: Gearlean Alf, MD;  Location: [facility];  Service: Orthopedics;  Laterality: Marie;   KNEE ARTHROSCOPY Left 2001   KNEE ARTHROSCOPY Marie 10/02/2015   Procedure: ARTHROSCOPY Marie KNEE WITH SYNOVECTOMY;  Surgeon: Gaynelle Arabian, MD;  Location: [facility];  Service: Orthopedics;  Laterality: Marie;   TONSILLECTOMY  child   TOTAL KNEE ARTHROPLASTY Marie 04/30/2014   Procedure: Marie TOTAL KNEE ARTHROPLASTY;  Surgeon: Gearlean Alf, MD;  Location: [facility];  Service: Orthopedics;  Laterality: Marie;   TOTAL KNEE ARTHROPLASTY Left 03/02/2016   Procedure: TOTAL LEFT KNEE ARTHROPLASTY;  Surgeon: Gaynelle Arabian, MD;  Location: [facility];  Service:  Orthopedics;  Laterality: Left;   TRANSURETHRAL RESECTION OF BLADDER TUMOR WITH MITOMYCIN-C N/A 05/15/2020   Procedure: TRANSURETHRAL RESECTION OF BLADDER TUMOR WITH GEMCITABINE;  Surgeon: Lucas Mallow, MD;  Location[facility];  Service: Urology;  Laterality: N/A;     OB History   No obstetric history on file.     Family History  Problem Relation Age of Harrison   Cancer Mother        pancreatic    Diabetes Father    Heart disease Father    Stroke Father     Social History   Tobacco Use   Smoking status: Former    Packs/day: 1.00    Years: 6.00    Pack years: 6.00    Types: Cigarettes    Quit date: 09/23/1978    Years since quitting: 42.9   Smokeless tobacco: Never  Vaping Use   Vaping Use: Never used  Substance Use Topics   Alcohol use: Yes    Comment: OCCASIONAL   Drug use: No    Home Medications Prior to Admission medications   Medication Sig Start Date End Date Taking? Authorizing Provider  traMADol (ULTRAM) 50 MG tablet Take 1 tablet (50 mg total) by mouth every 6 (six) hours as needed. 08/27/21  Yes Yakira Duquette, PA-C  acetaminophen (TYLENOL) 500 MG tablet Take 1,000 mg by mouth every 6 (six) hours as needed.    [provider]  aspirin EC 81 MG tablet Take 81 mg by mouth daily. Swallow whole.    [provider]  celecoxib (CELEBREX) 100 MG capsule Take 100 mg by mouth 2 (two) times daily as needed.    [provider]  famotidine (PEPCID) 20 MG tablet Take 20 mg by mouth 2 (two) times daily.    [provider]  fexofenadine (ALLEGRA ALLERGY) 180 MG tablet Take 180 mg by mouth daily.    [provider]  FLUoxetine (PROZAC) 40 MG capsule Take 40 mg by mouth daily.    [provider]  fluticasone (FLONASE) 50 MCG/ACT nasal spray Place 1 spray into both nostrils daily as needed.     [provider]  hydrochlorothiazide (HYDRODIURIL) 25 MG tablet Take 1 tablet (25 mg total) by mouth  every morning. 04/17/14   Dorena Cookey, MD  HYDROcodone-acetaminophen (NORCO) 5-325 MG tablet Take 1 tablet by mouth every 4 (four) hours as needed for moderate pain. 05/15/20   Marton Redwood III, MD  lisinopril (ZESTRIL) 40 MG tablet Take 40 mg by mouth daily.    [provider]  LORazepam (ATIVAN) 0.5 MG tablet TAKE 0.5 TABLET BY MOUTH AT BEDTIME AS NEEDED FOR SLEEP Patient taking differently: TAKE 0.25-0.5mg  TABLET BY MOUTH AT BEDTIME AS NEEDED FOR SLEEP 11/22/14   Dorena Cookey, MD  lovastatin (MEVACOR) 40 MG  tablet Take 1 tablet (40 mg total) by mouth daily with supper. 04/17/14   Dorena Cookey, MD  nitrofurantoin (MACRODANTIN) 100 MG capsule Take 100 mg by mouth at bedtime.    [provider]  potassium chloride (KLOR-CON) 10 MEQ tablet Take 10 mEq by mouth 2 (two) times daily.    [provider]  triamcinolone cream (KENALOG) 0.1 % Apply 1 application topically daily as needed.     [provider]  UNABLE TO FIND viatmin b12 sl 2500 mg daily    [provider]    Allergies    Ciprofloxacin, Codeine, and Sulfa antibiotics  Review of Systems   Review of Systems  Musculoskeletal:  Positive for arthralgias. Negative for joint swelling.  Skin:  Negative for wound.  Neurological:  Negative for syncope and headaches.  All other systems reviewed and are negative.  Physical Exam Updated Vital Signs BP (!) 176/67    Pulse 66    Temp 97.8 F (36.6 C) (Oral)    Resp 18    Ht 5\' 1"  (1.549 m)    Wt 77.1 kg    SpO2 100%    BMI 32.12 kg/m   Physical Exam Vitals and nursing note reviewed.  Constitutional:      Appearance: She is not ill-appearing or diaphoretic.  HENT:     Head: Normocephalic and atraumatic.     Comments: No raccoon's sign or battle's sign Eyes:     Conjunctiva/sclera: Conjunctivae normal.  Cardiovascular:     Rate and Rhythm: Normal rate and regular rhythm.     Pulses: Normal pulses.  Pulmonary:     Effort: Pulmonary  effort is normal.     Breath sounds: Normal breath sounds. No wheezing, rhonchi or rales.  Abdominal:     Palpations: Abdomen is soft.     Tenderness: There is no abdominal tenderness.  Musculoskeletal:     Cervical back: Neck supple. No tenderness.     Comments: No C, T, or L midline spinal TTP. No associated paracervical/thoracic/lumbar musculature TTP. ROM intact to neck and back.   No external rotation or leg shortening of RLE. + TTP to Marie hip. ROM intact however. Strength and sensation intact. 2+ DP pulse. No TTP distally.   + Mild TTP to posterior aspect of R shoulder. ROM intact to shoulder. Strength and sensation intact. 2+ radial pulse.   Skin:    General: Skin is warm and dry.  Neurological:     Mental Status: She is alert and oriented to person, place, and time.    ED Results / Procedures / Treatments   Labs (all labs ordered are listed, but only abnormal results are displayed) Labs Reviewed - No data to display  EKG None  Radiology DG Shoulder Marie  Result Date: 08/27/2021 CLINICAL DATA:  Marie shoulder pain after fall. EXAM: Marie SHOULDER - 2+ VIEW COMPARISON:  None. FINDINGS: There is no evidence of fracture or dislocation. There is no evidence of arthropathy or other focal bone abnormality. Soft tissues are unremarkable. IMPRESSION: Negative. Electronically Signed   By: Titus Dubin M.D.   On: 08/27/2021 16:40   DG Hip Unilat With Pelvis 2-3 Views Marie  Result Date: 08/27/2021 CLINICAL DATA:  Hip pain status post fall EXAM: DG HIP (WITH OR WITHOUT PELVIS) 2-3V Marie COMPARISON:  None. FINDINGS: There is no evidence of hip fracture or dislocation. There is mild bilateral superolateral hip joint space narrowing. Degenerate disc disease of the lower lumbar spine. IMPRESSION: No evidence  of fracture or dislocation. Electronically Signed   By: Keane Police D.O.   On: 08/27/2021 16:40    Procedures Procedures   Medications Ordered in ED Medications   fentaNYL (SUBLIMAZE) injection 50 mcg (50 mcg Intramuscular Given 08/27/21 1629)    ED Course  I have reviewed the triage vital signs and the nursing notes.  Pertinent labs & imaging results that were available during my care of the patient were reviewed by me and considered in my medical decision making (see chart for details).    MDM Rules/Calculators/A&P                          80 year old female who presents to the ED today with complaint of Marie hip and Marie shoulder pain status post mechanical fall that occurred earlier today.  No head injury or loss of consciousness.  Has been unable to bear weight onto the hip since that time due to pain.  On arrival to the ED vitals are stable.  Patient appears to be no acute distress at this time.  She is noted to have tenderness palpation to the Marie hip and posterior aspect of Marie shoulder.  She does have good range of motion to both while laying in the stretcher.  She is neurovascularly intact.  We will plan for pain medication and x-rays of the hip and shoulder.  If no signs of fracture we will plan to ambulate patient prior to discharge.   Xrays negative at this time.  Patient has been able to ambulate with walker without any assistance.  Reports improvement in pain.  We will plan to discharge home at this time.  Will send home with short course of pain medication to take as needed.  Recommended to follow-up with PCP for further evaluation.  They are in agreement with plan.   This note was prepared using Dragon voice recognition software and may include unintentional dictation errors due to the inherent limitations of voice recognition software.      Final Clinical Impression(s) / ED Diagnoses Final diagnoses:  Marie hip pain  Fall, initial encounter    Rx / DC Orders ED Discharge Orders          Ordered    traMADol (ULTRAM) 50 MG tablet  Every 6 hours PRN        08/27/21 1709             Discharge Instructions       Please pick up pain medication and take as needed  While at home please rest, ice, and elevate your leg to help reduce pain/inflammation  Follow up with your PCP for further evaluation  Return to the ED for any new/worsening symptoms       Eustaquio Maize, PA-C 08/27/21 1709    Tegeler, Gwenyth Allegra, MD 08/27/21 [phone]

## 2021-11-26 ENCOUNTER — Other Ambulatory Visit: Payer: Self-pay | Admitting: Ophthalmology

## 2022-02-25 IMAGING — DX DG SHOULDER 2+V*R*
3 series · 3 of 3 positions shown · non-contrast
Comparison: None.

CLINICAL DATA: Right shoulder pain after fall.

EXAM:
RIGHT SHOULDER - 2+ VIEW

[shoulder grashey]
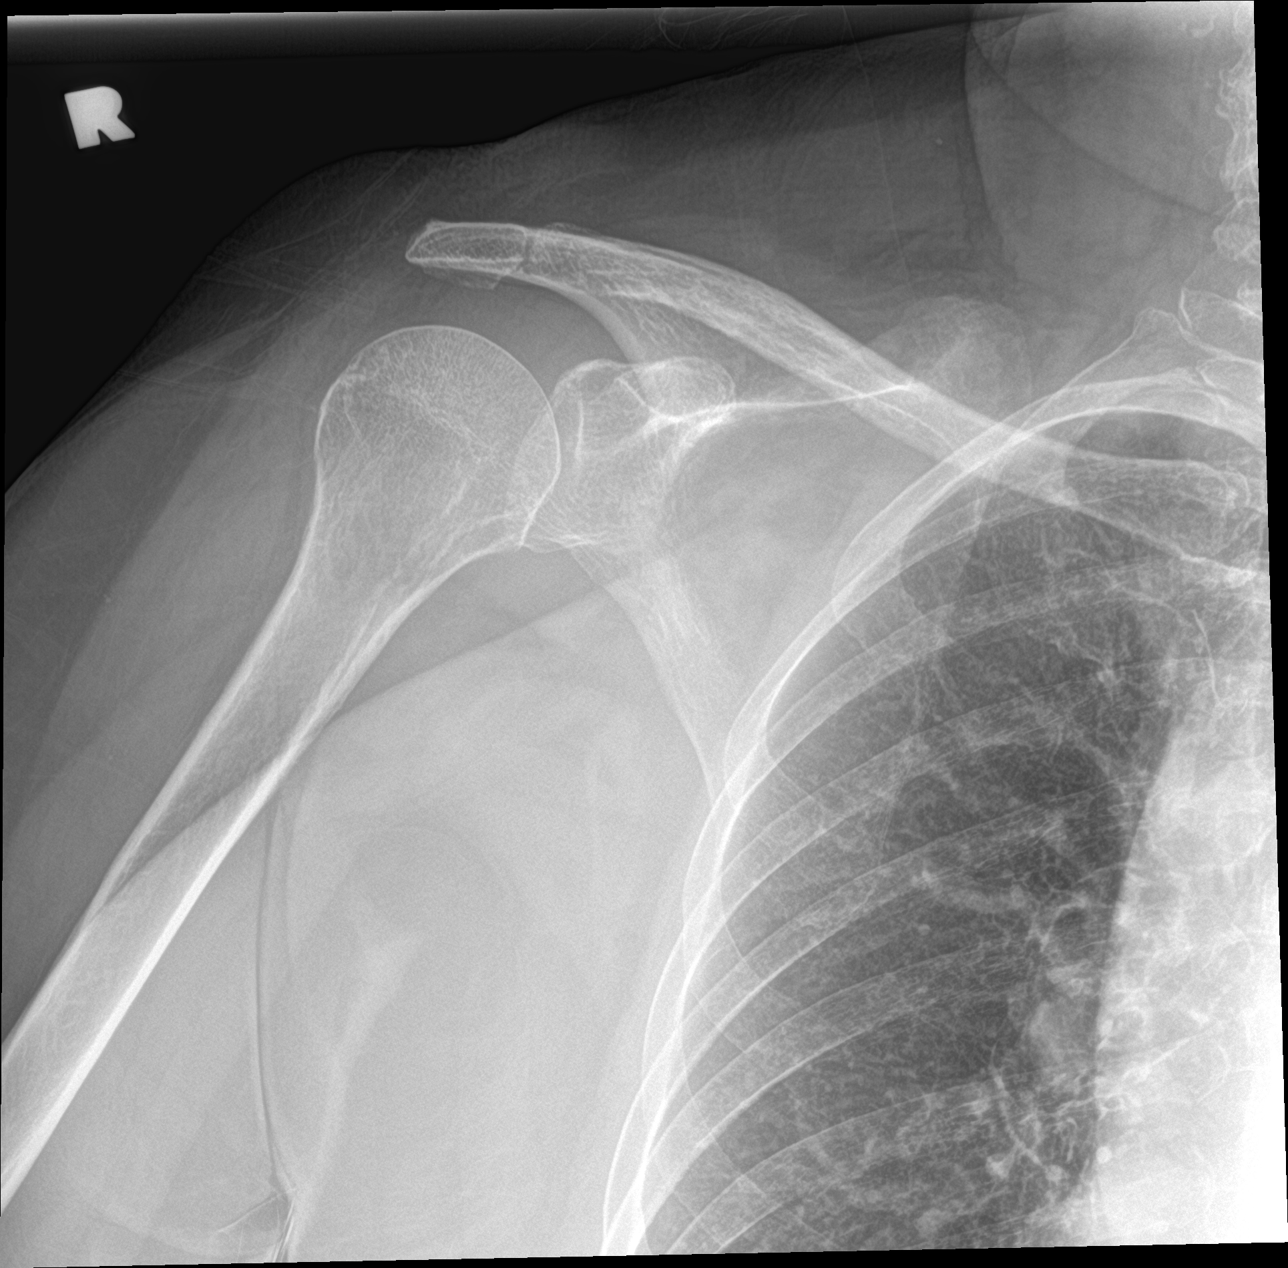

[shoulder y view]
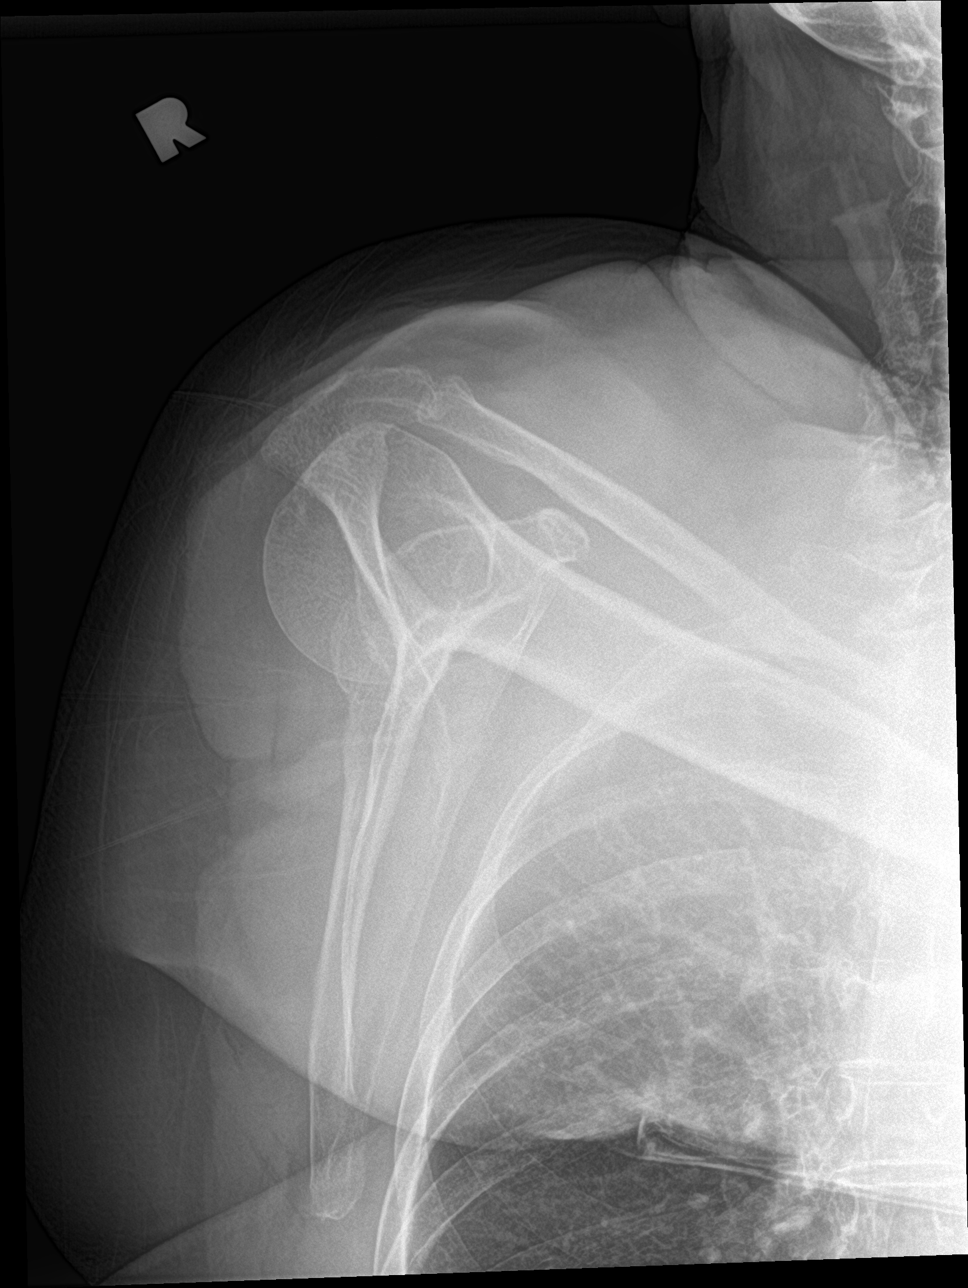

[shoulder ap neutral]
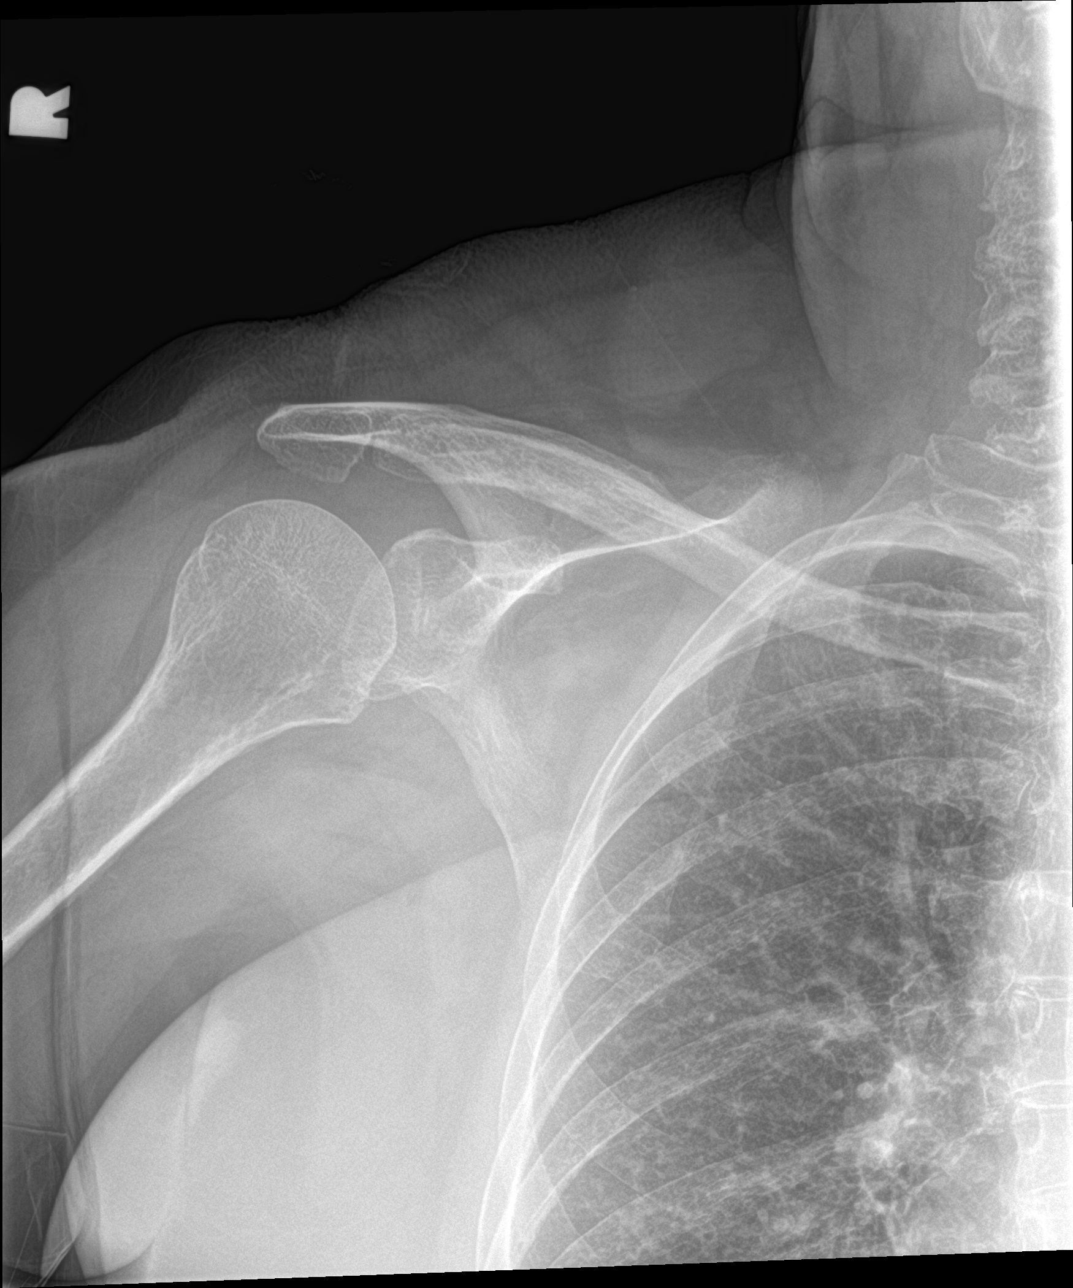

[3 of 3 positions shown; findings below may reference images not displayed]

FINDINGS: There is no evidence of fracture or dislocation. There is no
evidence of arthropathy or other focal bone abnormality. Soft
tissues are unremarkable.
IMPRESSION: Negative.

## 2022-11-11 ENCOUNTER — Encounter: Payer: Self-pay | Admitting: Gastroenterology

## 2022-11-25 ENCOUNTER — Encounter: Payer: Self-pay | Admitting: Gastroenterology

## 2022-11-25 ENCOUNTER — Ambulatory Visit (INDEPENDENT_AMBULATORY_CARE_PROVIDER_SITE_OTHER): Payer: Medicare PPO | Admitting: Gastroenterology

## 2022-11-25 VITALS — BP 124/68 | HR 78 | Ht 61.0 in | Wt 171.0 lb

## 2022-11-25 DIAGNOSIS — K582 Mixed irritable bowel syndrome: Secondary | ICD-10-CM | POA: Diagnosis not present

## 2022-11-25 DIAGNOSIS — K921 Melena: Secondary | ICD-10-CM | POA: Diagnosis not present

## 2022-11-25 DIAGNOSIS — Z800 Family history of malignant neoplasm of digestive organs: Secondary | ICD-10-CM | POA: Diagnosis not present

## 2022-11-25 DIAGNOSIS — R1012 Left upper quadrant pain: Secondary | ICD-10-CM | POA: Diagnosis not present

## 2022-11-25 MED ORDER — CLENPIQ 10-3.5-12 MG-GM -GM/175ML PO SOLN: 1.0000 | Freq: Once | ORAL | 0 refills | Status: AC

## 2022-11-25 NOTE — Patient Instructions (Signed)
You have been scheduled for a colonoscopy. Please follow written instructions given to you at your visit today.  Please pick up your prep supplies at the pharmacy within the next 1-3 days. If you use inhalers (even only as needed), please bring them with you on the day of your procedure.   You will be contacted by [facility] in the next 2 days to arrange a CT.  The number on your caller ID will be [phone], please answer when they call.  If you have not heard from them in 2 days please call [phone] to schedule.     You have been scheduled for a CT scan of the abdomen and pelvis at [facility] ([address], [place], [place] [address]).   You are scheduled on 12/11/22 at 5:00 pm. You should arrive at 2:45 pm prior to your appointment time to drink contrast. Please follow the written instructions below on the day of your exam:  WARNING: IF YOU ARE ALLERGIC TO IODINE/X-RAY DYE, PLEASE NOTIFY RADIOLOGY IMMEDIATELY AT [phone]! YOU WILL BE GIVEN A 13 HOUR PREMEDICATION PREP.  1) Do not eat or drink anything after 100 pm (4 hours prior to your test)  You may take any medications as prescribed with a small amount of water, if necessary. If you take any of the following medications: METFORMIN, GLUCOPHAGE, GLUCOVANCE, AVANDAMET, RIOMET, FORTAMET, [place] MET, JANUMET, GLUMETZA or METAGLIP, you MAY be asked to HOLD this medication 48 hours AFTER the exam.  The purpose of you drinking the oral contrast is to aid in the visualization of your intestinal tract. The contrast solution may cause some diarrhea. Depending on your individual set of symptoms, you may also receive an intravenous injection of x-ray contrast/dye. Plan on being at [facility] for 30 minutes or longer, depending on the type of exam you are having performed.  This test typically takes 30-45 minutes to complete.  If you have any questions regarding your exam or if you need to reschedule,  you may call the CT department at [phone] between the hours of 8:00 am and 5:00 pm, Monday-Friday.  ________________________________________________________________________  _______________________________________________________  If your blood pressure at your visit was 140/90 or greater, please contact your primary care physician to follow up on this.  _______________________________________________________  If you are age 84 or older, your body mass index should be between 23-30. Your Body mass index is 32.31 kg/m. If this is out of the aforementioned range listed, please consider follow up with your Primary Care Provider.  __________________________________________________________  The [facility] providers would like to encourage you to use [facility] to communicate with providers for non-urgent requests or questions.  Due to long hold times on the telephone, sending your provider a message by [facility] may be a faster and more efficient way to get a response.  Please allow 48 business hours for a response.  Please remember that this is for non-urgent requests.   Due to recent changes in healthcare laws, you may see the results of your imaging and laboratory studies on MyChart before your provider has had a chance to review them.  We understand that in some cases there may be results that are confusing or concerning to you. Not all laboratory results come back in the same time frame and the provider may be waiting for multiple results in order to interpret others.  Please give [place] 48 hours in order for your provider to thoroughly review all the results before contacting the  office for clarification of your results.     Thank you for choosing me and [facility].  Vito Cirigliano, D.O.

## 2022-11-25 NOTE — Progress Notes (Signed)
Chief Complaint: Blood in stools   Referring Provider:     Harlow Mares, NP    HPI:     Marie Harrison is a 82 y.o. female with a history of HTN, anxiety/depression, GERD, BCC, HLD, bladder cancer  s/p transurethral resection 05/2020, referred to the [facility] for evaluation of hematochezia.  She reports a long-standing history of scant BRB on tissue paper for many years. Worse with episodes of constipation/straining.  No dyschezia.  Can go 2 weeks w/o sxs. Sxs are unchanged for many years.  She will use MiraLAX and stool softeners on demand during episodes of constipation with relief.  Can have episodes of loose stools at times as well.  Previously followed with Dr. Earlean Shawl.   Last colonoscopy was approximately 2011 (no report available for review).  Previous to that, EGD (normal) and colonoscopy (normal) in 02/2005.  No recent abdominal imaging for review.  Reviewed labs from November and December 2023: - FOBT negative 08/13/2022 - Normal CMP - Normal CBC with H/H 12.4/37.1, MCV/RDW 96.5/18 - Normal iron panel and B12 in 01/2022  Long-standing hx of GERD, and had been on Pepcid for years. Reflux was no longer controlled with Pepcid, and was changed to omeprazole 20 mg, which she takes on-demand with good response. No dysphagia.   Occasional LUQ pain. Described as dull, ache. Typically 2-3 hours after eating.  Tends to improve when she gets up and walks around.  Mother with Pancreatic CA at age 57.    Past Medical History:  Diagnosis Date   Allergic rhinitis    Anxiety    Arthritis    Asthma    mild no inhaler use   Bladder tumor    Depression    Eczema    none currently   Fibromyalgia    GERD (gastroesophageal reflux disease)    History of basal cell carcinoma excision    x6  face/ head   History of CVA (cerebrovascular accident)    Remote CVA per ct 2009   no residual deficits   History of recurrent UTI (urinary tract infection)     Hyperlipidemia    Hypertension    IBS (irritable bowel syndrome)    Osteoporosis    Patellar clunk syndrome of right knee    Pollen allergies    PONV (postoperative nausea and vomiting)    SUI (stress urinary incontinence, female)    Wears partial dentures    upper and lower     Past Surgical History:  Procedure Laterality Date   CATARACT EXTRACTION W/ INTRAOCULAR LENS  IMPLANT, BILATERAL  2001  &  1999   DILATATION & CURRETTAGE/HYSTEROSCOPY WITH RESECTOCOPE N/A 11/28/2014   Procedure: DILATATION & CURETTAGE/HYSTEROSCOPY WITH RESECTOCOPE;  Surgeon: Servando Salina, MD;  Location: [facility];  Service: Gynecology;  Laterality: N/A;   FOOT SURGERY Right 1955   removal of bone spur   JOINT REPLACEMENT     KNEE ARTHROSCOPY Right 12/07/2012   Procedure: RIGHT KNEE ARTHROSCOPY WITH DEBRIDEMENT AND PARTIAL MEDIAL MENISECTOMY  AND CHONDROPLASTY;  Surgeon: Gearlean Alf, MD;  Location: [facility];  Service: Orthopedics;  Laterality: Right;   KNEE ARTHROSCOPY Left 2001   KNEE ARTHROSCOPY Right 10/02/2015   Procedure: ARTHROSCOPY RIGHT KNEE WITH SYNOVECTOMY;  Surgeon: Gaynelle Arabian, MD;  Location: [facility];  Service: Orthopedics;  Laterality: Right;   TONSILLECTOMY  child   TOTAL KNEE ARTHROPLASTY Right 04/30/2014   Procedure: RIGHT  TOTAL KNEE ARTHROPLASTY;  Surgeon: Gearlean Alf, MD;  Location: [facility];  Service: Orthopedics;  Laterality: Right;   TOTAL KNEE ARTHROPLASTY Left 03/02/2016   Procedure: TOTAL LEFT KNEE ARTHROPLASTY;  Surgeon: Gaynelle Arabian, MD;  Location: [facility];  Service: Orthopedics;  Laterality: Left;   TRANSURETHRAL RESECTION OF BLADDER TUMOR WITH MITOMYCIN-C N/A 05/15/2020   Procedure: TRANSURETHRAL RESECTION OF BLADDER TUMOR WITH GEMCITABINE;  Surgeon: Lucas Mallow, MD;  Location[facility];  Service: Urology;  Laterality: N/A;   Family History  Problem Relation Age of Onset   Cancer Mother        pancreatic    Diabetes Father    Heart  disease Father    Stroke Father    Colon cancer Neg Hx    Esophageal cancer Neg Hx    Stomach cancer Neg Hx    Social History   Tobacco Use   Smoking status: Former    Packs/day: 1.00    Years: 6.00    Additional pack years: 0.00    Total pack years: 6.00    Types: Cigarettes    Quit date: 09/23/1978    Years since quitting: 44.2   Smokeless tobacco: Never  Vaping Use   Vaping Use: Never used  Substance Use Topics   Alcohol use: Yes    Comment: i beer every 2 weeks   Drug use: No   Current Outpatient Medications  Medication Sig Dispense Refill   amLODipine (NORVASC) 5 MG tablet Take 5 mg by mouth daily.     aspirin EC 81 MG tablet Take 81 mg by mouth daily. Swallow whole.     celecoxib (CELEBREX) 100 MG capsule Take 100 mg by mouth 2 (two) times daily as needed.     fexofenadine (ALLEGRA ALLERGY) 180 MG tablet Take 180 mg by mouth daily.     FLUoxetine (PROZAC) 40 MG capsule Take 40 mg by mouth daily.     hydrochlorothiazide (HYDRODIURIL) 25 MG tablet Take 1 tablet (25 mg total) by mouth every morning. 90 tablet 3   lisinopril (ZESTRIL) 40 MG tablet Take 40 mg by mouth daily.     lovastatin (MEVACOR) 40 MG tablet Take 1 tablet (40 mg total) by mouth daily with supper. 90 tablet 3   potassium chloride (KLOR-CON) 10 MEQ tablet Take 10 mEq by mouth 2 (two) times daily.     triamcinolone cream (KENALOG) 0.1 % Apply 1 application topically daily as needed.      UNABLE TO FIND viatmin b12 sl 2500 mg daily     acetaminophen (TYLENOL) 500 MG tablet Take 1,000 mg by mouth every 6 (six) hours as needed. (Patient not taking: Reported on 11/25/2022)     famotidine (PEPCID) 20 MG tablet Take 20 mg by mouth 2 (two) times daily. (Patient not taking: Reported on 11/25/2022)     fluticasone (FLONASE) 50 MCG/ACT nasal spray Place 1 spray into both nostrils daily as needed.  (Patient not taking: Reported on 11/25/2022)     HYDROcodone-acetaminophen (NORCO) 5-325 MG tablet Take 1 tablet by mouth  every 4 (four) hours as needed for moderate pain. (Patient not taking: Reported on 11/25/2022) 6 tablet 0   LORazepam (ATIVAN) 0.5 MG tablet TAKE 0.5 TABLET BY MOUTH AT BEDTIME AS NEEDED FOR SLEEP (Patient not taking: Reported on 11/25/2022) 30 tablet 3   nitrofurantoin (MACRODANTIN) 100 MG capsule Take 100 mg by mouth at bedtime. (Patient not taking: Reported on 11/25/2022)     traMADol (ULTRAM) 50  MG tablet Take 1 tablet (50 mg total) by mouth every 6 (six) hours as needed. (Patient not taking: Reported on 11/25/2022) 15 tablet 0   No current facility-administered medications for this visit.   Allergies  Allergen Reactions   Ciprofloxacin Itching and Nausea And Vomiting    AND Light headed   Codeine Itching and Nausea And Vomiting    Can take in small l doses with doof   Sulfa Antibiotics Itching and Nausea And Vomiting    AND LIGHT HEADED     Review of Systems: All systems reviewed and negative except where noted in HPI.     Physical Exam:    Wt Readings from Last 3 Encounters:  11/25/22 171 lb (77.6 kg)  08/27/21 170 lb (77.1 kg)  05/15/20 170 lb 9.6 oz (77.4 kg)    BP 124/68   Pulse 78   Ht 5\' 1"  (1.549 m)   Wt 171 lb (77.6 kg)   BMI 32.31 kg/m  Constitutional:  Pleasant, in no acute distress. Psychiatric: Normal mood and affect. Behavior is normal. Cardiovascular: Normal rate, regular rhythm. No edema Pulmonary/chest: Effort normal and breath sounds normal. No wheezing, rales or rhonchi. Abdominal: Soft, nondistended, nontender. Bowel sounds active throughout. There are no masses palpable. No hepatomegaly. Neurological: Alert and oriented to person place and time. Skin: Skin is warm and dry. No rashes noted.   ASSESSMENT AND PLAN;   1) Hematochezia Clinically, seems most consistent with benign anorectal etiology (i.e. hemorrhoids), but discussed full DDx with patient today.  She would like to proceed with colonoscopy to ensure no more proximal pathology. - Schedule  colonoscopy - If clinically significant hemorrhoids, can discuss role/utility of hemorrhoid banding  2) Alternating bowel habits - Evaluate for mucosal/luminal pathology at time of colonoscopy - Fiber supplement - Continue MiraLAX, stool softener, etc. on demand  3) Left-sided abdominal pain 4) Family history of Pancreatic Cancer Minimally bothersome, vague left-sided postprandial pain, typically 2-3 hours after a meal.  Based on duration of symptoms, age, family history of Pancreatic Cancer which causes her some degree of distress, will plan on cross-sectional imaging - CT A/P with contrast - Planning colonoscopy as above - Otherwise does not meet criteria for PC screening protocol based on family history   The indications, risks, and benefits of colonoscopy were explained to the patient in detail. Risks include but are not limited to bleeding, perforation, adverse reaction to medications, and cardiopulmonary compromise. Sequelae include but are not limited to the possibility of surgery, hospitalization, and mortality. The patient verbalized understanding and wished to proceed. All questions answered, referred to the scheduler and bowel prep ordered. Further recommendations pending results of the exam.    Lavena Bullion, DO, FACG  11/25/2022, 1:48 PM   Marie Loan Desiree Hane, MD

## 2022-12-11 ENCOUNTER — Ambulatory Visit (HOSPITAL_COMMUNITY)
Admission: RE | Admit: 2022-12-11 | Discharge: 2022-12-11 | Disposition: A | Discharge status: Home / Self Care | Payer: Medicare PPO | Source: Ambulatory Visit | Attending: Gastroenterology | Admitting: Gastroenterology

## 2022-12-11 DIAGNOSIS — R1012 Left upper quadrant pain: Secondary | ICD-10-CM | POA: Diagnosis present

## 2022-12-11 DIAGNOSIS — Z800 Family history of malignant neoplasm of digestive organs: Secondary | ICD-10-CM | POA: Diagnosis present

## 2022-12-11 DIAGNOSIS — K921 Melena: Secondary | ICD-10-CM | POA: Diagnosis present

## 2022-12-11 DIAGNOSIS — K582 Mixed irritable bowel syndrome: Secondary | ICD-10-CM | POA: Insufficient documentation

## 2022-12-11 MED ORDER — IOHEXOL 9 MG/ML PO SOLN
ORAL | Status: AC
  Filled 2022-12-11: qty 1000

## 2022-12-11 MED ORDER — IOHEXOL 300 MG/ML  SOLN: 100.0000 mL | Freq: Once | INTRAMUSCULAR | Status: DC | PRN

## 2022-12-11 MED ORDER — SODIUM CHLORIDE (PF) 0.9 % IJ SOLN
INTRAMUSCULAR | Status: AC
  Filled 2022-12-11: qty 50

## 2022-12-11 MED ORDER — IOHEXOL 9 MG/ML PO SOLN: 500.0000 mL | ORAL | Status: AC

## 2022-12-11 MED ORDER — IOHEXOL 300 MG/ML  SOLN
100.0000 mL | Freq: Once | INTRAMUSCULAR | Status: AC | PRN
  Administered 2022-12-11: 100 mL via INTRAVENOUS

## 2023-01-14 ENCOUNTER — Encounter: Payer: Medicare PPO | Admitting: Gastroenterology

## 2024-03-02 NOTE — Progress Notes (Signed)
  Electrophysiology Office Note:    Date:  03/03/2024   ID:  Marie Harrison, DOB 09-15-1940, MRN [id number]  [facility] Cardiologist:  None  [facility] Electrophysiologist:  OLE ONEIDA HOLTS, MD   Referring MD: Zackary Katheryn ORN, PA-C   Chief Complaint: Palpitations  History of Present Illness:     Marie Harrison is an 83 year old woman who I am seeing today for an evaluation of palpitations at the request of Katheryn Slocumb, PA-C.  The patient has a history of hypertension, hyperlipidemia, IBS, tremor.  Being told about palpitations and skipped beats.  She does not feel any abnormal heartbeat but has been told on multiple occasions that she is having skipped beats.  No syncope or presyncope.  She also reports chest pain that is centrally located and near the epigastrium.  She thinks that the symptoms occur 2 to 3 hours after a meal.  It does not sound like there is relationship to exertion.  She has never had a stress test that she is aware of although her health care has been fragmented as she moved multiple times for her husband's work.      Their past medical, social and family history was reviewed.   ROS:   Please see the history of present illness.    All other systems reviewed and are negative.  EKGs/Labs/Other Studies Reviewed:    The following studies were reviewed today:    EKG Interpretation Date/Time:  Friday March 03 2024 10:03:37 EDT Ventricular Rate:  66 PR Interval:  158 QRS Duration:  72 QT Interval:  424 QTC Calculation: 444 R Axis:   45  Text Interpretation: Normal sinus rhythm Low voltage QRS Confirmed by HOLTS OLE [id number]) on 03/03/2024 10:06:00 AM    Physical Exam:    VS:  BP 133/74   Pulse 66   Ht 5' 1 (1.549 m)   Wt 157 lb (71.2 kg)   SpO2 95%   BMI 29.66 kg/m     Wt Readings from Last 3 Encounters:  03/03/24 157 lb (71.2 kg)  11/25/22 171 lb (77.6 kg)  08/27/21 170 lb (77.1 kg)     GEN: no distress CARD: RRR, No MRG RESP: No  IWOB. CTAB.        ASSESSMENT AND PLAN:    1. Palpitations    #Chest pain Unclear cause.  Somewhat atypical sounding.  Will start with a CTA coronary to further evaluate  #Skipped beats She has been told multiple times about abnormal heartbeats.  I will order a 2-week ZIO monitor to further assess   #Hypertension At goal today.  Recommend checking blood pressures 1-2 times per week at home and recording the values.  Recommend bringing these recordings to the primary care physician.   Follow-up 6 weeks with APP to review the above.    Signed, OLE ONEIDA. HOLTS, MD, [facility], [facility] 03/03/2024 10:16 AM    Electrophysiology [facility]

## 2024-03-03 ENCOUNTER — Encounter: Payer: Self-pay | Admitting: Cardiology

## 2024-03-03 ENCOUNTER — Ambulatory Visit (INDEPENDENT_AMBULATORY_CARE_PROVIDER_SITE_OTHER)

## 2024-03-03 ENCOUNTER — Ambulatory Visit: Attending: Cardiology | Admitting: Cardiology

## 2024-03-03 VITALS — BP 133/74 | HR 66 | Ht 61.0 in | Wt 157.0 lb

## 2024-03-03 DIAGNOSIS — R002 Palpitations: Secondary | ICD-10-CM

## 2024-03-03 DIAGNOSIS — R072 Precordial pain: Secondary | ICD-10-CM | POA: Diagnosis not present

## 2024-03-03 NOTE — Patient Instructions (Addendum)
 Medication Instructions:  Your physician recommends that you continue on your current medications as directed. Please refer to the Current Medication list given to you today.  *If you need a refill on your cardiac medications before your next appointment, please call your pharmacy*  Testing/Procedures: Cardiac CT Your physician has requested that you have cardiac CT. Cardiac computed tomography (CT) is a painless test that uses an x-ray machine to take clear, detailed pictures of your heart. For further information please visit [email or web address] Please follow instruction sheet as given.  Event Monitor  Your physician has recommended that you wear an event monitor. Event monitors are medical devices that record the heart's electrical activity. Doctors most often us  these monitors to diagnose arrhythmias. Arrhythmias are problems with the speed or rhythm of the heartbeat. The monitor is a small, portable device. You can wear one while you do your normal daily activities. This is usually used to diagnose what is causing palpitations/syncope (passing out).   Follow-Up: At [facility], you and your health needs are our priority.  As part of our continuing mission to provide you with exceptional heart care, our providers are all part of one team.  This team includes your primary Cardiologist (physician) and Advanced Practice Providers or APPs (Physician Assistants and Nurse Practitioners) who all work together to provide you with the care you need, when you need it.  Your next appointment:   6 weeks  Provider:   You will see one of the following Advanced Practice Providers on your designated Care Team:   Charlies Arthur, PA-C Ozell Jodie Passey, PA-C Suzann Riddle, NP Daphne Barrack, NP    Other Instructions GEOFFRY HEWS- Long Term Monitor Instructions  Your physician has requested you wear a ZIO patch monitor for 14 days.  This is a single patch monitor. [employer] supplies one patch  monitor per enrollment. Additional stickers are not available. Please do not apply patch if you will be having a Nuclear Stress Test,  Echocardiogram, Cardiac CT, MRI, or Chest Xray during the period you would be wearing the  monitor. The patch cannot be worn during these tests. You cannot remove and re-apply the  ZIO XT patch monitor.  Your ZIO patch monitor will be mailed 3 day USPS to your address on file. It may take 3-5 days  to receive your monitor after you have been enrolled.  Once you have received your monitor, please review the enclosed instructions. Your monitor  has already been registered assigning a specific monitor serial # to you.  Billing and Patient Assistance Program Information  We have supplied [employer] with any of your insurance information on file for billing purposes. [employer] offers a sliding scale Patient Assistance Program for patients that do not have  insurance, or whose insurance does not completely cover the cost of the ZIO monitor.  You must apply for the Patient Assistance Program to qualify for this discounted rate.  To apply, please call [employer] at [phone], select option 4, select option 2, ask to apply for  Patient Assistance Program. Meredeth will ask your household income, and how many people  are in your household. They will quote your out-of-pocket cost based on that information.  [employer] will also be able to set up a 24-month, interest-free payment plan if needed.  Applying the monitor   Shave hair from upper left chest.  Hold abrader disc by orange tab. Rub abrader in 40 strokes over the upper left chest as  indicated in your monitor instructions.  Clean area with 4 enclosed alcohol pads. Let dry.  Apply patch as indicated in monitor instructions. Patch will be placed under collarbone on left  side of chest with arrow pointing upward.  Rub patch adhesive wings for 2 minutes. Remove white label marked 1. Remove the white  label marked 2.  Rub patch adhesive wings for 2 additional minutes.  While looking in a mirror, press and release button in center of patch. A small green light will  flash 3-4 times. This will be your only indicator that the monitor has been turned on.  Do not shower for the first 24 hours. You may shower after the first 24 hours.  Press the button if you feel a symptom. You will hear a small click. Record Date, Time and  Symptom in the Patient Logbook.  When you are ready to remove the patch, follow instructions on the last 2 pages of Patient  Logbook. Stick patch monitor onto the last page of Patient Logbook.  Place Patient Logbook in the blue and white box. Use locking tab on box and tape box closed  securely. The blue and white box has prepaid postage on it. Please place it in the mailbox as  soon as possible. Your physician should have your test results approximately 7 days after the  monitor has been mailed back to [facility].  Call [facility] Customer Care at [phone] if you have questions regarding  your ZIO XT patch monitor. Call them immediately if you see an orange light blinking on your  monitor.  If your monitor falls off in less than 4 days, contact our Monitor department at [phone].  If your monitor becomes loose or falls off after 4 days call [employer] at [phone] for  suggestions on securing your monitor

## 2024-03-03 NOTE — Progress Notes (Unsigned)
 Enrolled patient for a 14 day Zio XT  monitor to be mailed to patients home

## 2024-03-17 ENCOUNTER — Ambulatory Visit (HOSPITAL_COMMUNITY)

## 2024-03-21 ENCOUNTER — Encounter (HOSPITAL_COMMUNITY): Payer: Self-pay

## 2024-03-22 ENCOUNTER — Telehealth (HOSPITAL_COMMUNITY): Payer: Self-pay | Admitting: *Deleted

## 2024-03-22 NOTE — Telephone Encounter (Signed)
 Reaching out to patient to offer assistance regarding upcoming cardiac imaging study; pt verbalizes understanding of appt date/time, parking situation and where to check in, pre-test NPO status and medications ordered, and verified current allergies; name and call back number provided for further questions should they arise Johney Frame RN Navigator Cardiac Imaging [facility] [phone] office [phone] cell

## 2024-03-23 ENCOUNTER — Ambulatory Visit (HOSPITAL_COMMUNITY)
Admission: RE | Admit: 2024-03-23 | Discharge: 2024-03-23 | Disposition: A | Discharge status: Home / Self Care | Source: Ambulatory Visit | Attending: Cardiology | Admitting: Cardiology

## 2024-03-23 DIAGNOSIS — R072 Precordial pain: Secondary | ICD-10-CM

## 2024-03-23 DIAGNOSIS — I2510 Atherosclerotic heart disease of native coronary artery without angina pectoris: Secondary | ICD-10-CM | POA: Diagnosis not present

## 2024-03-23 MED ORDER — IOHEXOL 350 MG/ML SOLN
100.0000 mL | Freq: Once | INTRAVENOUS | Status: AC | PRN
Start: 2024-03-23 — End: 2024-03-23
  Administered 2024-03-23: 100 mL via INTRAVENOUS

## 2024-03-23 MED ORDER — NITROGLYCERIN 0.4 MG SL SUBL: 0.8000 mg | SUBLINGUAL_TABLET | Freq: Once | SUBLINGUAL | Status: DC

## 2024-03-24 ENCOUNTER — Ambulatory Visit: Payer: Self-pay | Admitting: Cardiology

## 2024-03-24 NOTE — Telephone Encounter (Signed)
 Pt aware of her cardiac ct results.  She is aware that she will need a Heart Cath.  Per pt, she is at [facility] living and she needs to discuss with her kids before scheduling so she can arrange transportation, and someone to be with her the 1st 24 hours.  She will call back next week to arrange.

## 2024-03-27 NOTE — Telephone Encounter (Signed)
 Patient is following up requesting to review results again. Please advise.

## 2024-03-30 ENCOUNTER — Ambulatory Visit: Attending: Cardiology | Admitting: Cardiology

## 2024-03-30 ENCOUNTER — Other Ambulatory Visit (HOSPITAL_COMMUNITY): Payer: Self-pay

## 2024-03-30 ENCOUNTER — Encounter: Payer: Self-pay | Admitting: Cardiology

## 2024-03-30 VITALS — BP 153/76 | HR 70 | Resp 16 | Ht 61.0 in | Wt 155.4 lb

## 2024-03-30 DIAGNOSIS — R9389 Abnormal findings on diagnostic imaging of other specified body structures: Secondary | ICD-10-CM | POA: Diagnosis not present

## 2024-03-30 DIAGNOSIS — R1013 Epigastric pain: Secondary | ICD-10-CM

## 2024-03-30 DIAGNOSIS — I2510 Atherosclerotic heart disease of native coronary artery without angina pectoris: Secondary | ICD-10-CM

## 2024-03-30 DIAGNOSIS — I10 Essential (primary) hypertension: Secondary | ICD-10-CM

## 2024-03-30 DIAGNOSIS — R931 Abnormal findings on diagnostic imaging of heart and coronary circulation: Secondary | ICD-10-CM

## 2024-03-30 DIAGNOSIS — E782 Mixed hyperlipidemia: Secondary | ICD-10-CM

## 2024-03-30 DIAGNOSIS — R079 Chest pain, unspecified: Secondary | ICD-10-CM

## 2024-03-30 MED ORDER — NITROGLYCERIN 0.4 MG SL SUBL
0.4000 mg | SUBLINGUAL_TABLET | SUBLINGUAL | 0 refills | Status: AC | PRN
  Filled 2024-03-30: qty 25, 25d supply, fill #0

## 2024-03-30 NOTE — Progress Notes (Signed)
 Cardiology Office Note:  .   ID:  Marie Harrison, DOB [birth year], MRN [id number] PCP:  Feliciano Devoria LABOR, MD  [facility] Providers Cardiologist:  Madonna Large, DO Electrophysiologist:  OLE ONEIDA HOLTS, MD  Electrophysiologist:  OLE ONEIDA HOLTS, MD  Click to update primary MD,subspecialty MD or APP then REFRESH:1}    Chief Complaint  Patient presents with   Follow-up    Go over coronary CTA results    History of Present Illness: .   Marie Harrison is a 83 y.o. Caucasian female whose past medical history and cardiovascular risk factors includes: Severe coronary artery calcification, multivessel coronary artery disease, hypertension, hyperlipidemia, IBS, tremors.  Patient was last seen by Dr. OLE HOLTS in consultation for palpitations back on March 03, 2024.  In addition to palpitations she also endorsed chest pain, located centrally and near the epigastric area.  The symptoms are more pronounced 2 to 3 hours after a meal.  No significant correlation to exertion.  But given her advanced age and multiple risk factors coronary CTA was requested.  Patient did undergo coronary CTA and noted to have multivessel CAD with hemodynamically significant stenosis per FFR.  She was asked to come in sooner to discuss left heart catheterization with possible intervention.  Patient is accompanied by her daughter Dorthea at today's visit.  Patient states that she has intermittent epigastric/lower chest pain that comes and goes spontaneously.  The pain is not usually brought on by effort related activities, does not resolve with resting.  No active precordial or epigastric discomfort.  With regards to her palpitations the symptoms are still present, frequent, categorizes as parts of episodes when she feels her heart marching, self-limited.  No improving or worsening factors.  Review of Systems: .   Review of Systems  Cardiovascular:  Positive for chest pain (epigastric). Negative for  claudication, irregular heartbeat, leg swelling, near-syncope, orthopnea, palpitations, paroxysmal nocturnal dyspnea and syncope.  Respiratory:  Negative for shortness of breath.   Hematologic/Lymphatic: Negative for bleeding problem.    Studies Reviewed:   EKG Interpretation Date/Time:  Thursday March 30 2024 16:31:04 EDT Ventricular Rate:  67 PR Interval:  154 QRS Duration:  74 QT Interval:  448 QTC Calculation: 473 R Axis:   -10  Text Interpretation: Normal sinus rhythm Low voltage QRS Cannot rule out Anterior infarct (cited on or before 30-Mar-2024) When compared with ECG of 03-Mar-2024 10:03, No significant change was found Confirmed by Large Madonna [phone]) on 04/03/2024 8:18:46 AM  CCTA 03/23/2024 1. Coronary artery calcium score 1647 Agatston units. This places the patient in the 98th percentile for age and gender.   2. The left main has extensive plaque, visually appears to have moderate stenosis but is not hemodynamically significant by CT FFR.   3. Extensive plaque in the proximal to mid LAD with severe stenosis that is hemodynamically significant by CT FFR.   4.  Nonobstructive disease in the LCx.   5. Noncalcified plaque with visually moderate stenosis in the proximal RCA. This is not hemodynamically significant by CT FFR but I worry this could be a higher risk plaque (noncalcified/soft).   Consider cardiac catheterization.  Cardiac monitor: Zio Monitor  Final report pending. Underlying rhythm sinus with episodes of PSVT.  No obvious atrial fibrillation episodes.  RADIOLOGY: NA  Risk Assessment/Calculations:   NA   Labs:       Latest Ref Rng & Units 03/31/2024    2:43 PM 05/15/2020    7:17 AM 04/28/2016  2:36 PM  CBC  WBC 3.4 - 10.8 x10E3/uL 10.7   17.9   Hemoglobin 11.1 - 15.9 g/dL 88.0  87.0  86.9   Hematocrit 34.0 - 46.6 % 37.4  38.0  38.2   Platelets 150 - 450 x10E3/uL 371   394        Latest Ref Rng & Units 03/31/2024    2:43 PM 05/15/2020     7:17 AM 04/28/2016    2:36 PM  BMP  Glucose 70 - 99 mg/dL 894  893  872   BUN 8 - 27 mg/dL 22  10  13    Creatinine 0.57 - 1.00 mg/dL 9.31  9.49  9.44   BUN/Creat Ratio 12 - 28 32     Sodium 134 - 144 mmol/L 132  135  130   Potassium 3.5 - 5.2 mmol/L 4.1  3.6  3.4   Chloride 96 - 106 mmol/L 93  95  92   CO2 20 - 29 mmol/L 22   30   Calcium 8.7 - 10.3 mg/dL 9.3   9.3       Latest Ref Rng & Units 03/31/2024    2:43 PM 05/15/2020    7:17 AM 04/28/2016    2:36 PM  CMP  Glucose 70 - 99 mg/dL 894  893  872   BUN 8 - 27 mg/dL 22  10  13    Creatinine 0.57 - 1.00 mg/dL 9.31  9.49  9.44   Sodium 134 - 144 mmol/L 132  135  130   Potassium 3.5 - 5.2 mmol/L 4.1  3.6  3.4   Chloride 96 - 106 mmol/L 93  95  92   CO2 20 - 29 mmol/L 22   30   Calcium 8.7 - 10.3 mg/dL 9.3   9.3   Total Protein 6.0 - 8.5 g/dL 6.9   7.6   Total Bilirubin 0.0 - 1.2 mg/dL 0.3   1.1   Alkaline Phos 44 - 121 IU/L 114   56   AST 0 - 40 IU/L 24   49   ALT 0 - 32 IU/L 37   36     Lab Results  Component Value Date   CHOL 141 04/10/2014   HDL 59.10 04/10/2014   LDLCALC 73 04/10/2014   TRIG 47.0 04/10/2014   CHOLHDL 2 04/10/2014   No results for input(s): LIPOA in the last 8760 hours. No components found for: NTPROBNP No results for input(s): PROBNP in the last 8760 hours. No results for input(s): TSH in the last 8760 hours.  Physical Exam:    Today's Vitals   03/30/24 1535  BP: (!) 153/76  Pulse: 70  Resp: 16  SpO2: 94%  Weight: 155 lb 6.4 oz (70.5 kg)  Height: 5' 1 (1.549 m)   Body mass index is 29.36 kg/m. Wt Readings from Last 3 Encounters:  03/30/24 155 lb 6.4 oz (70.5 kg)  03/03/24 157 lb (71.2 kg)  11/25/22 171 lb (77.6 kg)    Physical Exam  Constitutional: No distress.  hemodynamically stable  Neck: No JVD present.  Cardiovascular: Normal rate, regular rhythm, S1 normal and S2 normal. Exam reveals no gallop, no S3 and no S4.  No murmur heard. Pulmonary/Chest: Effort normal and  breath sounds normal. No stridor. She has no wheezes. She has no rales.  Musculoskeletal:        General: No edema.     Cervical back: Neck supple.  Skin: Skin is warm.  Impression & Recommendation(s):  Impression:   ICD-10-CM   1. Abnormal computed tomography angiography (CTA)  R93.89 Comp Met (CMET)    CBC    CBC    Comp Met (CMET)    2. Epigastric discomfort  R10.13 Comp Met (CMET)    CBC    CBC    Comp Met (CMET)    EKG 12-Lead    3. Atherosclerosis of native coronary artery of native heart without angina pectoris  I25.10     4. Agatston CAC score, >400  R93.1     5. Benign hypertension  I10     6. Mixed hyperlipidemia  E78.2        Recommendation(s):  Abnormal computed tomography angiography (CTA) Epigastric discomfort Atherosclerosis of native coronary artery of native heart without angina pectoris Agatston CAC score, >400 No active chest pain/epigastric discomfort. EKG is nonischemic Underwent coronary CTA to evaluate for CAD and was noted to have severe coronary artery calcification and hemodynamically significant stenoses. Patient referred to cardiology ASAP to reevaluate symptoms and to arrange left heart catheterization with possible intervention. Results of the coronary CTA reviewed with the patient and her daughter at today's visit. Continue aspirin  81 mg p.o. daily. Continue lovastatin  40 mg p.o. q. Supper Sublingual nitroglycerin  tablets to use on as needed basis.  Medication profile discussed.  Because she has RCA disease recommended using no more than 1 tablet if no improvement in symptoms to call 911 or go to the closest ER via EMS. Informed Consent   Shared Decision Making/Informed Consent The risks [stroke (1 in 1000), death (1 in 1000), kidney failure [usually temporary] (1 in 500), bleeding (1 in 200), allergic reaction [possibly serious] (1 in 200)], benefits (diagnostic support and management of coronary artery disease) and alternatives of a  cardiac catheterization were discussed in detail with Ms. Ohm and she is willing to proceed.    Benign hypertension Office blood pressures are elevated. Currently on hydrochlorothiazide  25 mg p.o. every morning, amlodipine 2.5 mg p.o. daily, lisinopril  40 mg p.o. daily Reemphasize importance of checking ambulatory blood pressure readings Reemphasized importance of low-salt diet.  Mixed hyperlipidemia Currently on lovastatin  40 mg p.o. daily. I do not have the most recent lipid profile available for review. Currently being managed by PCP. Recommend a goal LDL at least <70 mg/dL; however, given the severe CAC may benefit from a goal of 55 mg/dL.   Orders Placed:  Orders Placed This Encounter  Procedures   Comp Met (CMET)    Standing Status:   Future    Number of Occurrences:   1    Expected Date:   03/30/2024    Expiration Date:   03/30/2025   CBC    Standing Status:   Future    Number of Occurrences:   1    Expected Date:   03/30/2024    Expiration Date:   03/30/2025   EKG 12-Lead     Final Medication List:    Meds ordered this encounter  Medications   nitroGLYCERIN  (NITROSTAT ) 0.4 MG SL tablet    Sig: Place 1 tablet (0.4 mg total) under the tongue every 5 (five) minutes as needed for chest pain. If you have to take 1 tablet for chest pain, go to the emergency room.    Dispense:  25 tablet    Refill:  0    Medications Discontinued During This Encounter  Medication Reason   nitrofurantoin  (MACRODANTIN ) 100 MG capsule Completed Course     Current Outpatient  Medications:    acetaminophen  (TYLENOL ) 500 MG tablet, Take 1,000 mg by mouth every 6 (six) hours as needed., Disp: , Rfl:    amLODipine (NORVASC) 2.5 MG tablet, Take 2.5 mg by mouth daily., Disp: , Rfl:    aspirin  EC 81 MG tablet, Take 81 mg by mouth daily. Swallow whole., Disp: , Rfl:    Cholecalciferol 250 MCG (10000 UT) CAPS, Take 10,000 Units by mouth., Disp: , Rfl:    Cyanocobalamin 2500 MCG SUBL, Place 1 tablet  under the tongue., Disp: , Rfl:    fexofenadine (ALLEGRA ALLERGY) 180 MG tablet, Take 180 mg by mouth daily., Disp: , Rfl:    FLUoxetine  (PROZAC ) 40 MG capsule, Take 40 mg by mouth daily., Disp: , Rfl:    fluticasone  (FLONASE ) 50 MCG/ACT nasal spray, Place 1 spray into both nostrils daily as needed. , Disp: , Rfl:    hydrochlorothiazide  (HYDRODIURIL ) 25 MG tablet, Take 1 tablet (25 mg total) by mouth every morning., Disp: 90 tablet, Rfl: 3   lisinopril  (ZESTRIL ) 40 MG tablet, Take 40 mg by mouth daily., Disp: , Rfl:    lovastatin  (MEVACOR ) 40 MG tablet, Take 1 tablet (40 mg total) by mouth daily with supper., Disp: 90 tablet, Rfl: 3   nitroGLYCERIN  (NITROSTAT ) 0.4 MG SL tablet, Place 1 tablet (0.4 mg total) under the tongue every 5 (five) minutes as needed for chest pain. If you have to take 1 tablet for chest pain, go to the emergency room., Disp: 25 tablet, Rfl: 0   omeprazole  (PRILOSEC) 20 MG capsule, Take 20 mg by mouth daily. (Patient taking differently: Take 20 mg by mouth as needed.), Disp: , Rfl:    potassium chloride  (KLOR-CON ) 10 MEQ tablet, Take 10 mEq by mouth 2 (two) times daily., Disp: , Rfl:    triamcinolone  cream (KENALOG ) 0.1 %, Apply 1 application topically daily as needed. , Disp: , Rfl:    UNABLE TO FIND, viatmin b12 sl 2500 mg daily, Disp: , Rfl:   Consent:   NA  Disposition:   3 months or sooner if needed.  Her questions and concerns were addressed to her satisfaction. She voices understanding of the recommendations provided during this encounter.    Signed, [name] HAS, [facility]  A Division of [facility] [address] [place], [place] [address]  [place], [place] [address] 04/03/2024 8:19 AM

## 2024-03-30 NOTE — Patient Instructions (Signed)
 Medication Instructions:   AS NEEDED Nitroglycerin  0.4 mg SL for chest pain   IF YOU HAVE TO TAKE ONE NITROGLYCERIN  TABLET FOR CHEST PAIN, GO TO THE EMERGENCY ROOM  *If you need a refill on your cardiac medications before your next appointment, please call your pharmacy*  Lab Work: CMP and CBC today   If you have labs (blood work) drawn today and your tests are completely normal, you will receive your results only by: MyChart Message (if you have MyChart) OR A paper copy in the mail If you have any lab test that is abnormal or we need to change your treatment, we will call you to review the results.  Testing/Procedures: Your physician has requested that you have a cardiac catheterization. Cardiac catheterization is used to diagnose and/or treat various heart conditions. Doctors may recommend this procedure for a number of different reasons. The most common reason is to evaluate chest pain. Chest pain can be a symptom of coronary artery disease (CAD), and cardiac catheterization can show whether plaque is narrowing or blocking your heart's arteries. This procedure is also used to evaluate the valves, as well as measure the blood flow and oxygen levels in different parts of your heart. For further information please visit [email or web address] Please follow instruction sheet, as given.   Follow-Up: At [facility], you and your health needs are our priority.  As part of our continuing mission to provide you with exceptional heart care, our providers are all part of one team.  This team includes your primary Cardiologist (physician) and Advanced Practice Providers or APPs (Physician Assistants and Nurse Practitioners) who all work together to provide you with the care you need, when you need it.  Your next appointment:   3 month(s)  Provider:   Madonna Large, DO          Cardiac/Peripheral Catheterization   You are scheduled for a Cardiac Catheterization on Thursday, July 31  with Dr. Newman Lawrence.  1. Please arrive at the [facility] (Main Entrance A) at [facility]: [address] [place], [place] [address] at 8:30 AM (This time is TWO hour(s) before your procedure to ensure your preparation).   Free valet parking service is available. You will check in at ADMITTING. The support person will be asked to wait in the waiting room.  It is OK to have someone drop you off and come back when you are ready to be discharged.        Special note: Every effort is made to have your procedure done on time. Please understand that emergencies sometimes delay scheduled procedures.  2. Diet: Do not eat solid foods after midnight.  You may have clear liquids until 5 AM the day of the procedure.  3. Labs: You will need to have blood drawn today.  4. Medication instructions in preparation for your procedure:   Contrast Allergy: No  No hydrochlorothiazide  on the day of the procedure.  On the morning of your procedure, take Aspirin 81 mg and any morning medicines NOT listed above.  You may use sips of water .  5. Plan to go home the same day, you will only stay overnight if medically necessary. 6. You MUST have a responsible adult to drive you home. 7. An adult MUST be with you the first 24 hours after you arrive home. 8. Bring a current list of your medications, and the last time and date medication taken. 9. Bring ID and current insurance cards. 10.Please wear clothes  that are easy to get on and off and wear slip-on shoes.  Thank you for allowing us  to care for you!   -- [facility]

## 2024-04-01 LAB — CBC
Hematocrit: 37.4 % (ref 34.0–46.6)
Hemoglobin: 11.9 g/dL (ref 11.1–15.9)
MCH: 31.4 pg (ref 26.6–33.0)
MCHC: 31.8 g/dL (ref 31.5–35.7)
MCV: 99 fL — ABNORMAL HIGH (ref 79–97)
Platelets: 371 x10E3/uL (ref 150–450)
RBC: 3.79 x10E6/uL (ref 3.77–5.28)
RDW: 17.5 % — ABNORMAL HIGH (ref 11.7–15.4)
WBC: 10.7 x10E3/uL (ref 3.4–10.8)

## 2024-04-01 LAB — COMPREHENSIVE METABOLIC PANEL WITH GFR
ALT: 37 IU/L — ABNORMAL HIGH (ref 0–32)
AST: 24 IU/L (ref 0–40)
Albumin: 4.2 g/dL (ref 3.7–4.7)
Alkaline Phosphatase: 114 IU/L (ref 44–121)
BUN/Creatinine Ratio: 32 — ABNORMAL HIGH (ref 12–28)
BUN: 22 mg/dL (ref 8–27)
Bilirubin Total: 0.3 mg/dL (ref 0.0–1.2)
CO2: 22 mmol/L (ref 20–29)
Calcium: 9.3 mg/dL (ref 8.7–10.3)
Chloride: 93 mmol/L — ABNORMAL LOW (ref 96–106)
Creatinine, Ser: 0.68 mg/dL (ref 0.57–1.00)
Globulin, Total: 2.7 g/dL (ref 1.5–4.5)
Glucose: 105 mg/dL — ABNORMAL HIGH (ref 70–99)
Potassium: 4.1 mmol/L (ref 3.5–5.2)
Sodium: 132 mmol/L — ABNORMAL LOW (ref 134–144)
Total Protein: 6.9 g/dL (ref 6.0–8.5)
eGFR: 85 mL/min/1.73 (ref >59)

## 2024-04-03 ENCOUNTER — Encounter: Payer: Self-pay | Admitting: Cardiology

## 2024-04-04 ENCOUNTER — Telehealth: Payer: Self-pay | Admitting: *Deleted

## 2024-04-04 NOTE — Telephone Encounter (Signed)
 Cardiac Catheterization scheduled at [facility] for: Thursday April 06, 2024 10:30 AM Arrival time [facility] Main Entrance A at: 8:30 AM  Nothing to eat after midnight prior to procedure, stay well hydrated, may drink clear liquids until leaving for hospital. Drink 16 oz bottle of water  on the way to the hospital. (Approved liquids: water , clear juice, clear tea, black coffee, fruit juices, non-citric and without pulp, carbonated beverages, Gatorade, Kool -Aid, plain Jello-O and plain ice popsicles.)  Medication instructions: -Hold:  Hydrochlorothiazide -AM of procedure -Other usual morning medications can be taken with sips of water  including aspirin  81 mg.  Plan to go home the same day, you will only stay overnight if medically necessary.  You must have responsible adult to drive you home.  Someone must be with you the first 24 hours after you arrive home.  Reviewed procedure instructions with patient.

## 2024-04-05 ENCOUNTER — Ambulatory Visit: Payer: Self-pay | Admitting: Cardiology

## 2024-04-05 DIAGNOSIS — R072 Precordial pain: Secondary | ICD-10-CM | POA: Diagnosis not present

## 2024-04-05 DIAGNOSIS — R002 Palpitations: Secondary | ICD-10-CM

## 2024-04-06 ENCOUNTER — Other Ambulatory Visit (HOSPITAL_COMMUNITY): Payer: Self-pay

## 2024-04-06 ENCOUNTER — Other Ambulatory Visit: Payer: Self-pay

## 2024-04-06 ENCOUNTER — Encounter (HOSPITAL_COMMUNITY): Payer: Self-pay | Admitting: Cardiology

## 2024-04-06 ENCOUNTER — Ambulatory Visit (HOSPITAL_COMMUNITY)
Admission: RE | Admit: 2024-04-06 | Discharge: 2024-04-06 | Disposition: A | Discharge status: Home / Self Care | Attending: Cardiology | Admitting: Cardiology

## 2024-04-06 ENCOUNTER — Encounter (HOSPITAL_COMMUNITY)
Admission: RE | Disposition: A | Discharge status: Home / Self Care | Payer: Self-pay | Source: Home / Self Care | Attending: Cardiology

## 2024-04-06 DIAGNOSIS — I25118 Atherosclerotic heart disease of native coronary artery with other forms of angina pectoris: Secondary | ICD-10-CM | POA: Diagnosis present

## 2024-04-06 DIAGNOSIS — E782 Mixed hyperlipidemia: Secondary | ICD-10-CM | POA: Insufficient documentation

## 2024-04-06 DIAGNOSIS — I2510 Atherosclerotic heart disease of native coronary artery without angina pectoris: Secondary | ICD-10-CM

## 2024-04-06 DIAGNOSIS — I10 Essential (primary) hypertension: Secondary | ICD-10-CM | POA: Insufficient documentation

## 2024-04-06 DIAGNOSIS — Z79899 Other long term (current) drug therapy: Secondary | ICD-10-CM | POA: Insufficient documentation

## 2024-04-06 SURGERY — LEFT HEART CATH AND CORONARY ANGIOGRAPHY
Anesthesia: LOCAL

## 2024-04-06 MED ORDER — HEPARIN SODIUM (PORCINE) 1000 UNIT/ML IJ SOLN
INTRAMUSCULAR | Status: AC
  Filled 2024-04-06: qty 10

## 2024-04-06 MED ORDER — SODIUM CHLORIDE 0.9 % IV SOLN
INTRAVENOUS | Status: DC | PRN
  Administered 2024-04-06: 250 mL via INTRAVENOUS

## 2024-04-06 MED ORDER — SODIUM CHLORIDE 0.9% FLUSH: 3.0000 mL | Freq: Two times a day (BID) | INTRAVENOUS | Status: DC

## 2024-04-06 MED ORDER — ASPIRIN 81 MG PO CHEW: 81.0000 mg | CHEWABLE_TABLET | ORAL | Status: DC

## 2024-04-06 MED ORDER — VERAPAMIL HCL 2.5 MG/ML IV SOLN
INTRAVENOUS | Status: AC
  Filled 2024-04-06: qty 2

## 2024-04-06 MED ORDER — FREE WATER: 500.0000 mL | Freq: Once | Status: DC

## 2024-04-06 MED ORDER — METOPROLOL SUCCINATE ER 25 MG PO TB24
25.0000 mg | ORAL_TABLET | Freq: Every day | ORAL | 2 refills | Status: DC
  Filled 2024-04-06: qty 30, 30d supply, fill #0

## 2024-04-06 MED ORDER — VERAPAMIL HCL 2.5 MG/ML IV SOLN
INTRAVENOUS | Status: DC | PRN
  Administered 2024-04-06: 10 mL via INTRA_ARTERIAL

## 2024-04-06 MED ORDER — SODIUM CHLORIDE 0.9 % IV SOLN: 250.0000 mL | INTRAVENOUS | Status: DC | PRN

## 2024-04-06 MED ORDER — LIDOCAINE HCL (PF) 1 % IJ SOLN
INTRAMUSCULAR | Status: AC
  Filled 2024-04-06: qty 30

## 2024-04-06 MED ORDER — ONDANSETRON HCL 4 MG/2ML IJ SOLN: 4.0000 mg | Freq: Four times a day (QID) | INTRAMUSCULAR | Status: DC | PRN

## 2024-04-06 MED ORDER — LIDOCAINE HCL (PF) 1 % IJ SOLN
INTRAMUSCULAR | Status: DC | PRN
  Administered 2024-04-06: 2 mL
  Administered 2024-04-06: 5 mL

## 2024-04-06 MED ORDER — ACETAMINOPHEN 325 MG PO TABS: 650.0000 mg | ORAL_TABLET | ORAL | Status: DC | PRN

## 2024-04-06 MED ORDER — IOHEXOL 350 MG/ML SOLN
INTRAVENOUS | Status: DC | PRN
  Administered 2024-04-06: 55 mL

## 2024-04-06 MED ORDER — HYDRALAZINE HCL 20 MG/ML IJ SOLN: 10.0000 mg | INTRAMUSCULAR | Status: DC | PRN

## 2024-04-06 MED ORDER — HEPARIN (PORCINE) IN NACL 1000-0.9 UT/500ML-% IV SOLN
INTRAVENOUS | Status: DC | PRN
  Administered 2024-04-06 (×2): 500 mL

## 2024-04-06 MED ORDER — MIDAZOLAM HCL 2 MG/2ML IJ SOLN
INTRAMUSCULAR | Status: AC
Start: 2024-04-06 — End: 2024-04-06
  Filled 2024-04-06: qty 2

## 2024-04-06 MED ORDER — SODIUM CHLORIDE 0.9% FLUSH: 3.0000 mL | INTRAVENOUS | Status: DC | PRN

## 2024-04-06 MED ORDER — HEPARIN SODIUM (PORCINE) 1000 UNIT/ML IJ SOLN
INTRAMUSCULAR | Status: DC | PRN
  Administered 2024-04-06: 3500 [IU] via INTRAVENOUS

## 2024-04-06 MED ORDER — LABETALOL HCL 5 MG/ML IV SOLN: 10.0000 mg | INTRAVENOUS | Status: DC | PRN

## 2024-04-06 MED ORDER — FENTANYL CITRATE (PF) 100 MCG/2ML IJ SOLN
INTRAMUSCULAR | Status: AC
  Filled 2024-04-06: qty 2

## 2024-04-06 MED ORDER — SODIUM CHLORIDE 0.9% FLUSH
3.0000 mL | INTRAVENOUS | Status: DC | PRN
Start: 2024-04-06 — End: 2024-04-06

## 2024-04-06 MED ORDER — NITROGLYCERIN 1 MG/10 ML FOR IR/CATH LAB
INTRA_ARTERIAL | Status: DC | PRN
  Administered 2024-04-06: 300 ug via INTRA_ARTERIAL
  Administered 2024-04-06: 200 ug via INTRA_ARTERIAL

## 2024-04-06 MED ORDER — MIDAZOLAM HCL 2 MG/2ML IJ SOLN
INTRAMUSCULAR | Status: DC | PRN
Start: 2024-04-06 — End: 2024-04-06
  Administered 2024-04-06: 1 mg via INTRAVENOUS

## 2024-04-06 MED ORDER — FENTANYL CITRATE (PF) 100 MCG/2ML IJ SOLN
INTRAMUSCULAR | Status: DC | PRN
  Administered 2024-04-06 (×2): 25 ug via INTRAVENOUS

## 2024-04-06 MED ORDER — NITROGLYCERIN 1 MG/10 ML FOR IR/CATH LAB
INTRA_ARTERIAL | Status: AC
  Filled 2024-04-06: qty 10

## 2024-04-06 SURGICAL SUPPLY — 13 items
CATH INFINITI 5FR MULTPACK ANG (CATHETERS) IMPLANT
CATH INFINITI AMBI 5FR TG (CATHETERS) IMPLANT
CLOSURE MYNX CONTROL 5F (Vascular Products) IMPLANT
DEVICE RAD COMP TR BAND LRG (VASCULAR PRODUCTS) IMPLANT
GLIDESHEATH SLEND A-KIT 6F 22G (SHEATH) IMPLANT
GUIDEWIRE INQWIRE 1.5J.035X260 (WIRE) IMPLANT
KIT MICROPUNCTURE NIT STIFF (SHEATH) IMPLANT
PACK CARDIAC CATHETERIZATION (CUSTOM PROCEDURE TRAY) ×2 IMPLANT
SET ATX-X65L (MISCELLANEOUS) IMPLANT
SHEATH 6FR 75 DEST SLENDER (SHEATH) IMPLANT
SHEATH PINNACLE 5F 10CM (SHEATH) IMPLANT
SHEATH PROBE COVER 6X72 (BAG) IMPLANT
WIRE HI TORQ VERSACORE-J 145CM (WIRE) IMPLANT

## 2024-04-06 NOTE — H&P (Signed)
 OV 03/30/2024 copied for documentation   Cardiology Office Note:  .   ID:  Marie Harrison, DOB Apr 20, 1941, MRN [id number] PCP:  Feliciano Devoria LABOR, MD  [facility] Providers Cardiologist:  Madonna Large, DO Electrophysiologist:  OLE ONEIDA HOLTS, MD  Electrophysiologist:  OLE ONEIDA HOLTS, MD  Click to update primary MD,subspecialty MD or APP then REFRESH:1}    No chief complaint on file.   History of Present Illness: .   Marie Harrison is a 83 y.o. Caucasian female whose past medical history and cardiovascular risk factors includes: Severe coronary artery calcification, multivessel coronary artery disease, hypertension, hyperlipidemia, IBS, tremors.  Patient was last seen by Dr. OLE HOLTS in consultation for palpitations back on March 03, 2024.  In addition to palpitations she also endorsed chest pain, located centrally and near the epigastric area.  The symptoms are more pronounced 2 to 3 hours after a meal.  No significant correlation to exertion.  But given her advanced age and multiple risk factors coronary CTA was requested.  Patient did undergo coronary CTA and noted to have multivessel CAD with hemodynamically significant stenosis per FFR.  She was asked to come in sooner to discuss left heart catheterization with possible intervention.  Patient is accompanied by her daughter Marie Harrison at today's visit.  Patient states that she has intermittent epigastric/lower chest pain that comes and goes spontaneously.  The pain is not usually brought on by effort related activities, does not resolve with resting.  No active precordial or epigastric discomfort.  With regards to her palpitations the symptoms are still present, frequent, categorizes as parts of episodes when she feels her heart marching, self-limited.  No improving or worsening factors.  Review of Systems: .   Review of Systems  Cardiovascular:  Positive for chest pain (epigastric). Negative for claudication, irregular  heartbeat, leg swelling, near-syncope, orthopnea, palpitations, paroxysmal nocturnal dyspnea and syncope.  Respiratory:  Negative for shortness of breath.   Hematologic/Lymphatic: Negative for bleeding problem.    Studies Reviewed:   EKG Interpretation Date/Time:    Ventricular Rate:    PR Interval:    QRS Duration:    QT Interval:    QTC Calculation:   R Axis:      Text Interpretation:    CCTA 03/23/2024 1. Coronary artery calcium score 1647 Agatston units. This places the patient in the 98th percentile for age and gender.   2. The left main has extensive plaque, visually appears to have moderate stenosis but is not hemodynamically significant by CT FFR.   3. Extensive plaque in the proximal to mid LAD with severe stenosis that is hemodynamically significant by CT FFR.   4.  Nonobstructive disease in the LCx.   5. Noncalcified plaque with visually moderate stenosis in the proximal RCA. This is not hemodynamically significant by CT FFR but I worry this could be a higher risk plaque (noncalcified/soft).   Consider cardiac catheterization.  Cardiac monitor: Zio Monitor  Final report pending. Underlying rhythm sinus with episodes of PSVT.  No obvious atrial fibrillation episodes.  RADIOLOGY: NA  Risk Assessment/Calculations:   NA   Labs:       Latest Ref Rng & Units 03/31/2024    2:43 PM 05/15/2020    7:17 AM 04/28/2016    2:36 PM  CBC  WBC 3.4 - 10.8 x10E3/uL 10.7   17.9   Hemoglobin 11.1 - 15.9 g/dL 88.0  87.0  86.9   Hematocrit 34.0 - 46.6 % 37.4  38.0  38.2  Platelets 150 - 450 x10E3/uL 371   394        Latest Ref Rng & Units 03/31/2024    2:43 PM 05/15/2020    7:17 AM 04/28/2016    2:36 PM  BMP  Glucose 70 - 99 mg/dL 894  893  872   BUN 8 - 27 mg/dL 22  10  13    Creatinine 0.57 - 1.00 mg/dL 9.31  9.49  9.44   BUN/Creat Ratio 12 - 28 32     Sodium 134 - 144 mmol/L 132  135  130   Potassium 3.5 - 5.2 mmol/L 4.1  3.6  3.4   Chloride 96 - 106 mmol/L 93  95   92   CO2 20 - 29 mmol/L 22   30   Calcium 8.7 - 10.3 mg/dL 9.3   9.3       Latest Ref Rng & Units 03/31/2024    2:43 PM 05/15/2020    7:17 AM 04/28/2016    2:36 PM  CMP  Glucose 70 - 99 mg/dL 894  893  872   BUN 8 - 27 mg/dL 22  10  13    Creatinine 0.57 - 1.00 mg/dL 9.31  9.49  9.44   Sodium 134 - 144 mmol/L 132  135  130   Potassium 3.5 - 5.2 mmol/L 4.1  3.6  3.4   Chloride 96 - 106 mmol/L 93  95  92   CO2 20 - 29 mmol/L 22   30   Calcium 8.7 - 10.3 mg/dL 9.3   9.3   Total Protein 6.0 - 8.5 g/dL 6.9   7.6   Total Bilirubin 0.0 - 1.2 mg/dL 0.3   1.1   Alkaline Phos 44 - 121 IU/L 114   56   AST 0 - 40 IU/L 24   49   ALT 0 - 32 IU/L 37   36     Lab Results  Component Value Date   CHOL 141 04/10/2014   HDL 59.10 04/10/2014   LDLCALC 73 04/10/2014   TRIG 47.0 04/10/2014   CHOLHDL 2 04/10/2014   No results for input(s): LIPOA in the last 8760 hours. No components found for: NTPROBNP No results for input(s): PROBNP in the last 8760 hours. No results for input(s): TSH in the last 8760 hours.  Physical Exam:    There were no vitals filed for this visit.  There is no height or weight on file to calculate BMI. Wt Readings from Last 3 Encounters:  03/30/24 70.5 kg  03/03/24 71.2 kg  11/25/22 77.6 kg    Physical Exam  Constitutional: No distress.  hemodynamically stable  Neck: No JVD present.  Cardiovascular: Normal rate, regular rhythm, S1 normal and S2 normal. Exam reveals no gallop, no S3 and no S4.  No murmur heard. Pulmonary/Chest: Effort normal and breath sounds normal. No stridor. She has no wheezes. She has no rales.  Musculoskeletal:        General: No edema.     Cervical back: Neck supple.  Skin: Skin is warm.     Impression & Recommendation(s):  Impression: No diagnosis found.    Recommendation(s):  Abnormal computed tomography angiography (CTA) Epigastric discomfort Atherosclerosis of native coronary artery of native heart without angina  pectoris Agatston CAC score, >400 No active chest pain/epigastric discomfort. EKG is nonischemic Underwent coronary CTA to evaluate for CAD and was noted to have severe coronary artery calcification and hemodynamically significant stenoses. Patient referred to cardiology ASAP to  reevaluate symptoms and to arrange left heart catheterization with possible intervention. Results of the coronary CTA reviewed with the patient and her daughter at today's visit. Continue aspirin  81 mg p.o. daily. Continue lovastatin  40 mg p.o. q. Supper Sublingual nitroglycerin  tablets to use on as needed basis.  Medication profile discussed.  Because she has RCA disease recommended using no more than 1 tablet if no improvement in symptoms to call 911 or go to the closest ER via EMS. Informed Consent   Shared Decision Making/Informed Consent The risks [stroke (1 in 1000), death (1 in 1000), kidney failure [usually temporary] (1 in 500), bleeding (1 in 200), allergic reaction [possibly serious] (1 in 200)], benefits (diagnostic support and management of coronary artery disease) and alternatives of a cardiac catheterization were discussed in detail with Ms. Ruis and she is willing to proceed.    Benign hypertension Office blood pressures are elevated. Currently on hydrochlorothiazide  25 mg p.o. every morning, amlodipine 2.5 mg p.o. daily, lisinopril  40 mg p.o. daily Reemphasize importance of checking ambulatory blood pressure readings Reemphasized importance of low-salt diet.  Mixed hyperlipidemia Currently on lovastatin  40 mg p.o. daily. I do not have the most recent lipid profile available for review. Currently being managed by PCP. Recommend a goal LDL at least <70 mg/dL; however, given the severe CAC may benefit from a goal of 55 mg/dL.   Orders Placed:  Orders Placed This Encounter  Procedures   Informed Consent Details: Physician/Practitioner Attestation; Transcribe to consent form and obtain patient  signature    Standing Status:   Standing    Number of Occurrences:   1    Physician/Practitioner attestation of informed consent for procedure/surgical case:   I, the physician/practitioner, attest that I have discussed with the patient the benefits, risks, side effects, alternatives, likelihood of achieving goals and potential problems during recovery for the procedure that I have provided informed consent.    Procedure:   Left Heart Cath and Coronary Angiography with possible Percutaneous Coronary Intervention    Physician/Practitioner performing the procedure:   Dr Newman Lawrence    Indication/Reason:   Abnormal computed tomography angiography (CTA)   Apply Cardiac or Vascular Catheterization and/or Intervention Care Plan    Standing Status:   Standing    Number of Occurrences:   1   Confirm CBC and BMP (or CMP) results within 7 days for inpatient and 30 days for outpatient: Outpatients with severe anemia (hgb<10, CKD, severe thrombocytopenia plts<100) labs should be within 10 days. Only draw PT/INR on patients that are on Coumadin, Hgb<10, have liver disease (cirrhosis, liver CA, hepatitis, etc). Urine pregnancy test within hospital admission for inpatients of child bearing age, for outpatients day of procedure.    Standing Status:   Standing    Number of Occurrences:   1   Confirm EKG performed within 30 days for cardiac procedures and 12 months for peripheral vascular procedures.  Place order for EKG if missing or not within timeframe.    Standing Status:   Standing    Number of Occurrences:   1   Verify aspirin  and / or anti-platelet medication (Plavix, Effient, Brilinta) dose available for cardiac / peripheral vascular procedure day. IF ordered daily / once, adjust schedule to administer before procedure.    Standing Status:   Standing    Number of Occurrences:   1   Weigh patient    Standing Status:   Standing    Number of Occurrences:   1   Initiate Cath/PCI  clinical path;  encourage patient to watch CCTV video    encourage patient to watch CCTV video    Standing Status:   Standing    Number of Occurrences:   1   Insert peripheral IV    Avoid wrist if possible.    Standing Status:   Standing    Number of Occurrences:   1   Insert 2nd peripheral IV site-Saline lock IV    If scheduled for left and right heart cath or right heart cath, please insert 20 gauge NSL in Rt AC    Standing Status:   Standing    Number of Occurrences:   1     Final Medication List:    Meds ordered this encounter  Medications   aspirin  chewable tablet 81 mg   free water  500 mL   sodium chloride  flush (NS) 0.9 % injection 3 mL   sodium chloride  flush (NS) 0.9 % injection 3 mL   0.9 %  sodium chloride  infusion    Medications Discontinued During This Encounter  Medication Reason   potassium chloride  (KLOR-CON ) 10 MEQ tablet Patient Preference   UNABLE TO FIND Patient Preference     Current Facility-Administered Medications:    0.9 %  sodium chloride  infusion, 250 mL, Intravenous, PRN, Tolia, Sunit, DO   aspirin  chewable tablet 81 mg, 81 mg, Oral, Pre-Cath, Tolia, Sunit, DO   free water  500 mL, 500 mL, Oral, Once, Tolia, Sunit, DO   sodium chloride  flush (NS) 0.9 % injection 3 mL, 3 mL, Intravenous, Q12H, Tolia, Sunit, DO   sodium chloride  flush (NS) 0.9 % injection 3 mL, 3 mL, Intravenous, PRN, Tolia, Sunit, DO  Consent:   NA  Disposition:   3 months or sooner if needed.  Her questions and concerns were addressed to her satisfaction. She voices understanding of the recommendations provided during this encounter.    Signed, [name] HAS, [facility]  A Division of [facility] [address] [place], [place] [address]  [place], [place] [address] 04/06/2024 8:47 AM

## 2024-04-06 NOTE — Interval H&P Note (Signed)
 History and Physical Interval Note:  04/06/2024 8:47 AM  Marie Harrison  has presented today for surgery, with the diagnosis of chest pain - abnormal ct.  The various methods of treatment have been discussed with the patient and family. After consideration of risks, benefits and other options for treatment, the patient has consented to  Procedure(s): LEFT HEART CATH AND CORONARY ANGIOGRAPHY (N/A) as a surgical intervention.  The patient's history has been reviewed, patient examined, no change in status, stable for surgery.  I have reviewed the patient's chart and labs.  Questions were answered to the patient's satisfaction.     Jhony Antrim J [name]

## 2024-04-06 NOTE — Discharge Instructions (Signed)
Femoral Site Care ?This sheet gives you information about how to care for yourself after your procedure. Your health care provider may also give you more specific instructions. If you have problems or questions, contact your health care provider. ?What can I expect after the procedure? ? ?After the procedure, it is common to have: ?Bruising that usually fades within 1-2 weeks. ?Tenderness at the site. ?Follow these instructions at home: ?Wound care ?Follow instructions from your health care provider about how to take care of your insertion site. Make sure you: ?Wash your hands with soap and water before you change your bandage (dressing). If soap and water are not available, use hand sanitizer. ?Remove your dressing as told by your health care provider. In 24 hours ?Do not take baths, swim, or use a hot tub until your health care provider approves. ?You may shower 24-48 hours after the procedure or as told by your health care provider. ?Gently wash the site with plain soap and water. ?Pat the area dry with a clean towel. ?Do not rub the site. This may cause bleeding. ?Do not apply powder or lotion to the site. Keep the site clean and dry. ?Check your femoral site every day for signs of infection. Check for: ?Redness, swelling, or pain. ?Fluid or blood. ?Warmth. ?Pus or a bad smell. ?Activity ?For the first 2-3 days after your procedure, or as long as directed: ?Avoid climbing stairs as much as possible. ?Do not squat. ?Do not lift anything that is heavier than 10 lb (4.5 kg), or the limit that you are told, until your health care provider says that it is safe. For 5 days ?Rest as directed. ?Avoid sitting for a long time without moving. Get up to take short walks every 1-2 hours. ?Do not drive for 24 hours if you were given a medicine to help you relax (sedative). ?General instructions ?Take over-the-counter and prescription medicines only as told by your health care provider. ?Keep all follow-up visits as told by  your health care provider. This is important. ?Contact a health care provider if you have: ?A fever or chills. ?You have redness, swelling, or pain around your insertion site. ?Get help right away if: ?The catheter insertion area swells very fast. ?You pass out. ?You suddenly start to sweat or your skin gets clammy. ?The catheter insertion area is bleeding, and the bleeding does not stop when you hold steady pressure on the area. ?The area near or just beyond the catheter insertion site becomes pale, cool, tingly, or numb. ?These symptoms may represent a serious problem that is an emergency. Do not wait to see if the symptoms will go away. Get medical help right away. Call your local emergency services (911 in the [place] Do not drive yourself to the hospital. ?Summary ?After the procedure, it is common to have bruising that usually fades within 1-2 weeks. ?Check your femoral site every day for signs of infection. ?Do not lift anything that is heavier than 10 lb (4.5 kg), or the limit that you are told, until your health care provider says that it is safe. ?This information is not intended to replace advice given to you by your health care provider. Make sure you discuss any questions you have with your health care provider. ?Document Revised: 09/06/2017 Document Reviewed: 09/06/2017 ?Elsevier Patient Education ? [place]. ? ?Drink plenty of fluids for 48 hours and keep wrist elevated at heart level for 24 hours ? ?Radial Site Care ? ? ?This sheet gives you  information about how to care for yourself after your procedure. Your health care provider may also give you more specific instructions. If you have problems or questions, contact your health care provider. ?What can I expect after the procedure? ?After the procedure, it is common to have: ?Bruising and tenderness at the catheter insertion area. ?Follow these instructions at home: ?Medicines ?Take over-the-counter and prescription medicines only as told  by your health care provider. ?Insertion site care ?Follow instructions from your health care provider about how to take care of your insertion site. Make sure you: ?Wash your hands with soap and water before you change your bandage (dressing). If soap and water are not available, use hand sanitizer. ?Remove your dressing as told by your health care provider. In 24 hours ?Check your insertion site every day for signs of infection. Check for: ?Redness, swelling, or pain. ?Fluid or blood. ?Pus or a bad smell. ?Warmth. ?Do not take baths, swim, or use a hot tub until your health care provider approves. ?You may shower 24-48 hours after the procedure, or as directed by your health care provider. ?Remove the dressing and gently wash the site with plain soap and water. ?Pat the area dry with a clean towel. ?Do not rub the site. That could cause bleeding. ?Do not apply powder or lotion to the site. ?Activity ? ? ?For 24 hours after the procedure, or as directed by your health care provider: ?Do not flex or bend the affected arm. ?Do not push or pull heavy objects with the affected arm. ?Do not drive yourself home from the hospital or clinic. You may drive 24 hours after the procedure unless your health care provider tells you not to. ?Do not operate machinery or power tools. ?Do not lift anything that is heavier than 10 lb (4.5 kg), or the limit that you are told, until your health care provider says that it is safe.  For 4 days ?Ask your health care provider when it is okay to: ?Return to work or school. ?Resume usual physical activities or sports. ?Resume sexual activity. ?General instructions ?If the catheter site starts to bleed, raise your arm and put firm pressure on the site. If the bleeding does not stop, get help right away. This is a medical emergency. ?If you went home on the same day as your procedure, a responsible adult should be with you for the first 24 hours after you arrive home. ?Keep all follow-up  visits as told by your health care provider. This is important. ?Contact a health care provider if: ?You have a fever. ?You have redness, swelling, or yellow drainage around your insertion site. ?Get help right away if: ?You have unusual pain at the radial site. ?The catheter insertion area swells very fast. ?The insertion area is bleeding, and the bleeding does not stop when you hold steady pressure on the area. ?Your arm or hand becomes pale, cool, tingly, or numb. ?These symptoms may represent a serious problem that is an emergency. Do not wait to see if the symptoms will go away. Get medical help right away. Call your local emergency services (911 in the [place] Do not drive yourself to the hospital. ?Summary ?After the procedure, it is common to have bruising and tenderness at the site. ?Follow instructions from your health care provider about how to take care of your radial site wound. Check the wound every day for signs of infection. ?Do not lift anything that is heavier than 10 lb (4.5 kg), or the  limit that you are told, until your health care provider says that it is safe. ?This information is not intended to replace advice given to you by your health care provider. Make sure you discuss any questions you have with your health care provider. ?Document Revised: 09/29/2017 Document Reviewed: 09/29/2017 ?Elsevier Patient Education ? [place].  ? ?

## 2024-04-06 NOTE — Interval H&P Note (Signed)
 History and Physical Interval Note:  04/06/2024 10:18 AM  Marie Harrison  has presented today for surgery, with the diagnosis of chest pain - abnormal ct.  The various methods of treatment have been discussed with the patient and family. After consideration of risks, benefits and other options for treatment, the patient has consented to  Procedure(s): LEFT HEART CATH AND CORONARY ANGIOGRAPHY (N/A) as a surgical intervention.  The patient's history has been reviewed, patient examined, no change in status, stable for surgery.  I have reviewed the patient's chart and labs.  Questions were answered to the patient's satisfaction.     Koua Deeg J Haeden Hudock

## 2024-04-06 NOTE — Progress Notes (Signed)
 TR band removed at 1310, gauze dressing applied. Right radial, level 0, clean, dry, and intact.

## 2024-04-06 NOTE — Progress Notes (Signed)
 Patient walked to the bathroom without difficulties. Right groin level 0, clean, dry, and intact.

## 2024-04-11 NOTE — Progress Notes (Unsigned)
 Electrophysiology Office Note:   Date:  04/14/2024  ID:  Marie Harrison, DOB November 04, 1940, MRN [id number]  Primary Cardiologist: Madonna Large, DO Primary Heart Failure: None Electrophysiologist: OLE ONEIDA HOLTS, MD      History of Present Illness:   Marie Harrison is a 83 y.o. female with h/o HTN, palpitations/skipped beats, atypical chest pain seen today for routine electrophysiology followup.   She was seen by Dr. HOLTS 03/03/24 for palpitations and atypical chest pain.  Coronary CTA was ordered which showed CAC score of 1647 / 98th percentile for age/gender, LM with extensive plaque, visually appears to have moderate stenosis but not hemodynamically significant by CT FFR, extensive plaque in the proximal to mid LAD with severe stenosis that is hemodynamically significant by CT FFR, non-obs disease in the LCx, non-calcified plaque with mod stenosis in the proximal RCA - not hemodynamically significant by CT FFR but concern this could be higher risk plaque (non-calcified / soft).  Cardiac cath was recommended. Cardiac monitor also assessed which showed HR 50-176 bpm, ave 72 bpm, 112 SVT episodes, longest 56 minutes with an ave of 109 bpm, occ supraventricular ectopy (3%), rare ventricular ectopy. No sustained arrhythmias / AF. She underwent LHC on 04/06/24 and patient elected for medical management (with option to revisit mid LAD PCI) if further exertional chest pain. Metoprolol  was added to her amlodipine.  Since last being seen in our clinic the patient reports she has concerns over who will be monitoring her blood pressure. She continues to have occasional short bursts of a fast rhythm on Toprol .  She just started this medication on 8/1 > has been on it a week.  Her largest concern is her BP.     She denies chest pain, palpitations, dyspnea, PND, orthopnea, nausea, vomiting, dizziness, syncope, edema, weight gain, or early satiety.   Review of systems complete and found to be negative unless  listed in HPI.   EP Information / Studies Reviewed:    EKG is not ordered today. EKG from 03/30/24 reviewed which showed NSR 67 bpm       Risk Assessment/Calculations:             Physical Exam:   VS:  BP (!) 124/58 (BP Location: Left Arm, Patient Position: Sitting, Cuff Size: Normal)   Pulse 64   Ht 5' 1 (1.549 m)   Wt 152 lb (68.9 kg)   SpO2 94%   BMI 28.72 kg/m    Wt Readings from Last 3 Encounters:  04/14/24 152 lb (68.9 kg)  04/06/24 151 lb (68.5 kg)  03/30/24 155 lb 6.4 oz (70.5 kg)     GEN: Well nourished, well developed in no acute distress NECK: No JVD; No carotid bruits CARDIAC: Regular rate and rhythm, no murmurs, rubs, gallops RESPIRATORY:  Clear to auscultation without rales, wheezing or rhonchi  ABDOMEN: Soft, non-tender, non-distended EXTREMITIES:  No edema; No deformity   ASSESSMENT AND PLAN:    SVT  Palpitations  -continue Toprol  25 mg daily  -if persistent palpitations, can consider increasing Toprol  > discussed increasing with her in clinic today and she elects to hold for now. She wants to see Dr. Large first.  -Zio without AF / sustained arrhythmia, SVT only -daily short bursts of fluttering but nothing sustained   CAD  S/p LHC, pt elected for medical management with thought to revisit LAD PCI if recurrent chest pain  -no anginal symptoms    Hypertension  -BP trends 130-150 systolic by home numbers > in clinic BP  is excellent at 124/58.   -will hold further adjustment to see the impact of the Toprol  on her BP, anticipate it to be modest  -she was a bit confused about having two Cardiologists > requested I write down on her summary what each would manage  Follow up with Dr. Cindie offered follow up in 4 or 6 months and she wanted to wait to schedule. Will have our schedulers to follow up with  her in 4 months to offer an EP appt..    Signed, Daphne Barrack, NP-C, AGACNP-BC [facility] - Electrophysiology  04/14/2024, 1:14 PM

## 2024-04-13 ENCOUNTER — Telehealth: Payer: Self-pay | Admitting: Cardiology

## 2024-04-13 NOTE — Telephone Encounter (Signed)
 She checked her BP before taking her BP medication. She denies any other symptoms at this time.   She took BP medication about 9:00.   Instructed her to retake her blood pressure in 2 hours and let us  know what it is. Asked her to be resting for about 10 minutes before taking BP, make sure feet are flat on floor.   She verbalized understanding of instructions and will call back.

## 2024-04-13 NOTE — Telephone Encounter (Signed)
 Pt calling back after two hours to report her BP.  11:00 155/88

## 2024-04-13 NOTE — Telephone Encounter (Signed)
   Pt c/o BP issue: STAT if pt c/o blurred vision, one-sided weakness or slurred speech.  STAT if BP is GREATER than 180/120 TODAY.  STAT if BP is LESS than 90/60 and SYMPTOMATIC TODAY  1. What is your BP concern? Pt concerned her bp was high today   2. Have you taken any BP medication today? Yes she took about 5 minutes ago. She took bp before taking medication   3. What are your last 5 BP readings?   178/92 - this morning 172/85 - before bed last night   4. Are you having any other symptoms (ex. Dizziness, headache, blurred vision, passed out)? No

## 2024-04-13 NOTE — Telephone Encounter (Signed)
 Spent 20 min on phone with patient answering as much as I could.  Advised of Dr. Tyree recommendation.  Advised she should discuss/follow up with whomever is following/monitoring  her BPs.  Patient verbalized understanding and agreeable to plan.

## 2024-04-13 NOTE — Telephone Encounter (Signed)
 She may increase her amlodipine to 5 mg p.o. every morning. Recommended to take hydrochlorothiazide  in the morning and lisinopril  at night. She had similar blood pressures at last office visit. To my knowledge her benign essential hypertension is being managed by PCP.  Dr. Bodie Abernethy

## 2024-04-14 ENCOUNTER — Encounter: Payer: Self-pay | Admitting: Pulmonary Disease

## 2024-04-14 ENCOUNTER — Ambulatory Visit: Attending: Pulmonary Disease | Admitting: Pulmonary Disease

## 2024-04-14 VITALS — BP 124/58 | HR 64 | Ht 61.0 in | Wt 152.0 lb

## 2024-04-14 DIAGNOSIS — I471 Supraventricular tachycardia, unspecified: Secondary | ICD-10-CM | POA: Diagnosis not present

## 2024-04-14 DIAGNOSIS — R002 Palpitations: Secondary | ICD-10-CM

## 2024-04-14 DIAGNOSIS — I25119 Atherosclerotic heart disease of native coronary artery with unspecified angina pectoris: Secondary | ICD-10-CM

## 2024-04-14 NOTE — Patient Instructions (Addendum)
 Medication Instructions:  No medications changes today > we will continue your Toprol  dosing as currently prescribed.  Please note how frequently you are having palpitations. If you have improvement in symptoms that is exactly what we are looking for with the Toprol . If continued fast heart rates, we can discuss increasing the Toprol .    *If you need a refill on your cardiac medications before your next appointment, please call your pharmacy*  Lab Work: No lab work today If you have labs (blood work) drawn today and your tests are completely normal, you will receive your results only by: MyChart Message (if you have MyChart) OR A paper copy in the mail If you have any lab test that is abnormal or we need to change your treatment, we will call you to review the results.  Testing/Procedures: No testing/procedures were scheduled today  Follow-Up: At [facility], you and your health needs are our priority.  As part of our continuing mission to provide you with exceptional heart care, our providers are all part of one team.  This team includes your primary Cardiologist (physician) and Advanced Practice Providers or APPs (Physician Assistants and Nurse Practitioners) who all work together to provide you with the care you need, when you need it.  Your next appointment:   Pt offered a follow up appt with EP and she elects to see Dr. Michele first.  Ok to follow up PRN.    Provider:   You may see OLE ONEIDA HOLTS, MD or one of the following Advanced Practice Providers on your designated Care Team:    Daphne Barrack, NP     Note Dr. HOLTS is your heart rhythm physician and Dr. Michele will manage concerns such as blood pressure, cholesterol etc.      We recommend signing up for the patient portal called MyChart.  Sign up information is provided on this After Visit Summary.  MyChart is used to connect with patients for Virtual Visits (Telemedicine).  Patients are able to view lab/test  results, encounter notes, upcoming appointments, etc.  Non-urgent messages can be sent to your provider as well.   To learn more about what you can do with MyChart, go to [email or web address].

## 2024-05-09 ENCOUNTER — Other Ambulatory Visit (HOSPITAL_COMMUNITY): Payer: Self-pay

## 2024-05-12 ENCOUNTER — Encounter: Payer: Self-pay | Admitting: Cardiology

## 2024-05-12 ENCOUNTER — Other Ambulatory Visit: Payer: Self-pay | Admitting: *Deleted

## 2024-05-12 ENCOUNTER — Ambulatory Visit: Attending: Cardiology | Admitting: Cardiology

## 2024-05-12 VITALS — BP 124/48 | HR 68 | Ht 61.0 in | Wt 154.8 lb

## 2024-05-12 DIAGNOSIS — R931 Abnormal findings on diagnostic imaging of heart and coronary circulation: Secondary | ICD-10-CM | POA: Diagnosis not present

## 2024-05-12 DIAGNOSIS — I2510 Atherosclerotic heart disease of native coronary artery without angina pectoris: Secondary | ICD-10-CM | POA: Diagnosis not present

## 2024-05-12 DIAGNOSIS — R0683 Snoring: Secondary | ICD-10-CM

## 2024-05-12 DIAGNOSIS — Z79899 Other long term (current) drug therapy: Secondary | ICD-10-CM

## 2024-05-12 DIAGNOSIS — I471 Supraventricular tachycardia, unspecified: Secondary | ICD-10-CM | POA: Diagnosis not present

## 2024-05-12 DIAGNOSIS — R002 Palpitations: Secondary | ICD-10-CM

## 2024-05-12 DIAGNOSIS — I10 Essential (primary) hypertension: Secondary | ICD-10-CM

## 2024-05-12 DIAGNOSIS — E782 Mixed hyperlipidemia: Secondary | ICD-10-CM

## 2024-05-12 MED ORDER — METOPROLOL SUCCINATE ER 25 MG PO TB24: 25.0000 mg | ORAL_TABLET | Freq: Every morning | ORAL | 3 refills | Status: AC

## 2024-05-12 MED ORDER — EZETIMIBE 10 MG PO TABS: 10.0000 mg | ORAL_TABLET | Freq: Every day | ORAL | 3 refills | Status: AC

## 2024-05-12 NOTE — Patient Instructions (Signed)
 Medication Instructions:  Please start Zetia  10 mg a day. Take Metoprolol  Succiante in the mornings. Continue all other medications as listed.  *If you need a refill on your cardiac medications before your next appointment, please call your pharmacy*  Lab Work: Please have fasting blood work in 3 months (Lipid/CMP)  If you have labs (blood work) drawn today and your tests are completely normal, you will receive your results only by: MyChart Message (if you have MyChart) OR A paper copy in the mail If you have any lab test that is abnormal or we need to change your treatment, we will call you to review the results.  Testing/Procedures: You have been ordered to have an Itamar Sleep study and will be contacted regarding instructions for this.  Follow-Up: At [facility], you and your health needs are our priority.  As part of our continuing mission to provide you with exceptional heart care, our providers are all part of one team.  This team includes your primary Cardiologist (physician) and Advanced Practice Providers or APPs (Physician Assistants and Nurse Practitioners) who all work together to provide you with the care you need, when you need it.  Your next appointment:   6 month(s)  Provider:   Madonna Large, DO    We recommend signing up for the patient portal called MyChart.  Sign up information is provided on this After Visit Summary.  MyChart is used to connect with patients for Virtual Visits (Telemedicine).  Patients are able to view lab/test results, encounter notes, upcoming appointments, etc.  Non-urgent messages can be sent to your provider as well.   To learn more about what you can do with MyChart, go to [email or web address].

## 2024-05-12 NOTE — Progress Notes (Signed)
 Cardiology Office Note:  .   ID:  Marie Harrison, DOB [birth year], MRN [id number] PCP:  Feliciano Devoria LABOR, MD  [facility] Providers Cardiologist:  Madonna Large, DO Electrophysiologist:  OLE ONEIDA HOLTS, MD  Electrophysiologist:  OLE ONEIDA HOLTS, MD  Click to update primary MD,subspecialty MD or APP then REFRESH:1}    Chief Complaint  Patient presents with   Follow-up    CAD and palpitations    History of Present Illness: .   Marie Harrison is a 83 y.o. Caucasian female whose past medical history and cardiovascular risk factors includes: Severe coronary artery calcification, multivessel coronary artery disease, hypertension, hyperlipidemia, IBS, tremors.  Patient was last seen by Dr. OLE HOLTS in consultation for palpitations back on March 03, 2024.  In addition to palpitations she also endorsed chest pain.  Given her precordial discomfort and advanced age with multiple risk factors coronary CTA was ordered.  Coronary CTA noted multivessel CAD with CT FFR concerning for hemodynamically significant stenosis.  Patient was scheduled for left heart catheterization and she underwent procedure on 04/06/2024.  Heart catheterization report reviewed.  Patient is noted to have LAD disease and the shared decision between the patient and interventional cardiology was to treat medically by uptitration of antianginal therapy and if she is refractory to medical therapy coronary interventions could be considered.  She was on amlodipine 2.5 mg p.o. daily and was started on Toprol -XL 25 mg p.o. daily.  Patient presents today for follow-up.  She experiences morning palpitations with a pounding heartbeat and dry mouth, accompanied by high blood pressure readings (i.e. 155 mmHg). She takes metoprolol , lisinopril , hydrochlorothiazide , and amlodipine for blood pressure control. She has not had recent chest pain or palpitations outside of the morning episodes.  Her LDL level was 66 mg/dL in June 2025  see care Everywhere, managed with long-term lovastatin  use. She has been on blood pressure medications for over twenty years, with amlodipine added in the last year and metoprolol  following her catheterization.  Review of Systems: .   Review of Systems  Cardiovascular:  Positive for chest pain (epigastric). Negative for claudication, irregular heartbeat, leg swelling, near-syncope, orthopnea, palpitations, paroxysmal nocturnal dyspnea and syncope.  Respiratory:  Negative for shortness of breath.   Hematologic/Lymphatic: Negative for bleeding problem.    Studies Reviewed:   CCTA 03/23/2024 1. Coronary artery calcium score 1647 Agatston units. This places the patient in the 98th percentile for age and gender.   2. The left main has extensive plaque, visually appears to have moderate stenosis but is not hemodynamically significant by CT FFR.   3. Extensive plaque in the proximal to mid LAD with severe stenosis that is hemodynamically significant by CT FFR.   4.  Nonobstructive disease in the LCx.   5. Noncalcified plaque with visually moderate stenosis in the proximal RCA. This is not hemodynamically significant by CT FFR but I worry this could be a higher risk plaque (noncalcified/soft).   Consider cardiac catheterization.  Coronary angiography 04/06/2024: LM: Distal 30% disease LAD: Mid LAD focal 75% stenosis Lcx: No significant disease RCA: Large dominant vessel. Mild diffuse disease   LVEDP 21 mmHg      Diagnostic angiogram showed mild distal left main and moderate to severe mid LAD disease.  There was no proximal LAD critical stenosis.  At the beginning of the case, I discussed with the patient regarding a preference for medical management versus stenting if both are acceptable treatment options.  Patient had mentioned that she would prefer medical  management.  Given that the lesion is not in proximal LAD and is about 75% stenosed, although hemodynamically significant on CT FFR,  it is reasonable to try medical management first.  She is only on 2.5 mg amlodipine as the Lowne antianginal agent.  Recommend adding metoprolol  succinate 25 mg daily and uptitrate amlodipine as tolerated.  Patient's blood pressure was very high today.  I do think she will tolerate medical therapy well.  If she continues to have exertional chest pain symptoms in spite of optimal medical therapy, we can revisit discussion regarding mid LAD PCI.   Minimal Rt groin hematoma was noted post Mynx closure but improved with manual pressure. No further intervention needed for this. Recommend 4 hr bed rest.  Cardiac monitor: HR 50 to 176, average 72. 112 SVT episodes, longest 56 minutes with an average rate of 109 bpm. Occasional supraventricular ectopy, 3% Rare ventricular ectopy. No sustained arrhythmias. No atrial fibrillation.  RADIOLOGY: NA  Risk Assessment/Calculations:   NA   Labs:       Latest Ref Rng & Units 03/31/2024    2:43 PM 05/15/2020    7:17 AM 04/28/2016    2:36 PM  CBC  WBC 3.4 - 10.8 x10E3/uL 10.7   17.9   Hemoglobin 11.1 - 15.9 g/dL 88.0  87.0  86.9   Hematocrit 34.0 - 46.6 % 37.4  38.0  38.2   Platelets 150 - 450 x10E3/uL 371   394        Latest Ref Rng & Units 03/31/2024    2:43 PM 05/15/2020    7:17 AM 04/28/2016    2:36 PM  BMP  Glucose 70 - 99 mg/dL 894  893  872   BUN 8 - 27 mg/dL 22  10  13    Creatinine 0.57 - 1.00 mg/dL 9.31  9.49  9.44   BUN/Creat Ratio 12 - 28 32     Sodium 134 - 144 mmol/L 132  135  130   Potassium 3.5 - 5.2 mmol/L 4.1  3.6  3.4   Chloride 96 - 106 mmol/L 93  95  92   CO2 20 - 29 mmol/L 22   30   Calcium 8.7 - 10.3 mg/dL 9.3   9.3       Latest Ref Rng & Units 03/31/2024    2:43 PM 05/15/2020    7:17 AM 04/28/2016    2:36 PM  CMP  Glucose 70 - 99 mg/dL 894  893  872   BUN 8 - 27 mg/dL 22  10  13    Creatinine 0.57 - 1.00 mg/dL 9.31  9.49  9.44   Sodium 134 - 144 mmol/L 132  135  130   Potassium 3.5 - 5.2 mmol/L 4.1  3.6  3.4    Chloride 96 - 106 mmol/L 93  95  92   CO2 20 - 29 mmol/L 22   30   Calcium 8.7 - 10.3 mg/dL 9.3   9.3   Total Protein 6.0 - 8.5 g/dL 6.9   7.6   Total Bilirubin 0.0 - 1.2 mg/dL 0.3   1.1   Alkaline Phos 44 - 121 IU/L 114   56   AST 0 - 40 IU/L 24   49   ALT 0 - 32 IU/L 37   36       Physical Exam:    Today's Vitals   05/12/24 0939  BP: (!) 124/48  Pulse: 68  SpO2: 95%  Weight: 154 lb 12.8 oz (  70.2 kg)  Height: 5' 1 (1.549 m)    Body mass index is 29.25 kg/m. Wt Readings from Last 3 Encounters:  05/12/24 154 lb 12.8 oz (70.2 kg)  04/14/24 152 lb (68.9 kg)  04/06/24 151 lb (68.5 kg)    Physical Exam  Constitutional: No distress.  hemodynamically stable  Neck: No JVD present.  Cardiovascular: Normal rate, regular rhythm, S1 normal and S2 normal. Exam reveals no gallop, no S3 and no S4.  No murmur heard. Pulmonary/Chest: Effort normal and breath sounds normal. No stridor. She has no wheezes. She has no rales.  Musculoskeletal:        General: No edema.     Cervical back: Neck supple.  Skin: Skin is warm.     Impression & Recommendation(s):  Impression:   ICD-10-CM   1. Atherosclerosis of native coronary artery of native heart without angina pectoris  I25.10     2. Agatston CAC score, >400  R93.1     3. PSVT (paroxysmal supraventricular tachycardia) (HCC)  I47.10     4. Palpitations  R00.2     5. Essential hypertension  I10 Comprehensive metabolic panel with GFR    6. Mixed hyperlipidemia  E78.2 Comprehensive metabolic panel with GFR    Lipid panel    7. Medication management  Z79.899 Comprehensive metabolic panel with GFR    Lipid panel    8. Snoring  R06.83 Itamar Sleep Study        Recommendation(s):  Atherosclerosis of native coronary artery of native heart without angina pectoris Agatston CAC score, >400 No reoccurrence of chest pain or heart failure symptoms. Coronary CTA-severe CAC and hemodynamically significant stenosis. Left heart  catheterization: LAD disease, shared decision with the patient and interventional cardiology was to treat medically with uptitration of antianginal therapy.  If refractory to medical therapy staged intervention can be considered. Antianginal therapies: Amlodipine, metoprolol , sublingual nitroglycerin  tablets No use of sublingual nitroglycerin  tablets since the last office visit. Continue aspirin  81 mg p.o. daily. Continue lovastatin  40 mg p.o. nightly LDL currently 66 mg/dL as of June 2025, Care Everywhere Start Zetia  10 mg p.o. daily, recommend a goal LDL of 55 mg/dL Patient is advised to seek medical attention if she has recurrence of chest pain given her underlying CAD. Will hold off on uptitration of antianginal therapy as she is currently asymptomatic.  PSVT (paroxysmal supraventricular tachycardia) (HCC) Palpitations Cardiac monitor results reviewed. Currently on metoprolol  25 mg p.o. every morning, medications refilled  Essential hypertension Office blood pressures are well-controlled. Continue lisinopril  40 mg p.o. every afternoon. Continue amlodipine 5 mg p.o. every afternoon. Continue Toprol -XL 25 mg p.o. every morning. Continue to chlorothiazide 25 mg p.o. every morning. Blood pressure currently being managed by PCP Reemphasized importance of low-salt diet.  Mixed hyperlipidemia Currently on lovastatin  40 mg p.o. nightly. Add Zetia  as discussed above Fasting lipids and CMP in 6 weeks  Patient has morning headaches, frequent nocturia, severe dry mouth in the morning, elevated blood pressures in the morning.  Recommended sleep apnea evaluation.  Patient agreeable  will order Itamar Study.  Orders Placed:  Orders Placed This Encounter  Procedures   Comprehensive metabolic panel with GFR    Standing Status:   Future    Number of Occurrences:   1    Expiration Date:   05/12/2025   Lipid panel    Standing Status:   Future    Number of Occurrences:   1    Expected Date:    08/11/2024  Expiration Date:   05/12/2025   Itamar Sleep Study    Standing Status:   Future    Expected Date:   05/12/2024    Expiration Date:   05/12/2025    Does the patient have access to and comfortable using a smartphone or tablet?:   Yes - WatchPat One. Device is disposable     Final Medication List:    Meds ordered this encounter  Medications   metoprolol  succinate (TOPROL -XL) 25 MG 24 hr tablet    Sig: Take 1 tablet (25 mg total) by mouth every morning. Take with or immediately following a meal.    Dispense:  90 tablet    Refill:  3   ezetimibe  (ZETIA ) 10 MG tablet    Sig: Take 1 tablet (10 mg total) by mouth daily.    Dispense:  90 tablet    Refill:  3    Medications Discontinued During This Encounter  Medication Reason   metoprolol  succinate (TOPROL -XL) 25 MG 24 hr tablet Reorder      Current Outpatient Medications:    acetaminophen  (TYLENOL ) 500 MG tablet, Take 500 mg by mouth every 6 (six) hours as needed for moderate pain (pain score 4-6)., Disp: , Rfl:    amLODipine (NORVASC) 2.5 MG tablet, Take 2.5 mg by mouth daily. (Patient taking differently: Take 5 mg by mouth daily.), Disp: , Rfl:    aspirin  EC 81 MG tablet, Take 81 mg by mouth daily. Swallow whole., Disp: , Rfl:    Cholecalciferol 125 MCG (5000 UT) TABS, Take 5,000 Units by mouth daily., Disp: , Rfl:    Cyanocobalamin 1000 MCG SUBL, Take 1,000 tablets by mouth daily., Disp: , Rfl:    ezetimibe  (ZETIA ) 10 MG tablet, Take 1 tablet (10 mg total) by mouth daily., Disp: 90 tablet, Rfl: 3   fexofenadine (ALLEGRA ALLERGY) 180 MG tablet, Take 180 mg by mouth daily., Disp: , Rfl:    FLUoxetine  (PROZAC ) 40 MG capsule, Take 40 mg by mouth daily., Disp: , Rfl:    fluticasone  (FLONASE ) 50 MCG/ACT nasal spray, Place 1 spray into both nostrils daily as needed for allergies or rhinitis., Disp: , Rfl:    hydrochlorothiazide  (HYDRODIURIL ) 25 MG tablet, Take 1 tablet (25 mg total) by mouth every morning., Disp: 90 tablet, Rfl: 3    ibuprofen  (ADVIL ) 200 MG tablet, Take 200 mg by mouth every 6 (six) hours as needed (Arthritis)., Disp: , Rfl:    lisinopril  (ZESTRIL ) 40 MG tablet, Take 40 mg by mouth daily., Disp: , Rfl:    lovastatin  (MEVACOR ) 40 MG tablet, Take 1 tablet (40 mg total) by mouth daily with supper., Disp: 90 tablet, Rfl: 3   METAMUCIL 0.36 g CAPS, Take 1-2 capsules by mouth daily., Disp: , Rfl:    nitroGLYCERIN  (NITROSTAT ) 0.4 MG SL tablet, Place 1 tablet (0.4 mg total) under the tongue every 5 (five) minutes as needed for chest pain. If you have to take 1 tablet for chest pain, go to the emergency room., Disp: 25 tablet, Rfl: 0   omeprazole  (PRILOSEC) 20 MG capsule, Take 20 mg by mouth daily. (Patient taking differently: Take 20 mg by mouth daily as needed (indigestion).), Disp: , Rfl:    potassium chloride  20 MEQ/15ML (10%) SOLN, Take 10 mEq by mouth daily as needed (itching)., Disp: , Rfl:    triamcinolone  cream (KENALOG ) 0.1 %, Apply 1 application topically daily as needed. , Disp: , Rfl:    metoprolol  succinate (TOPROL -XL) 25 MG 24 hr tablet, Take 1  tablet (25 mg total) by mouth every morning. Take with or immediately following a meal., Disp: 90 tablet, Rfl: 3  Consent:   NA  Disposition:   6 months or sooner if needed.  Her questions and concerns were addressed to her satisfaction. She voices understanding of the recommendations provided during this encounter.    Signed, [name] HAS, [facility]  A Division of [facility] [address], [place], [place] [address]  05/12/2024

## 2024-05-24 ENCOUNTER — Other Ambulatory Visit (HOSPITAL_COMMUNITY): Payer: Self-pay

## 2024-05-24 ENCOUNTER — Other Ambulatory Visit: Payer: Self-pay

## 2024-06-07 ENCOUNTER — Other Ambulatory Visit (HOSPITAL_BASED_OUTPATIENT_CLINIC_OR_DEPARTMENT_OTHER): Payer: Self-pay | Admitting: Hematology and Oncology

## 2024-06-07 ENCOUNTER — Other Ambulatory Visit: Payer: Self-pay

## 2024-06-07 ENCOUNTER — Ambulatory Visit (HOSPITAL_BASED_OUTPATIENT_CLINIC_OR_DEPARTMENT_OTHER)
Admission: RE | Admit: 2024-06-07 | Discharge: 2024-06-07 | Disposition: A | Discharge status: Home / Self Care | Source: Ambulatory Visit | Attending: Hematology and Oncology | Admitting: Hematology and Oncology

## 2024-06-07 ENCOUNTER — Emergency Department (HOSPITAL_BASED_OUTPATIENT_CLINIC_OR_DEPARTMENT_OTHER)
Admission: EM | Admit: 2024-06-07 | Discharge: 2024-06-07 | Disposition: A | Discharge status: Other Acute Inpatient Hospital

## 2024-06-07 ENCOUNTER — Encounter (HOSPITAL_BASED_OUTPATIENT_CLINIC_OR_DEPARTMENT_OTHER): Payer: Self-pay | Admitting: Hematology and Oncology

## 2024-06-07 DIAGNOSIS — M7989 Other specified soft tissue disorders: Secondary | ICD-10-CM

## 2024-06-07 DIAGNOSIS — M25431 Effusion, right wrist: Secondary | ICD-10-CM

## 2024-06-07 DIAGNOSIS — W010XXA Fall on same level from slipping, tripping and stumbling without subsequent striking against object, initial encounter: Secondary | ICD-10-CM | POA: Diagnosis not present

## 2024-06-07 DIAGNOSIS — M79601 Pain in right arm: Secondary | ICD-10-CM | POA: Diagnosis present

## 2024-06-07 DIAGNOSIS — M1811 Unilateral primary osteoarthritis of first carpometacarpal joint, right hand: Secondary | ICD-10-CM | POA: Diagnosis not present

## 2024-06-07 DIAGNOSIS — M19041 Primary osteoarthritis, right hand: Secondary | ICD-10-CM | POA: Insufficient documentation

## 2024-06-07 DIAGNOSIS — M25511 Pain in right shoulder: Secondary | ICD-10-CM | POA: Insufficient documentation

## 2024-06-08 ENCOUNTER — Other Ambulatory Visit (HOSPITAL_BASED_OUTPATIENT_CLINIC_OR_DEPARTMENT_OTHER): Payer: Self-pay | Admitting: Hematology and Oncology

## 2024-06-08 DIAGNOSIS — M25511 Pain in right shoulder: Secondary | ICD-10-CM

## 2024-06-10 ENCOUNTER — Ambulatory Visit (HOSPITAL_BASED_OUTPATIENT_CLINIC_OR_DEPARTMENT_OTHER)

## 2024-06-12 ENCOUNTER — Ambulatory Visit (HOSPITAL_BASED_OUTPATIENT_CLINIC_OR_DEPARTMENT_OTHER)
Admission: RE | Admit: 2024-06-12 | Discharge: 2024-06-12 | Disposition: A | Discharge status: Home / Self Care | Source: Ambulatory Visit | Attending: Hematology and Oncology | Admitting: Hematology and Oncology

## 2024-06-12 DIAGNOSIS — M25511 Pain in right shoulder: Secondary | ICD-10-CM | POA: Insufficient documentation

## 2024-07-07 ENCOUNTER — Telehealth: Payer: Self-pay | Admitting: Cardiology

## 2024-07-07 NOTE — Telephone Encounter (Signed)
**Note De-Identified Marie Harrison Obfuscation** The pt was not aware that the [employer] has been trying to communicate with her Marie Harrison text messages through her cell phone.  She did pull up the text messages while we were on the phone and she did have a text message from them with her PIN # of [id number] (the last 4 digits of her cell phone number).  I did give the pt the phone number to reach the [employer] incase she has any issues and needs assistance when she is ready to do her sleep test.  She verbalized understanding and stated that she plans to do her study tonight. She thanked me for my assistance.

## 2024-07-07 NOTE — Telephone Encounter (Signed)
**Note De-Identified Aleathia Purdy Obfuscation** [employer] must have mailed the Itamar-HST Device to the pt. and they use the last 4 digits of the pts cell phone number as the WatchPAT One-HST PIN #.  I called the pt but got no answer so I left a message asking her to call Macario back at [phone].

## 2024-07-07 NOTE — Telephone Encounter (Signed)
 Pt was sent home with a Home Sleep Apnea test and test is requiring a pin code.Please Advise

## 2024-07-07 NOTE — Telephone Encounter (Signed)
 Patient is returning your call. Please advise. ?

## 2024-07-11 ENCOUNTER — Ambulatory Visit: Admitting: Cardiology

## 2024-08-09 LAB — LAB REPORT - SCANNED: A1c: 6.1

## 2024-09-04 ENCOUNTER — Ambulatory Visit: Payer: Self-pay | Admitting: Cardiology

## 2024-09-14 ENCOUNTER — Other Ambulatory Visit (HOSPITAL_BASED_OUTPATIENT_CLINIC_OR_DEPARTMENT_OTHER): Payer: Self-pay | Admitting: Hematology and Oncology

## 2024-09-14 DIAGNOSIS — M2560 Stiffness of unspecified joint, not elsewhere classified: Secondary | ICD-10-CM

## 2024-09-14 DIAGNOSIS — M25512 Pain in left shoulder: Secondary | ICD-10-CM

## 2024-09-19 ENCOUNTER — Ambulatory Visit (HOSPITAL_BASED_OUTPATIENT_CLINIC_OR_DEPARTMENT_OTHER)
Admission: RE | Admit: 2024-09-19 | Discharge: 2024-09-19 | Disposition: A | Discharge status: Home / Self Care | Source: Ambulatory Visit | Attending: Hematology and Oncology | Admitting: Hematology and Oncology

## 2024-09-19 DIAGNOSIS — M25512 Pain in left shoulder: Secondary | ICD-10-CM | POA: Insufficient documentation

## 2024-09-19 DIAGNOSIS — M2560 Stiffness of unspecified joint, not elsewhere classified: Secondary | ICD-10-CM | POA: Insufficient documentation

## 2024-10-03 NOTE — Telephone Encounter (Signed)
 Left detailed message on home machine per DPR. Relayed Dr. Tyree note. All concerns addressed. Attempted to contact both daughter and son at respective numbers, [facility]

## 2024-10-03 NOTE — Telephone Encounter (Signed)
-----   Message from [place], [place] sent at 10/01/2024  4:04 PM EST ----- Order a fasting Lipid and CMP to be done 48hr before the next visit to re-evaluate her lipids.  Hold Zetia  for now due to her hx of AST/ALT levels.   Dr. Michele ----- Message ----- From: Cecelia Harman SAUNDERS, CMA Sent: 09/15/2024  11:47 AM EST To: [name], DO  New Lab report available from 09/05/24. Shows ALT, ALP WNL. Elevated BUN/Creat. Ratio. Continue to hold Zetia ? ----- Message ----- From: [name], DO Sent: 09/04/2024   4:56 PM EST To: Harman SAUNDERS Cecelia, CMA  Ms. Marie Harrison Outside labs provided by your primary care were reviewed.  Liver enzymes were elevated and based on the documentation PCP noted it could be secondary to Zetia .  Can hold Zetia  would recommend repeating labs to make sure they are within normal limits.  Recommendations based on the results will be deferred to the ordering provider. These labs are noted for reference and we will review them at the next office visit.    Regards,   Dr. Michele

## 2024-10-04 NOTE — Telephone Encounter (Signed)
 Spoke with patient. Relayed Dr. Tyree note. Patient said she would have lab work drawn at there ALF and bring with her to the appointment in March. All concerns addressed.

## 2024-10-04 NOTE — Telephone Encounter (Signed)
-----   Message from [place], [place] sent at 10/01/2024  4:04 PM EST ----- Order a fasting Lipid and CMP to be done 48hr before the next visit to re-evaluate her lipids.  Hold Zetia  for now due to her hx of AST/ALT levels.   Dr. Michele ----- Message ----- From: Marie Harrison, CMA Sent: 09/15/2024  11:47 AM EST To: [name], DO  New Lab report available from 09/05/24. Shows ALT, ALP WNL. Elevated BUN/Creat. Ratio. Continue to hold Zetia ? ----- Message ----- From: [name], DO Sent: 09/04/2024   4:56 PM EST To: Harman Harrison Marie, CMA  Ms. Marie Harrison Outside labs provided by your primary care were reviewed.  Liver enzymes were elevated and based on the documentation PCP noted it could be secondary to Zetia .  Can hold Zetia  would recommend repeating labs to make sure they are within normal limits.  Recommendations based on the results will be deferred to the ordering provider. These labs are noted for reference and we will review them at the next office visit.    Regards,   Dr. Michele

## 2024-10-06 ENCOUNTER — Ambulatory Visit: Admitting: Orthopedic Surgery

## 2024-10-06 ENCOUNTER — Encounter: Payer: Self-pay | Admitting: Orthopedic Surgery

## 2024-10-06 DIAGNOSIS — M75122 Complete rotator cuff tear or rupture of left shoulder, not specified as traumatic: Secondary | ICD-10-CM

## 2024-10-06 NOTE — Progress Notes (Signed)
 "  Office Visit Note   Patient: Marie Harrison           Date of Birth: Sep 03, 1941           MRN: [id number] Visit Date: 10/06/2024 Requested by: Feliciano Devoria LABOR, MD [address] [place],  [place] [address] PCP: Feliciano Devoria LABOR, MD  Subjective: Chief Complaint  Patient presents with   Left Shoulder - Pain    HPI: Marie Harrison is a 84 y.o. female who presents to the office reporting left shoulder pain of several months duration.  Does report occasional neck pain.  Initially she had significant symptoms but she has really significantly improved.  Prednisone  helped that which was a 6-day course.  She really does not describe any functional limitation at this time.  She does live in a retirement facility.  Patient describes having a fall 2 months ago which precipitated her symptoms.  MRI scan of the left shoulder was reviewed.  Does show retracted rotator cuff tear of the supraspinatus without much arthritis and with intact anterior posterior rotator cuff..                ROS: All systems reviewed are negative as they relate to the chief complaint within the history of present illness.  Patient denies fevers or chills.  Assessment & Plan: Visit Diagnoses:  1. Nontraumatic complete tear of left rotator cuff     Plan: Impression is functional shoulder despite rotator cuff tear of the supraspinatus.  If her pain recurs she would be a good candidate for intra-articular glenohumeral joint injection.  She will continue to the judicious and deliberate with lifting with that left arm.  She will follow-up with us  as needed.  Follow-Up Instructions: No follow-ups on file.   Orders:  No orders of the defined types were placed in this encounter.  No orders of the defined types were placed in this encounter.     Procedures: No procedures performed   Clinical Data: No additional findings.  Objective: Vital Signs: There were no vitals taken for this visit.  Physical Exam:   Constitutional: Patient appears well-developed HEENT:  Head: Normocephalic Eyes:EOM are normal Neck: Normal range of motion Cardiovascular: Normal rate Pulmonary/chest: Effort normal Neurologic: Patient is alert Skin: Skin is warm Psychiatric: Patient has normal mood and affect  Ortho Exam: Ortho exam demonstrates bilateral shoulder range of motion passive of 60/95/160.  Does have pretty good internal and external rotation strength at 15 degrees of abduction on the left.  Some coarseness on the left but not on the right with internal and external rotation at 90 degrees of abduction.  Deltoid fires.  No discrete AC joint tenderness.  Slight Popeye deformity is present left versus right.  Motor or sensory function to the left hand is intact.  Specialty Comments:  No specialty comments available.  Imaging: No results found.   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   Hyponatremia 05/01/2014   Hypokalemia 05/01/2014   OA (osteoarthritis) of knee 04/30/2014   Dyshidrotic eczema 01/23/2014   Acute medial meniscal tear 12/06/2012   Allergic rhinitis due to pollen 02/11/2012   Osteoarthritis 02/11/2012   Viral URI 11/02/2011   Asthma, mild 11/02/2011   LEUKOPLAKIA, ORAL MUCOSA 03/18/2010   CONTACT DERMATITIS&OTHER ECZEMA DUE DETERGENTS 10/01/2009   Hyperlipidemia 08/06/2009   Depression 08/06/2009   Essential hypertension  08/06/2009   GERD 08/06/2009   URINARY INCONTINENCE 08/06/2009   CEREBROVASCULAR ACCIDENT, HX OF 08/06/2009   Past Medical History:  Diagnosis Date   Allergic rhinitis    Anxiety    Arthritis    Asthma    mild no inhaler use   Bladder tumor    Depression    Eczema    none currently   Fibromyalgia    GERD (gastroesophageal reflux disease)    History of basal cell carcinoma excision    x6  face/ head   History of  CVA (cerebrovascular accident)    Remote CVA per ct 2009   no residual deficits   History of recurrent UTI (urinary tract infection)    Hyperlipidemia    Hypertension    IBS (irritable bowel syndrome)    Osteoporosis    Patellar clunk syndrome of right knee    Pollen allergies    PONV (postoperative nausea and vomiting)    SUI (stress urinary incontinence, female)    Wears partial dentures    upper and lower    Family History  Problem Relation Age of Onset   Cancer Mother        pancreatic    Diabetes Father    Heart disease Father    Stroke Father    Colon cancer Neg Hx    Esophageal cancer Neg Hx    Stomach cancer Neg Hx     Past Surgical History:  Procedure Laterality Date   CATARACT EXTRACTION W/ INTRAOCULAR LENS  IMPLANT, BILATERAL  2001  &  1999   DILATATION & CURRETTAGE/HYSTEROSCOPY WITH RESECTOCOPE N/A 11/28/2014   Procedure: DILATATION & CURETTAGE/HYSTEROSCOPY WITH RESECTOCOPE;  Surgeon: Dickie Carder, MD;  Location: [facility];  Service: Gynecology;  Laterality: N/A;   FOOT SURGERY Right 1955   removal of bone spur   JOINT REPLACEMENT     KNEE ARTHROSCOPY Right 12/07/2012   Procedure: RIGHT KNEE ARTHROSCOPY WITH DEBRIDEMENT AND PARTIAL MEDIAL MENISECTOMY  AND CHONDROPLASTY;  Surgeon: Dempsey LULLA Moan, MD;  Location: [facility];  Service: Orthopedics;  Laterality: Right;   KNEE ARTHROSCOPY Left 2001   KNEE ARTHROSCOPY Right 10/02/2015   Procedure: ARTHROSCOPY RIGHT KNEE WITH SYNOVECTOMY;  Surgeon: Dempsey Moan, MD;  Location[facility];  Service: Orthopedics;  Laterality: Right;   LEFT HEART CATH AND CORONARY ANGIOGRAPHY N/A 04/06/2024   Procedure: LEFT HEART CATH AND CORONARY ANGIOGRAPHY;  Surgeon: Elmira Newman PARAS, MD;  Location: [facility];  Service: Cardiovascular;  Laterality: N/A;   TONSILLECTOMY  child   TOTAL KNEE ARTHROPLASTY Right 04/30/2014   Procedure: RIGHT TOTAL KNEE ARTHROPLASTY;  Surgeon: Dempsey Moan LULLA, MD;  Location: [facility];   Service: Orthopedics;  Laterality: Right;   TOTAL KNEE ARTHROPLASTY Left 03/02/2016   Procedure: TOTAL LEFT KNEE ARTHROPLASTY;  Surgeon: Dempsey Moan, MD;  Location: [facility];  Service: Orthopedics;  Laterality: Left;   TRANSURETHRAL RESECTION OF BLADDER TUMOR WITH MITOMYCIN -C N/A 05/15/2020   Procedure: TRANSURETHRAL RESECTION OF BLADDER TUMOR WITH GEMCITABINE ;  Surgeon: Carolee Sherwood JONETTA DOUGLAS, MD;  Location: [facility];  Service: Urology;  Laterality: N/A;   Social History   Occupational History   Occupation: retired  Tobacco Use   Smoking status: Former    Current packs/day: 0.00    Average packs/day: 1 pack/day for 6.0 years (6.0 ttl pk-yrs)    Types: Cigarettes    Start date: 09/23/1972    Quit date: 09/23/1978    Years since quitting: 70.0  Smokeless tobacco: Never  Vaping Use   Vaping status: Never Used  Substance and Sexual Activity   Alcohol use: Yes    Comment: i beer every 2 weeks   Drug use: No   Sexual activity: Not Currently        "

## 2024-11-15 ENCOUNTER — Ambulatory Visit: Admitting: Cardiology
